# Patient Record
Sex: Female | Born: 1965 | Race: White | Marital: Married | Smoking: Never smoker
Health system: Southern US, Community
[De-identification: ages and names within clinical notes are randomized; demographics above are authoritative.]

## PROBLEM LIST (undated history)

## (undated) DIAGNOSIS — E119 Type 2 diabetes mellitus without complications: Secondary | ICD-10-CM

## (undated) DIAGNOSIS — M1990 Unspecified osteoarthritis, unspecified site: Secondary | ICD-10-CM

## (undated) DIAGNOSIS — I10 Essential (primary) hypertension: Secondary | ICD-10-CM

## (undated) DIAGNOSIS — G4730 Sleep apnea, unspecified: Secondary | ICD-10-CM

## (undated) DIAGNOSIS — K219 Gastro-esophageal reflux disease without esophagitis: Secondary | ICD-10-CM

## (undated) DIAGNOSIS — E785 Hyperlipidemia, unspecified: Secondary | ICD-10-CM

## (undated) DIAGNOSIS — R112 Nausea with vomiting, unspecified: Secondary | ICD-10-CM

## (undated) DIAGNOSIS — E079 Disorder of thyroid, unspecified: Secondary | ICD-10-CM

## (undated) DIAGNOSIS — Z87442 Personal history of urinary calculi: Secondary | ICD-10-CM

## (undated) DIAGNOSIS — Z9889 Other specified postprocedural states: Secondary | ICD-10-CM

## (undated) HISTORY — PX: BACK SURGERY: SHX140

## (undated) HISTORY — DX: Disorder of thyroid, unspecified: E07.9

## (undated) HISTORY — DX: Hyperlipidemia, unspecified: E78.5

## (undated) HISTORY — DX: Essential (primary) hypertension: I10

## (undated) HISTORY — DX: Gastro-esophageal reflux disease without esophagitis: K21.9

## (undated) HISTORY — DX: Type 2 diabetes mellitus without complications: E11.9

## (undated) HISTORY — DX: Unspecified osteoarthritis, unspecified site: M19.90

## (undated) HISTORY — PX: OTHER SURGICAL HISTORY: SHX169

## (undated) HISTORY — PX: TOTAL THYROIDECTOMY: SHX2547

---

## 1998-04-28 ENCOUNTER — Inpatient Hospital Stay (HOSPITAL_COMMUNITY): Admission: AD | Admit: 1998-04-28 | Discharge: 1998-04-28 | Payer: Self-pay | Admitting: Obstetrics & Gynecology

## 1998-04-28 ENCOUNTER — Encounter: Payer: Self-pay | Admitting: Obstetrics & Gynecology

## 1998-05-13 ENCOUNTER — Observation Stay (HOSPITAL_COMMUNITY): Admission: AD | Admit: 1998-05-13 | Discharge: 1998-05-14 | Payer: Self-pay | Admitting: Obstetrics & Gynecology

## 1998-05-17 ENCOUNTER — Encounter: Admission: RE | Admit: 1998-05-17 | Discharge: 1998-05-17 | Payer: Self-pay | Admitting: Sports Medicine

## 1998-05-24 ENCOUNTER — Encounter: Admission: RE | Admit: 1998-05-24 | Discharge: 1998-05-24 | Payer: Self-pay | Admitting: Family Medicine

## 1998-06-07 ENCOUNTER — Encounter: Admission: RE | Admit: 1998-06-07 | Discharge: 1998-06-07 | Payer: Self-pay | Admitting: Family Medicine

## 1998-06-15 ENCOUNTER — Ambulatory Visit (HOSPITAL_COMMUNITY): Admission: RE | Admit: 1998-06-15 | Discharge: 1998-06-15 | Payer: Self-pay | Admitting: Obstetrics

## 1998-06-24 ENCOUNTER — Encounter: Admission: RE | Admit: 1998-06-24 | Discharge: 1998-06-24 | Payer: Self-pay | Admitting: Family Medicine

## 1998-06-27 ENCOUNTER — Encounter: Admission: RE | Admit: 1998-06-27 | Discharge: 1998-06-27 | Payer: Self-pay | Admitting: Family Medicine

## 1998-07-01 ENCOUNTER — Encounter: Admission: RE | Admit: 1998-07-01 | Discharge: 1998-07-01 | Payer: Self-pay | Admitting: Family Medicine

## 1998-07-06 ENCOUNTER — Encounter: Admission: RE | Admit: 1998-07-06 | Discharge: 1998-07-06 | Payer: Self-pay | Admitting: Obstetrics

## 1998-07-06 ENCOUNTER — Inpatient Hospital Stay (HOSPITAL_COMMUNITY): Admission: AD | Admit: 1998-07-06 | Discharge: 1998-07-06 | Payer: Self-pay | Admitting: Obstetrics

## 1998-07-06 ENCOUNTER — Encounter: Payer: Self-pay | Admitting: Obstetrics

## 1998-07-06 ENCOUNTER — Encounter (HOSPITAL_COMMUNITY): Admission: RE | Admit: 1998-07-06 | Discharge: 1998-10-04 | Payer: Self-pay | Admitting: Obstetrics

## 1998-07-12 ENCOUNTER — Encounter: Admission: RE | Admit: 1998-07-12 | Discharge: 1998-07-12 | Payer: Self-pay | Admitting: Family Medicine

## 1998-07-13 ENCOUNTER — Encounter: Admission: RE | Admit: 1998-07-13 | Discharge: 1998-07-13 | Payer: Self-pay | Admitting: Obstetrics & Gynecology

## 1998-07-18 ENCOUNTER — Inpatient Hospital Stay (HOSPITAL_COMMUNITY): Admission: AD | Admit: 1998-07-18 | Discharge: 1998-07-21 | Payer: Self-pay | Admitting: Obstetrics & Gynecology

## 1998-07-25 ENCOUNTER — Inpatient Hospital Stay (HOSPITAL_COMMUNITY): Admission: AD | Admit: 1998-07-25 | Discharge: 1998-07-25 | Payer: Self-pay | Admitting: Obstetrics & Gynecology

## 1998-08-22 ENCOUNTER — Encounter (HOSPITAL_BASED_OUTPATIENT_CLINIC_OR_DEPARTMENT_OTHER): Payer: Self-pay | Admitting: General Surgery

## 1998-08-24 ENCOUNTER — Ambulatory Visit (HOSPITAL_COMMUNITY): Admission: RE | Admit: 1998-08-24 | Discharge: 1998-08-25 | Payer: Self-pay | Admitting: General Surgery

## 1998-08-24 ENCOUNTER — Encounter (HOSPITAL_BASED_OUTPATIENT_CLINIC_OR_DEPARTMENT_OTHER): Payer: Self-pay | Admitting: General Surgery

## 1998-09-06 ENCOUNTER — Encounter: Admission: RE | Admit: 1998-09-06 | Discharge: 1998-09-06 | Payer: Self-pay | Admitting: Obstetrics & Gynecology

## 1998-09-10 DIAGNOSIS — C801 Malignant (primary) neoplasm, unspecified: Secondary | ICD-10-CM

## 1998-09-10 HISTORY — PX: TOTAL THYROIDECTOMY: SHX2547

## 1998-09-10 HISTORY — PX: CHOLECYSTECTOMY: SHX55

## 1998-09-10 HISTORY — DX: Malignant (primary) neoplasm, unspecified: C80.1

## 2000-02-22 ENCOUNTER — Encounter: Payer: Self-pay | Admitting: *Deleted

## 2000-02-22 ENCOUNTER — Ambulatory Visit (HOSPITAL_COMMUNITY): Admission: RE | Admit: 2000-02-22 | Discharge: 2000-02-22 | Payer: Self-pay | Admitting: *Deleted

## 2000-02-27 ENCOUNTER — Encounter: Payer: Self-pay | Admitting: *Deleted

## 2000-02-27 ENCOUNTER — Ambulatory Visit (HOSPITAL_COMMUNITY): Admission: RE | Admit: 2000-02-27 | Discharge: 2000-02-27 | Payer: Self-pay | Admitting: *Deleted

## 2000-04-19 ENCOUNTER — Observation Stay (HOSPITAL_COMMUNITY): Admission: RE | Admit: 2000-04-19 | Discharge: 2000-04-20 | Payer: Self-pay | Admitting: Surgery

## 2000-05-14 ENCOUNTER — Ambulatory Visit (HOSPITAL_COMMUNITY): Admission: RE | Admit: 2000-05-14 | Discharge: 2000-05-14 | Payer: Self-pay | Admitting: *Deleted

## 2000-05-19 ENCOUNTER — Ambulatory Visit (HOSPITAL_COMMUNITY): Admission: RE | Admit: 2000-05-19 | Discharge: 2000-05-19 | Payer: Self-pay | Admitting: *Deleted

## 2001-03-27 ENCOUNTER — Other Ambulatory Visit: Admission: RE | Admit: 2001-03-27 | Discharge: 2001-03-27 | Payer: Self-pay | Admitting: Obstetrics & Gynecology

## 2001-07-21 ENCOUNTER — Encounter: Payer: Self-pay | Admitting: Endocrinology

## 2001-07-21 ENCOUNTER — Ambulatory Visit: Admission: RE | Admit: 2001-07-21 | Discharge: 2001-07-21 | Payer: Self-pay | Admitting: Endocrinology

## 2001-07-25 ENCOUNTER — Ambulatory Visit (HOSPITAL_COMMUNITY): Admission: RE | Admit: 2001-07-25 | Discharge: 2001-07-25 | Payer: Self-pay | Admitting: Endocrinology

## 2002-03-02 ENCOUNTER — Emergency Department (HOSPITAL_COMMUNITY): Admission: EM | Admit: 2002-03-02 | Discharge: 2002-03-02 | Payer: Self-pay | Admitting: Emergency Medicine

## 2002-03-02 ENCOUNTER — Encounter: Payer: Self-pay | Admitting: Emergency Medicine

## 2003-07-19 ENCOUNTER — Encounter (HOSPITAL_COMMUNITY): Admission: RE | Admit: 2003-07-19 | Discharge: 2003-10-17 | Payer: Self-pay | Admitting: Endocrinology

## 2003-07-23 IMAGING — NM NM [ID] THYROID CANCER METS WHOLE BODY
12 series · 12 of 12 positions shown · non-contrast
Comparison: none

CLINICAL DATA: Previous treatment for thyroid cancer [DATE].  
 NUCLEAR MEDICINE WHOLE BODY I 131 SCAN
 The patient was given a 4 millicuries [G7] capsule PO.  The patient was given 0.9 mg Thyrogen-IM x 2, [DATE] and [DATE].  The patient has also undergone previous total thyroidectomy.
 Other than a small amount of stomach and salivary gland activity, there is no evidence for uptake of the [G7].  There is no evidence for recurrent or metastatic disease.
 IMPRESSION
 Negative [G7] scan, status post total thyroidectomy as well as previous [G7] treatment.

[Series 1: is (id) statics · 2.47mm/px · 1 of 1 slices shown (1 of 12)]
[im 1/1]
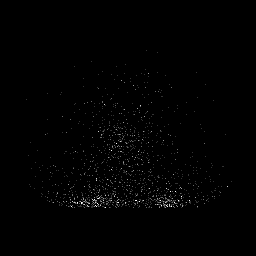

[Series 1: is (id) statics · 2.47mm/px · 1 of 1 slices shown (2 of 12)]
[im 1/1]
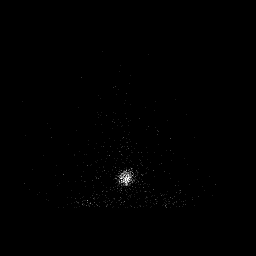

[Series 1: is (id) statics · 2.47mm/px · 1 of 1 slices shown (3 of 12)]
[im 1/1]
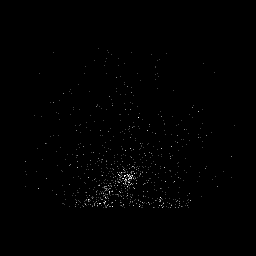

[Series 1: is (id) statics · 2.48mm/px · 1 of 1 slices shown (4 of 12)]
[im 1/1]
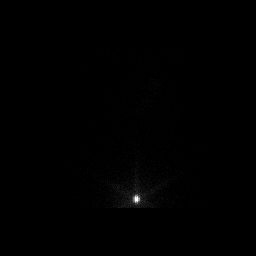

[Series 1: is (id) statics · 2.48mm/px · 1 of 1 slices shown (5 of 12)]
[im 1/1]
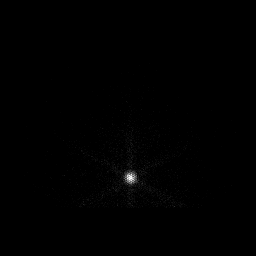

[Series 1: is (id) statics · 2.48mm/px · 1 of 1 slices shown (6 of 12)]
[im 1/1  full-range]
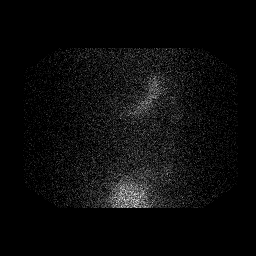

[Series 1: is (id) statics · 2.47mm/px · 1 of 1 slices shown (7 of 12)]
[im 1/1  full-range]
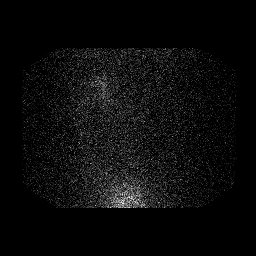

[Series 1: is (id) statics · 2.48mm/px · 1 of 1 slices shown (8 of 12)]
[im 1/1  full-range]
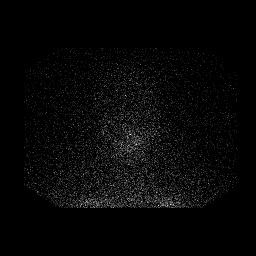

[Series 1: is (id) statics · 2.48mm/px · 1 of 1 slices shown (9 of 12)]
[im 1/1]
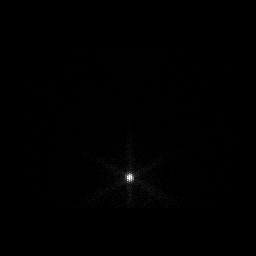

[Series 1: is (id) statics · 2.47mm/px · 1 of 1 slices shown (10 of 12)]
[im 1/1]
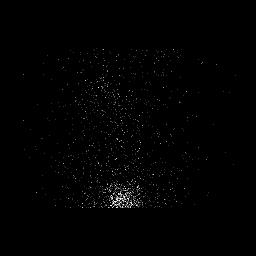

[Series 1: is (id) statics · 2.47mm/px · 1 of 1 slices shown (11 of 12)]
[im 1/1  full-range]
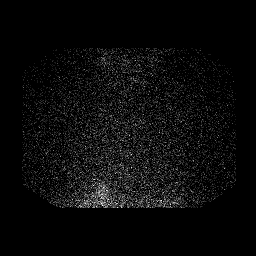

[Series 1: is (id) statics · 2.48mm/px · 1 of 1 slices shown (12 of 12)]
[im 1/1  full-range]
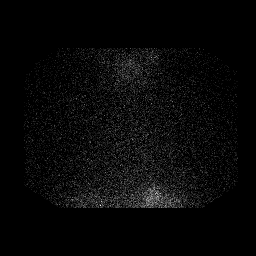

[12 of 12 positions shown; findings below may reference images not displayed]

## 2003-10-22 ENCOUNTER — Encounter: Admission: RE | Admit: 2003-10-22 | Discharge: 2003-10-22 | Payer: Self-pay | Admitting: Endocrinology

## 2003-10-22 IMAGING — CT CT PELVIS W/ CM
1 series · 15 of 32 positions shown, 19 images · IV contrast (omnipaque)
Comparison: none

CLINICAL DATA: Back and pelvic pain, hypertension.  Microhematuria.  Post cholecystectomy, tubal ligation.  Contrast code:  none.
 ABDOMINAL CT PRE AND POST CONTRAST ? [DATE] 
 Following oral contrast and post IV administration of 100 cc Omnipaque 300, multidetector spiral axial images were obtained through the abdomen and comparison made with previous abdominal ultrasound report from [HOSPITAL] of [DATE].  On pre IV contrast enhanced images, 1 mm upper pole right (image 14) with two adjacent 1 mm mid to lower pole right (image 16) and two respective 2 mm calculi are seen at the lower pole right (images 20 and 21).  At the left kidney, 1 mm mid (image 13) and two 2 mm lower pole left renal calculi (image 18).  No associated hydronephrosis is seen.  Since previous ultrasound report, patient is post cholecystectomy with no dilated biliary tract.  Bases of the lungs are clear.  1.1 cm accessory spleen is seen (image 50).  Remaining abdominal organs appear normal with no inflammation, free fluid, nor adenopathy.  Anatomic variant retroaortic left renal vein is noted.  On delayed renal imaging, 6 mm mid to lower pole right renal cortical cystic focus is seen (image 137).  The remaining abdominal organs are otherwise normal.  
 IMPRESSION
 Since previous [HOSPITAL] abdominal ultrasound report of [DATE]:
 [DATE].  Interval tiny bilateral nonobstructing renal calculi.
 2.  Post cholecystectomy.
 3.  Small right renal cortical cysts.
 4.  Otherwise negative.
 PELVIC CT WITH CONTRAST ? [DATE] 
 Routine spiral CT of the pelvis was performed. Omnipaque intravenous contrast and oral contrast were administered.

[Series 2: routine abdomen · axial · 0.70mm/px · z∈[-333,-38]mm · 15 of 134 slices shown, 19 images]
[im 9/134  soft-tissue]
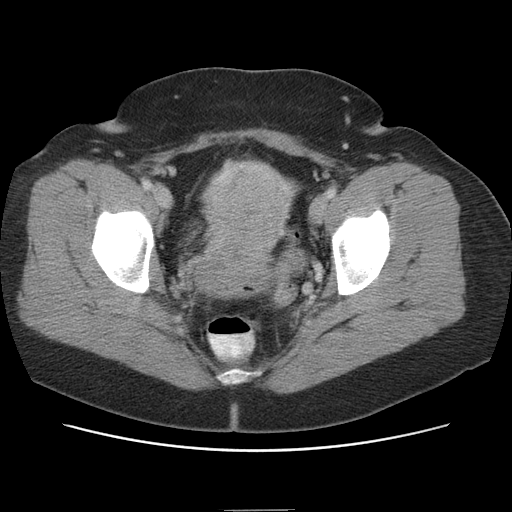
[im 9/134  bone]
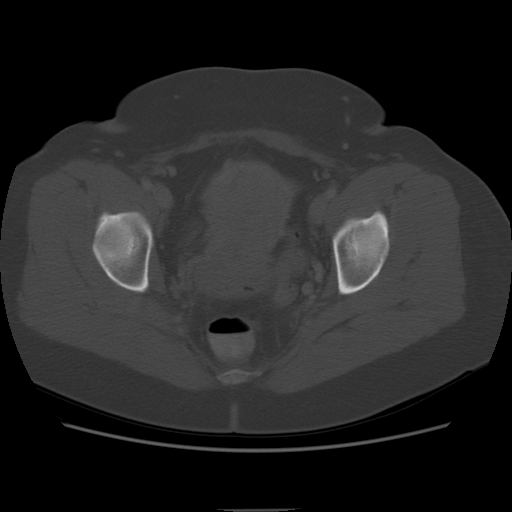
[im 18/134  soft-tissue]
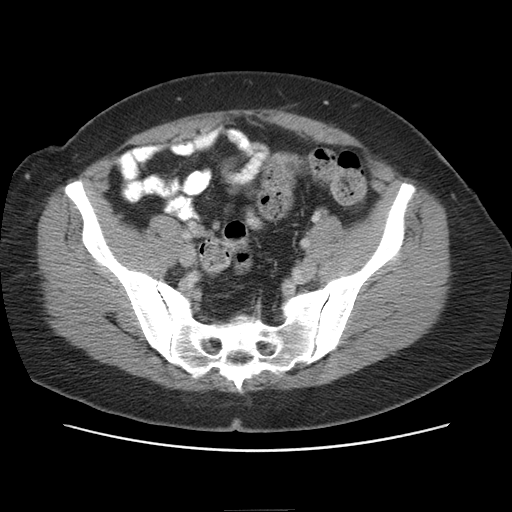
[im 26/134  soft-tissue]
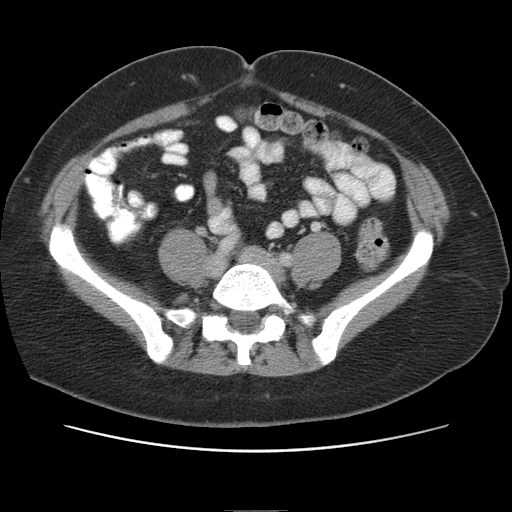
[im 39/134  soft-tissue]
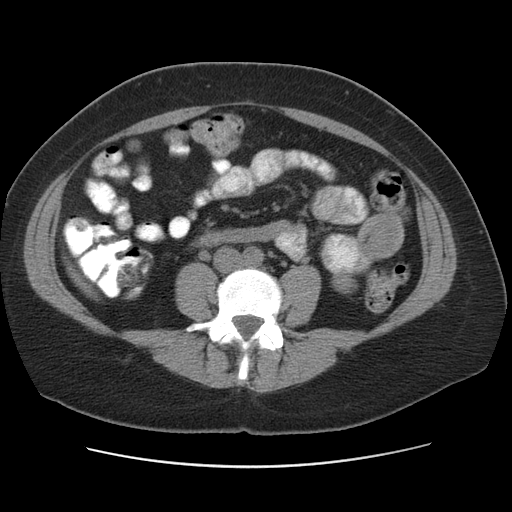
[im 48/134  soft-tissue]
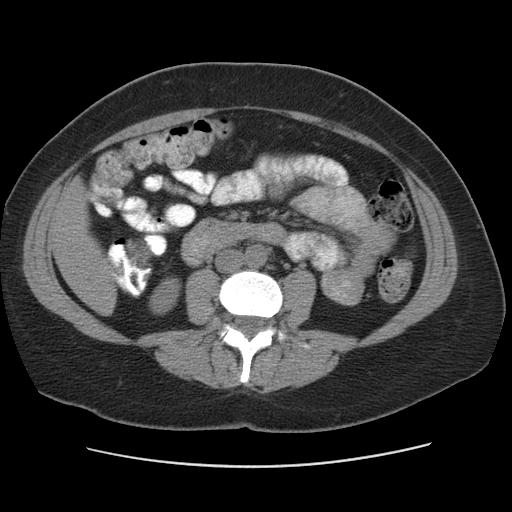
[im 56/134  soft-tissue]
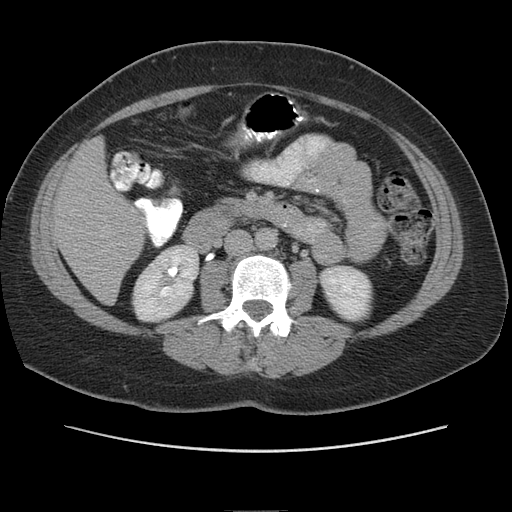
[im 69/134  soft-tissue]
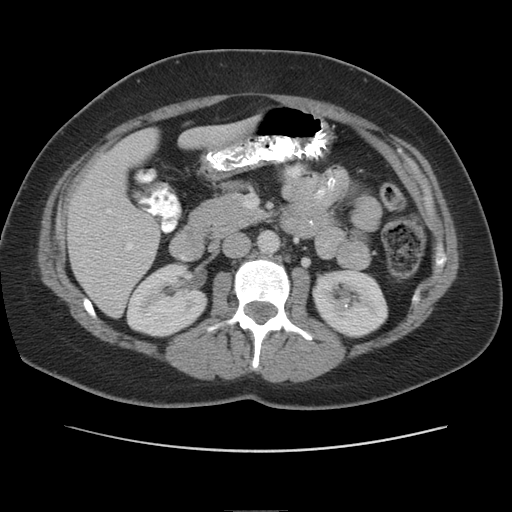
[im 78/134  soft-tissue]
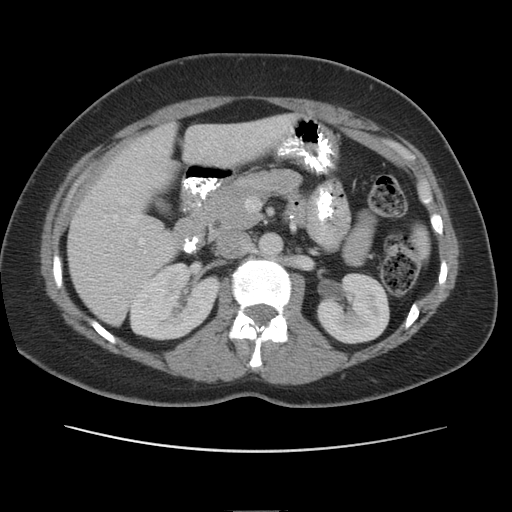
[im 86/134  soft-tissue]
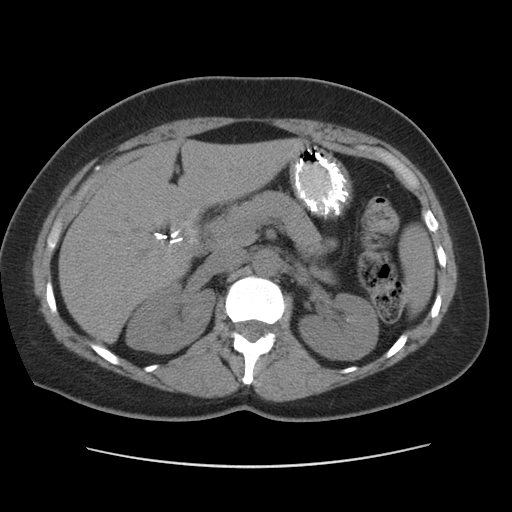
[im 86/134  bone]
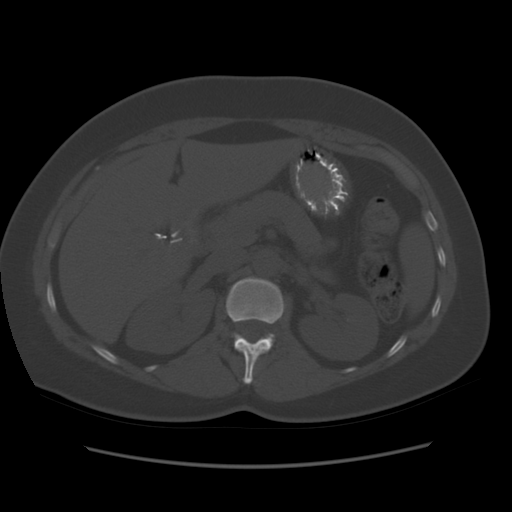
[im 95/134  soft-tissue]
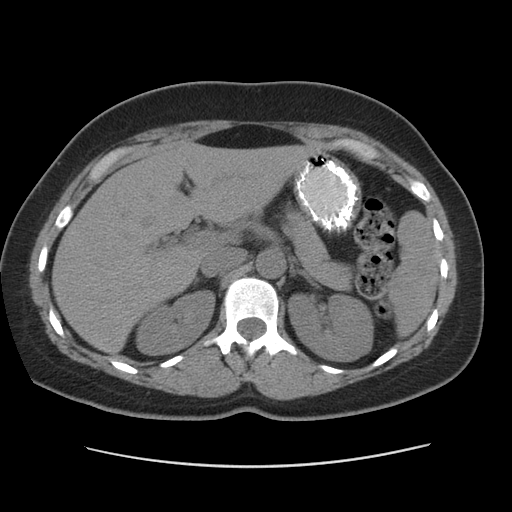
[im 108/134  soft-tissue]
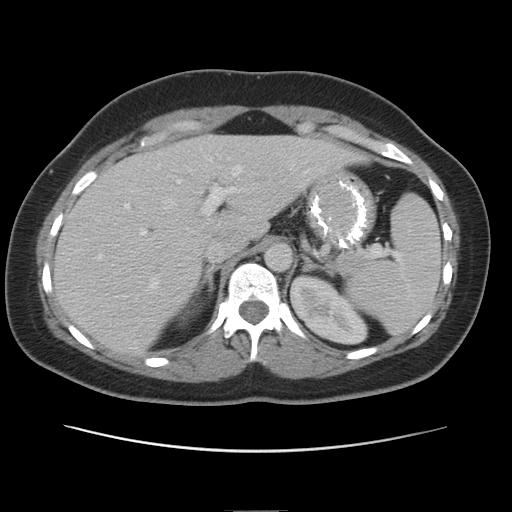
[im 116/134  soft-tissue]
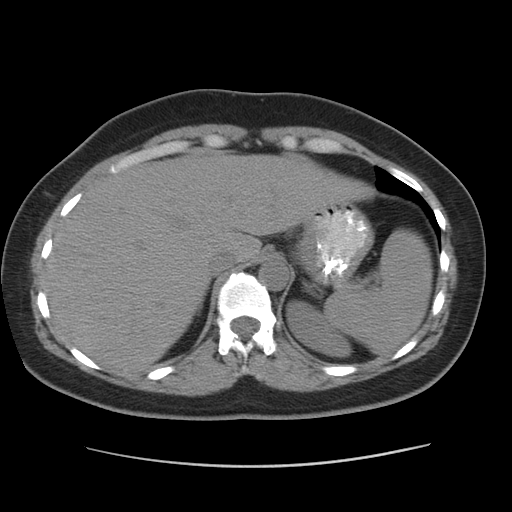
[im 116/134  lung]
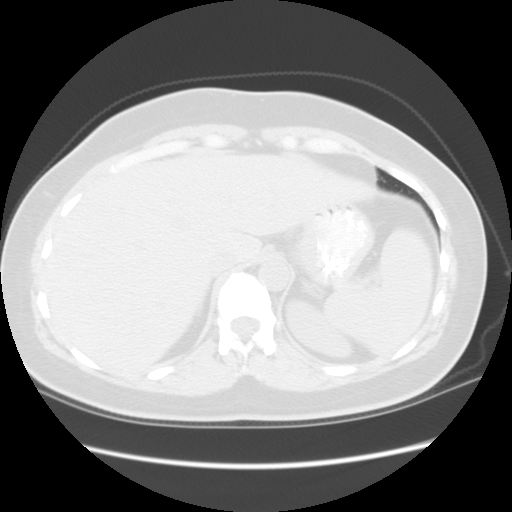
[im 121/134  lung]
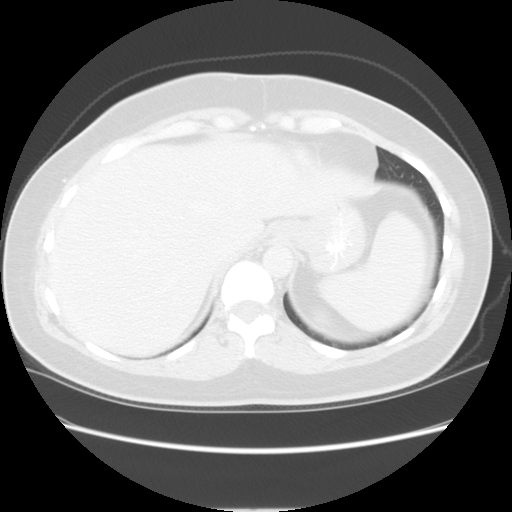
[im 125/134  soft-tissue]
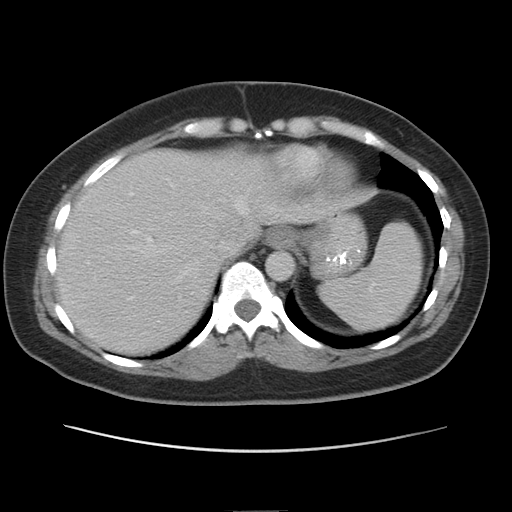
[im 125/134  lung]
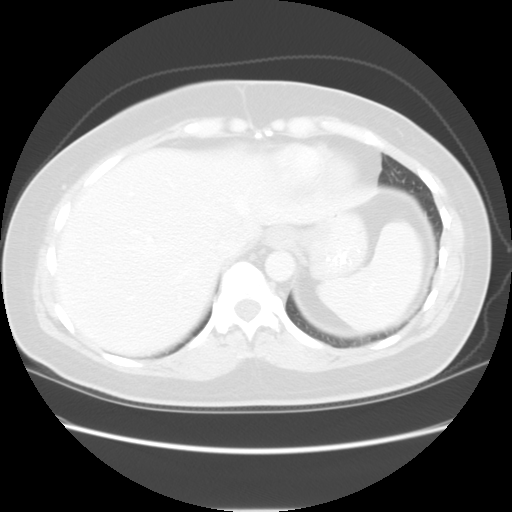
[im 129/134  lung]
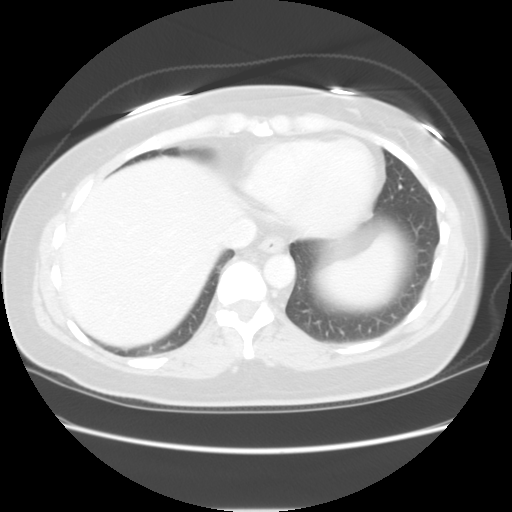

[15 of 32 positions shown; findings below may reference images not displayed]

The pelvic structures are normal in appearance. There is no evidence of masses, adenopathy, inflammatory process, or abnormal fluid collections.   Moderate retained colonic feces may represent constipation without colitis nor gastrointestinal obstruction.

 IMPRESSION
 1.  Possible constipation.
 2.  Otherwise normal pelvis CT.

## 2009-09-10 HISTORY — PX: APPENDECTOMY: SHX54

## 2010-09-30 ENCOUNTER — Encounter (HOSPITAL_BASED_OUTPATIENT_CLINIC_OR_DEPARTMENT_OTHER): Payer: Self-pay | Admitting: Internal Medicine

## 2010-09-30 ENCOUNTER — Encounter: Payer: Self-pay | Admitting: Endocrinology

## 2010-12-14 ENCOUNTER — Encounter: Payer: Self-pay | Admitting: Nurse Practitioner

## 2010-12-14 DIAGNOSIS — E039 Hypothyroidism, unspecified: Secondary | ICD-10-CM

## 2010-12-14 DIAGNOSIS — F988 Other specified behavioral and emotional disorders with onset usually occurring in childhood and adolescence: Secondary | ICD-10-CM

## 2012-12-15 ENCOUNTER — Other Ambulatory Visit: Payer: Self-pay | Admitting: *Deleted

## 2012-12-15 MED ORDER — LISINOPRIL 10 MG PO TABS: 10.0000 mg | ORAL_TABLET | Freq: Every day | ORAL | Status: DC

## 2013-01-12 ENCOUNTER — Other Ambulatory Visit: Payer: Self-pay | Admitting: Nurse Practitioner

## 2013-01-26 ENCOUNTER — Other Ambulatory Visit: Payer: Self-pay

## 2013-01-26 MED ORDER — CLONIDINE HCL 0.1 MG PO TABS: 0.1000 mg | ORAL_TABLET | Freq: Three times a day (TID) | ORAL | Status: DC

## 2013-01-26 NOTE — Telephone Encounter (Signed)
Last seen 11/13   Phone in if approved and have nurse call patient

## 2013-02-18 ENCOUNTER — Other Ambulatory Visit: Payer: Self-pay | Admitting: Nurse Practitioner

## 2013-02-25 ENCOUNTER — Other Ambulatory Visit: Payer: Self-pay | Admitting: Nurse Practitioner

## 2013-03-23 ENCOUNTER — Other Ambulatory Visit: Payer: Self-pay | Admitting: Nurse Practitioner

## 2013-03-23 NOTE — Telephone Encounter (Signed)
This is okay for one-month, but please have patient make an appointment to be seen for future refill and to check lab work.

## 2013-03-23 NOTE — Telephone Encounter (Signed)
LAST OV 11/13. NTBS. 

## 2013-03-30 ENCOUNTER — Other Ambulatory Visit: Payer: Self-pay | Admitting: Nurse Practitioner

## 2013-04-01 ENCOUNTER — Other Ambulatory Visit: Payer: Self-pay | Admitting: Nurse Practitioner

## 2013-04-01 NOTE — Telephone Encounter (Signed)
LAST OV 11/13. NTBS. 

## 2013-04-01 NOTE — Telephone Encounter (Signed)
Last thyroid labs 03/20/12   [facility]

## 2013-04-02 ENCOUNTER — Other Ambulatory Visit: Payer: Self-pay

## 2013-04-02 MED ORDER — LEVOTHYROXINE SODIUM 200 MCG PO TABS: 200.0000 ug | ORAL_TABLET | Freq: Every day | ORAL | Status: DC

## 2013-04-02 NOTE — Telephone Encounter (Signed)
Last thyroid levels 03/20/12   Ann Peterson

## 2013-04-17 ENCOUNTER — Ambulatory Visit (INDEPENDENT_AMBULATORY_CARE_PROVIDER_SITE_OTHER): Payer: BC Managed Care – PPO | Admitting: Family Medicine

## 2013-04-17 ENCOUNTER — Encounter: Payer: Self-pay | Admitting: Family Medicine

## 2013-04-17 VITALS — BP 128/73 | HR 74 | Temp 98.2°F | Ht 63.25 in | Wt 191.0 lb

## 2013-04-17 DIAGNOSIS — E039 Hypothyroidism, unspecified: Secondary | ICD-10-CM

## 2013-04-17 DIAGNOSIS — Z0000 Encounter for general adult medical examination without abnormal findings: Secondary | ICD-10-CM

## 2013-04-17 DIAGNOSIS — I10 Essential (primary) hypertension: Secondary | ICD-10-CM

## 2013-04-17 LAB — POCT CBC
Granulocyte percent: 71.1 %G (ref 37–80)
HCT, POC: 36 % — AB (ref 37.7–47.9)
Hemoglobin: 12.2 g/dL (ref 12.2–16.2)
Lymph, poc: 2.1 (ref 0.6–3.4)
MCH, POC: 29.1 pg (ref 27–31.2)
MCHC: 33.9 g/dL (ref 31.8–35.4)
MCV: 86.1 fL (ref 80–97)
MPV: 7.4 fL (ref 0–99.8)
POC Granulocyte: 6.5 (ref 2–6.9)
POC LYMPH PERCENT: 23.1 %L (ref 10–50)
Platelet Count, POC: 338 10*3/uL (ref 142–424)
RBC: 4.2 M/uL (ref 4.04–5.48)
RDW, POC: 14.3 %
WBC: 9.2 10*3/uL (ref 4.6–10.2)

## 2013-04-17 MED ORDER — CLONIDINE HCL 0.1 MG PO TABS: 0.1000 mg | ORAL_TABLET | Freq: Two times a day (BID) | ORAL | Status: DC

## 2013-04-17 MED ORDER — LEVOTHYROXINE SODIUM 200 MCG PO TABS: 200.0000 ug | ORAL_TABLET | Freq: Every day | ORAL | Status: DC

## 2013-04-17 MED ORDER — LISINOPRIL 10 MG PO TABS: 10.0000 mg | ORAL_TABLET | Freq: Every day | ORAL | Status: DC

## 2013-04-17 NOTE — Progress Notes (Signed)
  Subjective:    Patient ID: Ann Peterson, female    DOB: May 20, 1966, 47 y.o.   MRN: [id number]  HPI This 47 y.o. female presents for evaluation of hypertension, hypothyroidism, And follow up.  She states she gets some attention problems and the clonidine  Helps out.  She has hx of throid cancer in 2000 and has had to get thyroid resection And radiation.  She sees endocrinologist every year.  She is in Nursing school.   Review of Systems No chest pain, SOB, HA, dizziness, vision change, N/V, diarrhea, constipation, dysuria, urinary urgency or frequency, myalgias, arthralgias or rash.     Objective:   Physical Exam  Vital signs noted  Well developed well nourished female.  HEENT - Head atraumatic Normocephalic                Eyes - PERRLA, Conjuctiva - clear Sclera- Clear EOMI                Ears - EAC's Wnl TM's Wnl Gross Hearing WNL                Nose - Nares patent                 Throat - oropharanx wnl Respiratory - Lungs CTA bilateral Cardiac - RRR S1 and S2 without murmur GI - Abdomen soft Nontender and bowel sounds active x 4 Extremities - No edema. Neuro - Grossly intact.      Assessment & Plan:  Unspecified hypothyroidism - Plan: cloNIDine (CATAPRES) 0.1 MG tablet, levothyroxine (SYNTHROID, LEVOTHROID) 200 MCG tablet, lisinopril (PRINIVIL,ZESTRIL) 10 MG tablet, POCT CBC, CMP14+EGFR, Lipid panel, Thyroid Panel With TSH  Essential hypertension, benign - Plan: cloNIDine (CATAPRES) 0.1 MG tablet, levothyroxine (SYNTHROID, LEVOTHROID) 200 MCG tablet, lisinopril (PRINIVIL,ZESTRIL) 10 MG tablet, POCT CBC, CMP14+EGFR, Lipid panel, Thyroid Panel With TSH  Routine general medical examination at a health care facility - Plan: POCT CBC, CMP14+EGFR, Lipid panel, Thyroid Panel With TSH

## 2013-04-17 NOTE — Patient Instructions (Signed)

## 2013-04-19 LAB — CMP14+EGFR
ALT: 15 IU/L (ref 0–32)
AST: 12 IU/L (ref 0–40)
Albumin/Globulin Ratio: 2.1 (ref 1.1–2.5)
Albumin: 4.4 g/dL (ref 3.5–5.5)
Alkaline Phosphatase: 71 IU/L (ref 39–117)
BUN/Creatinine Ratio: 18 (ref 9–23)
BUN: 12 mg/dL (ref 6–24)
CO2: 22 mmol/L (ref 18–29)
Calcium: 9.3 mg/dL (ref 8.7–10.2)
Chloride: 100 mmol/L (ref 97–108)
Creatinine, Ser: 0.67 mg/dL (ref 0.57–1.00)
GFR calc Af Amer: 122 mL/min/{1.73_m2} (ref >59)
GFR calc non Af Amer: 106 mL/min/{1.73_m2} (ref >59)
Globulin, Total: 2.1 g/dL (ref 1.5–4.5)
Glucose: 84 mg/dL (ref 65–99)
Potassium: 4.2 mmol/L (ref 3.5–5.2)
Sodium: 137 mmol/L (ref 134–144)
Total Bilirubin: 0.2 mg/dL (ref 0.0–1.2)
Total Protein: 6.5 g/dL (ref 6.0–8.5)

## 2013-04-19 LAB — THYROID PANEL WITH TSH
Free Thyroxine Index: 3.6 (ref 1.2–4.9)
T3 Uptake Ratio: 31 % (ref 24–39)
T4, Total: 11.5 ug/dL (ref 4.5–12.0)
TSH: 1.14 u[IU]/mL (ref 0.450–4.500)

## 2013-04-19 LAB — LIPID PANEL
Chol/HDL Ratio: 7.4 ratio units — ABNORMAL HIGH (ref 0.0–4.4)
Cholesterol, Total: 228 mg/dL — ABNORMAL HIGH (ref 100–199)
HDL: 31 mg/dL — ABNORMAL LOW (ref >39)
Triglycerides: 759 mg/dL (ref 0–149)

## 2013-04-21 ENCOUNTER — Other Ambulatory Visit: Payer: Self-pay | Admitting: Family Medicine

## 2013-04-21 DIAGNOSIS — E782 Mixed hyperlipidemia: Secondary | ICD-10-CM

## 2013-04-21 MED ORDER — GEMFIBROZIL 600 MG PO TABS: 600.0000 mg | ORAL_TABLET | Freq: Two times a day (BID) | ORAL | Status: DC

## 2013-05-04 ENCOUNTER — Telehealth: Payer: Self-pay | Admitting: Family Medicine

## 2013-05-04 ENCOUNTER — Other Ambulatory Visit: Payer: Self-pay | Admitting: Family Medicine

## 2013-05-04 DIAGNOSIS — E785 Hyperlipidemia, unspecified: Secondary | ICD-10-CM

## 2013-05-04 MED ORDER — FENOFIBRATE 145 MG PO TABS: 145.0000 mg | ORAL_TABLET | Freq: Every day | ORAL | Status: DC

## 2013-05-04 NOTE — Telephone Encounter (Signed)
I sent fenofibrate to pharm and have patient stop lopid

## 2013-05-04 NOTE — Telephone Encounter (Signed)
Patient aware.

## 2013-05-05 ENCOUNTER — Other Ambulatory Visit: Payer: Self-pay | Admitting: Family Medicine

## 2013-05-05 NOTE — Telephone Encounter (Signed)
Tell her to stop lopid and follow up with [facility] pharm D for tx of elevated trigs.

## 2013-05-12 NOTE — Telephone Encounter (Signed)
Pt notifed and appt made with the pharmascist

## 2013-09-09 ENCOUNTER — Encounter: Payer: Self-pay | Admitting: Family Medicine

## 2013-09-09 ENCOUNTER — Telehealth: Payer: Self-pay | Admitting: Nurse Practitioner

## 2013-09-09 ENCOUNTER — Ambulatory Visit (INDEPENDENT_AMBULATORY_CARE_PROVIDER_SITE_OTHER): Payer: BC Managed Care – PPO | Admitting: Family Medicine

## 2013-09-09 VITALS — BP 123/72 | HR 69 | Temp 97.5°F | Ht 63.0 in | Wt 196.2 lb

## 2013-09-09 DIAGNOSIS — J069 Acute upper respiratory infection, unspecified: Secondary | ICD-10-CM

## 2013-09-09 MED ORDER — HYDROCODONE-HOMATROPINE 5-1.5 MG/5ML PO SYRP: 5.0000 mL | ORAL_SOLUTION | Freq: Three times a day (TID) | ORAL | Status: DC | PRN

## 2013-09-09 MED ORDER — AMOXICILLIN 875 MG PO TABS: 875.0000 mg | ORAL_TABLET | Freq: Two times a day (BID) | ORAL | Status: DC

## 2013-09-09 MED ORDER — METHYLPREDNISOLONE ACETATE 80 MG/ML IJ SUSP: 80.0000 mg | Freq: Once | INTRAMUSCULAR | Status: DC

## 2013-09-09 NOTE — Telephone Encounter (Signed)
appt today at 1000 with bill

## 2013-09-11 ENCOUNTER — Telehealth: Payer: Self-pay | Admitting: Family Medicine

## 2013-09-11 NOTE — Progress Notes (Signed)
   Subjective:    Patient ID: Ann Peterson, female    DOB: 1966/02/04, 48 y.o.   MRN: [id number]  HPI  This 48 y.o. female presents for evaluation of URI sx's for 3 days.  Review of Systems    No chest pain, SOB, HA, dizziness, vision change, N/V, diarrhea, constipation, dysuria, urinary urgency or frequency, myalgias, arthralgias or rash.  Objective:   Physical Exam Vital signs noted  Well developed well nourished female.  HEENT - Head atraumatic Normocephalic                Eyes - PERRLA, Conjuctiva - clear Sclera- Clear EOMI                Ears - EAC's Wnl TM's Wnl Gross Hearing WNL                Nose - Nares patent                 Throat - oropharanx wnl Respiratory - Lungs CTA bilateral Cardiac - RRR S1 and S2 without murmur GI - Abdomen soft Nontender and bowel sounds active x 4 Extremities - No edema. Neuro - Grossly intact.       Assessment & Plan:  URI (upper respiratory infection) - Plan: amoxicillin (AMOXIL) 875 MG tablet, methylPREDNISolone acetate (DEPO-MEDROL) injection 80 mg, HYDROcodone-homatropine (HYCODAN) 5-1.5 MG/5ML syrup Push po fluids, rest, tylenol and motrin otc prn as directed for fever, arthralgias, and myalgias.  Follow up prn if sx's continue or persist.  Lysbeth Penner FNP

## 2013-09-15 NOTE — Telephone Encounter (Signed)
Recently took antibiotic and now has symptoms of yeast infection. She is requesting a script for diflucan.

## 2013-09-16 ENCOUNTER — Other Ambulatory Visit: Payer: Self-pay | Admitting: Family Medicine

## 2013-09-16 MED ORDER — FLUCONAZOLE 150 MG PO TABS: 150.0000 mg | ORAL_TABLET | Freq: Once | ORAL | Status: DC

## 2013-09-16 NOTE — Telephone Encounter (Signed)
Diflucan sent to pharm 

## 2013-10-05 ENCOUNTER — Encounter: Payer: Self-pay | Admitting: Family Medicine

## 2013-10-05 ENCOUNTER — Ambulatory Visit (INDEPENDENT_AMBULATORY_CARE_PROVIDER_SITE_OTHER): Payer: BC Managed Care – PPO

## 2013-10-05 ENCOUNTER — Telehealth: Payer: Self-pay | Admitting: Family Medicine

## 2013-10-05 ENCOUNTER — Encounter (INDEPENDENT_AMBULATORY_CARE_PROVIDER_SITE_OTHER): Payer: Self-pay

## 2013-10-05 ENCOUNTER — Ambulatory Visit (INDEPENDENT_AMBULATORY_CARE_PROVIDER_SITE_OTHER): Payer: BC Managed Care – PPO | Admitting: Family Medicine

## 2013-10-05 VITALS — BP 139/81 | HR 86 | Temp 99.9°F | Ht 63.0 in | Wt 197.0 lb

## 2013-10-05 DIAGNOSIS — R350 Frequency of micturition: Secondary | ICD-10-CM

## 2013-10-05 DIAGNOSIS — IMO0001 Reserved for inherently not codable concepts without codable children: Secondary | ICD-10-CM

## 2013-10-05 DIAGNOSIS — R319 Hematuria, unspecified: Secondary | ICD-10-CM

## 2013-10-05 DIAGNOSIS — N23 Unspecified renal colic: Secondary | ICD-10-CM

## 2013-10-05 LAB — POCT URINALYSIS DIPSTICK
Bilirubin, UA: NEGATIVE
Glucose, UA: NEGATIVE
Ketones, UA: NEGATIVE
Nitrite, UA: NEGATIVE
Protein, UA: NEGATIVE
Spec Grav, UA: >=1.03
Urobilinogen, UA: NEGATIVE
pH, UA: 5

## 2013-10-05 LAB — POCT UA - MICROSCOPIC ONLY
Bacteria, U Microscopic: NEGATIVE
Casts, Ur, LPF, POC: NEGATIVE
Crystals, Ur, HPF, POC: NEGATIVE
Mucus, UA: NEGATIVE
Yeast, UA: NEGATIVE

## 2013-10-05 LAB — POCT CBC
Granulocyte percent: 75.1 %G (ref 37–80)
HCT, POC: 38.3 % (ref 37.7–47.9)
Hemoglobin: 12.2 g/dL (ref 12.2–16.2)
Lymph, poc: 1.9 (ref 0.6–3.4)
MCH, POC: 26.6 pg — AB (ref 27–31.2)
MCHC: 31.8 g/dL (ref 31.8–35.4)
MCV: 83.6 fL (ref 80–97)
MPV: 7.7 fL (ref 0–99.8)
POC Granulocyte: 6.9 (ref 2–6.9)
POC LYMPH PERCENT: 20.6 %L (ref 10–50)
Platelet Count, POC: 317 10*3/uL (ref 142–424)
RBC: 4.6 M/uL (ref 4.04–5.48)
RDW, POC: 16.4 %
WBC: 9.2 10*3/uL (ref 4.6–10.2)

## 2013-10-05 IMAGING — CR DG ABDOMEN 1V
1 series · 1 of 1 positions shown · non-contrast
Comparison: None

CLINICAL DATA: Urinary frequency and hematuria, left renal call
ache

EXAM:
ABDOMEN - 1 VIEW

[view not recorded]
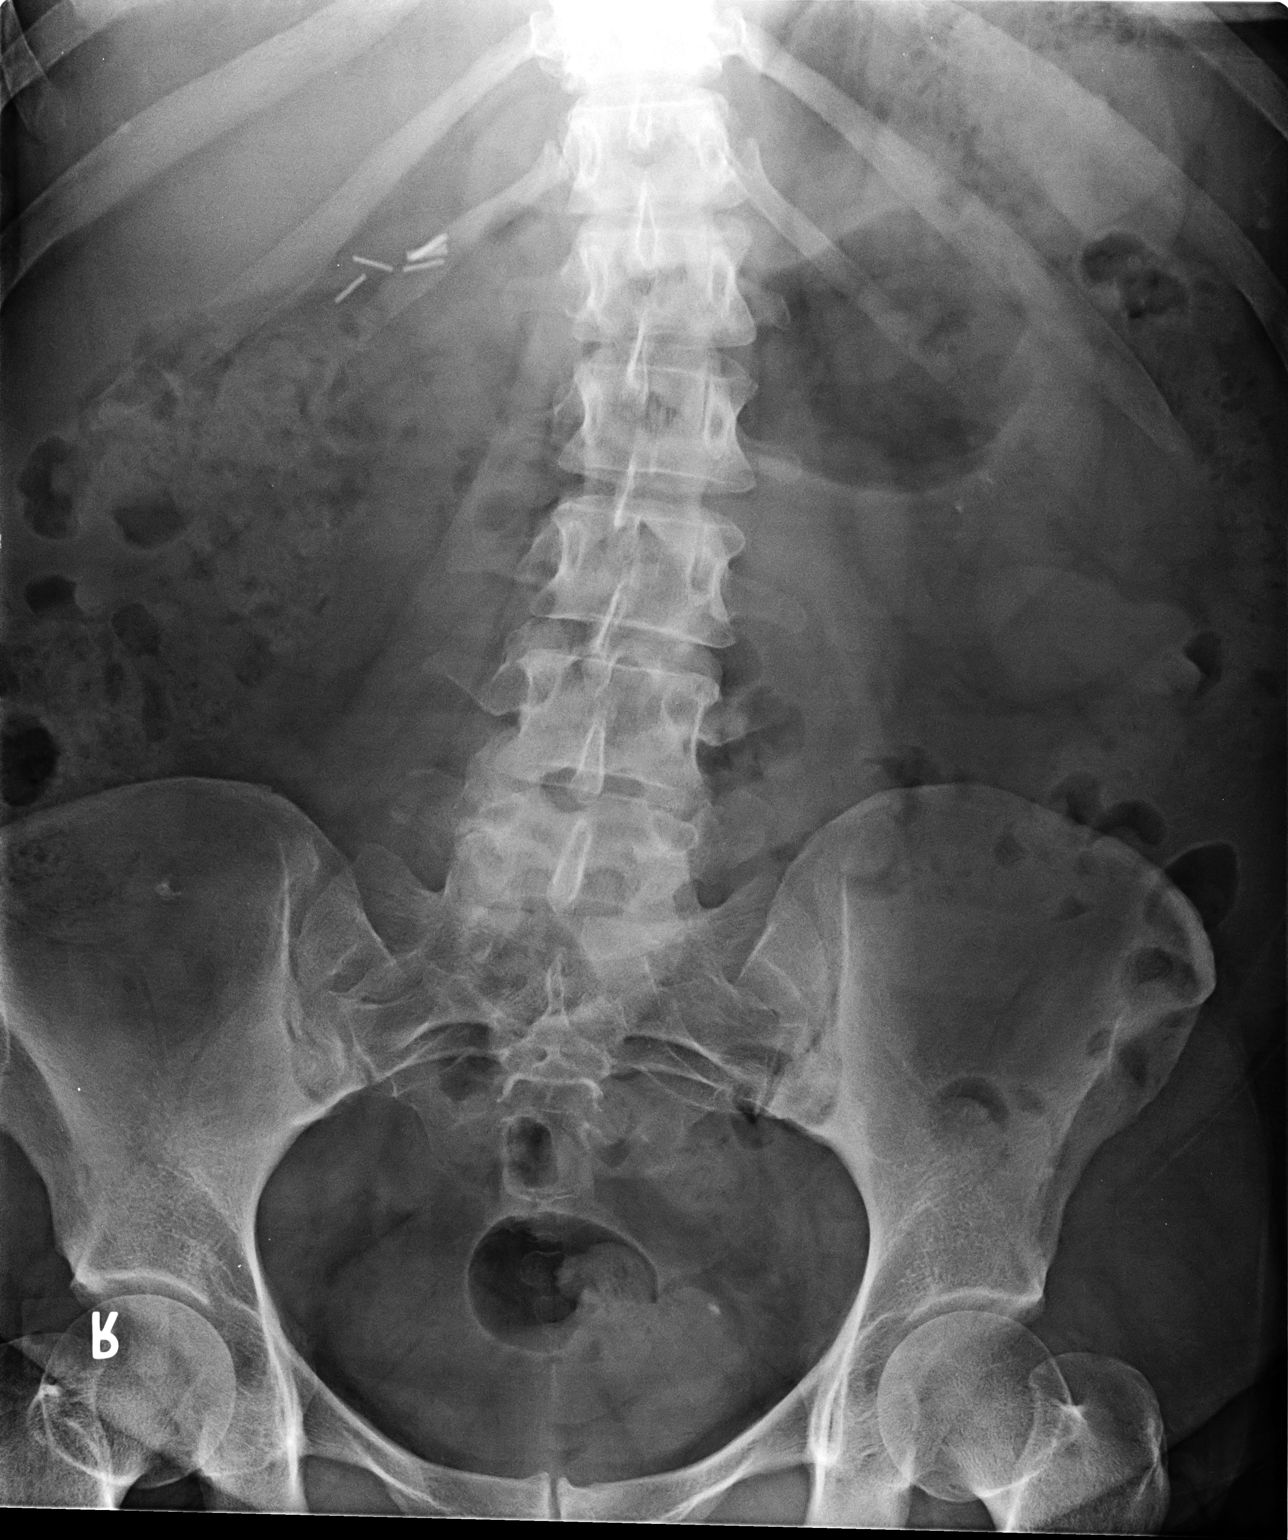

[1 of 1 positions shown; findings below may reference images not displayed]

FINDINGS: Multiple tiny calcifications at inferior pole left kidney.

Additional calcification in left pelvis 4 mm diameter could
represent a distal left ureteral calculus or a vascular
calcification.

No definite right side calculi identified.

Surgical clips right upper quadrant question cholecystectomy.

Broad-based levoconvex thoracolumbar scoliosis.

Bowel gas pattern normal.
IMPRESSION: 4 mm left pelvic calcification question distal left ureteral
calculus versus vascular calcification.

Tiny nonobstructing left renal calculi.

If patient's symptoms persist consider CT imaging to better
evaluate.

## 2013-10-05 MED ORDER — SULFAMETHOXAZOLE-TMP DS 800-160 MG PO TABS: 1.0000 | ORAL_TABLET | Freq: Two times a day (BID) | ORAL | Status: DC

## 2013-10-05 MED ORDER — HYDROCODONE-ACETAMINOPHEN 5-325 MG PO TABS: 1.0000 | ORAL_TABLET | Freq: Four times a day (QID) | ORAL | Status: DC | PRN

## 2013-10-05 NOTE — Patient Instructions (Signed)
Ureteral Colic (Kidney Stones) °Ureteral colic is the result of a condition when kidney stones form inside the kidney. Once kidney stones are formed they may move into the tube that connects the kidney with the bladder (ureter). If this occurs, this condition may cause pain (colic) in the ureter.  °CAUSES  °Pain is caused by stone movement in the ureter and the obstruction caused by the stone. °SYMPTOMS  °The pain comes and goes as the ureter contracts around the stone. The pain is usually intense, sharp, and stabbing in character. The location of the pain may move as the stone moves through the ureter. When the stone is near the kidney the pain is usually located in the back and radiates to the belly (abdomen). When the stone is ready to pass into the bladder the pain is often located in the lower abdomen on the side the stone is located. At this location, the symptoms may mimic those of a urinary tract infection with urinary frequency. Once the stone is located here it often passes into the bladder and the pain disappears completely. °TREATMENT  °· Your caregiver will provide you with medicine for pain relief. °· You may require specialized follow-up X-rays. °· The absence of pain does not always mean that the stone has passed. It may have just stopped moving. If the urine remains completely obstructed, it can cause loss of kidney function or even complete destruction of the involved kidney. It is your responsibility and in your interest that X-rays and follow-ups as suggested by your caregiver are completed. Relief of pain without passage of the stone can be associated with severe damage to the kidney, including loss of kidney function on that side. °· If your stone does not pass on its own, additional measures may be taken by your caregiver to ensure its removal. °HOME CARE INSTRUCTIONS  °· Increase your fluid intake. Water is the preferred fluid since juices containing vitamin C may acidify the urine making it  less likely for certain stones (uric acid stones) to pass. °· Strain all urine. A strainer will be provided. Keep all particulate matter or stones for your caregiver to inspect. °· Take your pain medicine as directed. °· Make a follow-up appointment with your caregiver as directed. °· Remember that the goal is passage of your stone. The absence of pain does not mean the stone is gone. Follow your caregiver's instructions. °· Only take over-the-counter or prescription medicines for pain, discomfort, or fever as directed by your caregiver. °SEEK MEDICAL CARE IF:  °· Pain cannot be controlled with the prescribed medicine. °· You have a fever. °· Pain continues for longer than your caregiver advises it should. °· There is a change in the pain, and you develop chest discomfort or constant abdominal pain. °· You feel faint or pass out. °MAKE SURE YOU:  °· Understand these instructions. °· Will watch your condition. °· Will get help right away if you are not doing well or get worse. °Document Released: 06/06/2005 Document Revised: 12/22/2012 Document Reviewed: 02/21/2011 °[employer]® Patient Information ©2014 [employer]. ° °

## 2013-10-05 NOTE — Telephone Encounter (Signed)
appt made for today 

## 2013-10-05 NOTE — Progress Notes (Signed)
Patient ID: Ann Peterson, female   DOB: [birth year], 48 y.o.   MRN: [id number] SUBJECTIVE: CC: Chief Complaint  Patient presents with  . Urinary Tract Infection    HPI: Left flank pain. Gradual and increasing. No fever. Didn't think she strained her back. Feels a soreness in the left flank, deep inside her side. No rashes no diarrhea no constipation. No nausea no vomiting. No h/o kidney stones. Not on any adkins diet.   Past Medical History  Diagnosis Date  . Hypertension   . Thyroid disease     thyroidectomy due to cancer  . Hyperlipidemia    Past Surgical History  Procedure Laterality Date  . Total thyroidectomy     History   Social History  . Marital Status: Married    Spouse Name: N/A    Number of Children: N/A  . Years of Education: N/A   Occupational History  . Not on file.   Social History Main Topics  . Smoking status: Never Smoker   . Smokeless tobacco: Never Used  . Alcohol Use: No  . Drug Use: No  . Sexual Activity: Not on file   Other Topics Concern  . Not on file   Social History Narrative  . No narrative on file   Family History  Problem Relation Age of Onset  . Hypertension Mother   . Dementia Mother   . Macular degeneration Mother   . Stroke Mother   . COPD Father     lung cancer  . Cancer Brother     colon cancer  . Cancer Brother     brain tumor   Current Outpatient Prescriptions on File Prior to Visit  Medication Sig Dispense Refill  . cloNIDine (CATAPRES) 0.1 MG tablet Take 1 tablet (0.1 mg total) by mouth 2 (two) times daily.  60 tablet  11  . fenofibrate (TRICOR) 145 MG tablet Take 1 tablet (145 mg total) by mouth daily.  30 tablet  11  . fluconazole (DIFLUCAN) 150 MG tablet Take 1 tablet (150 mg total) by mouth once.  1 tablet  1  . levothyroxine (SYNTHROID, LEVOTHROID) 200 MCG tablet Take 1 tablet (200 mcg total) by mouth daily.  30 tablet  11  . lisinopril (PRINIVIL,ZESTRIL) 10 MG tablet Take 1 tablet (10 mg total) by mouth  daily.  30 tablet  11  . amoxicillin (AMOXIL) 875 MG tablet Take 1 tablet (875 mg total) by mouth 2 (two) times daily.  20 tablet  0  . HYDROcodone-homatropine (HYCODAN) 5-1.5 MG/5ML syrup Take 5 mLs by mouth every 8 (eight) hours as needed for cough.  120 mL  0   Current Facility-Administered Medications on File Prior to Visit  Medication Dose Route Frequency Provider Last Rate Last Dose  . methylPREDNISolone acetate (DEPO-MEDROL) injection 80 mg  80 mg Intramuscular Once Lysbeth Penner, FNP       No Known Allergies  There is no immunization history on file for this patient. Prior to Admission medications   Medication Sig Start Date End Date Taking? Authorizing Provider  amoxicillin (AMOXIL) 875 MG tablet Take 1 tablet (875 mg total) by mouth 2 (two) times daily. 09/09/13   Lysbeth Penner, FNP  cloNIDine (CATAPRES) 0.1 MG tablet Take 1 tablet (0.1 mg total) by mouth 2 (two) times daily. 04/17/13   Lysbeth Penner, FNP  fenofibrate (TRICOR) 145 MG tablet Take 1 tablet (145 mg total) by mouth daily. 05/04/13   Lysbeth Penner, FNP  fluconazole (DIFLUCAN) 150  MG tablet Take 1 tablet (150 mg total) by mouth once. 09/16/13   Lysbeth Penner, FNP  HYDROcodone-homatropine [facility]) 5-1.5 MG/5ML syrup Take 5 mLs by mouth every 8 (eight) hours as needed for cough. 09/09/13   Lysbeth Penner, FNP  levothyroxine (SYNTHROID, LEVOTHROID) 200 MCG tablet Take 1 tablet (200 mcg total) by mouth daily. 04/17/13   Lysbeth Penner, FNP  lisinopril (PRINIVIL,ZESTRIL) 10 MG tablet Take 1 tablet (10 mg total) by mouth daily. 04/17/13   Lysbeth Penner, FNP     ROS: As above in the HPI. All other systems are stable or negative.  OBJECTIVE: APPEARANCE:  Patient in no acute distress.The patient appeared well nourished and normally developed. Acyanotic. Waist: VITAL SIGNS:BP 139/81  Pulse 86  Temp(Src) 99.9 F (37.7 C) (Oral)  Ht 5' 3"  (1.6 m)  Wt 197 lb (89.359 kg)  BMI 34.91 kg/m2  LMP 09/18/2013 WF  obese  SKIN: warm and  Dry without overt rashes, tattoos and scars  HEAD and Neck: without JVD, Head and scalp: normal Eyes:No scleral icterus. Fundi normal, eye movements normal. Ears: Auricle normal, canal normal, Tympanic membranes normal, insufflation normal. Nose: normal Throat: normal Neck & thyroid: normal  CHEST & LUNGS: Chest wall: normal Lungs: Clear  CVS: Reveals the PMI to be normally located. Regular rhythm, First and Second Heart sounds are normal,  absence of murmurs, rubs or gallops. Peripheral vasculature: Radial pulses: normal Dorsal pedis pulses: normal Posterior pulses: normal  ABDOMEN:  Appearance: normal obese centrally , no organomegaly, no masses, no Abdominal Aortic enlargement. No Guarding , no rebound. No Bruits. Mild left CVA tenderness. No masses. Bowel sounds: normal  RECTAL: N/A GU: N/A  EXTREMETIES: nonedematous.  MUSCULOSKELETAL:  Spine: normal Joints: intact  NEUROLOGIC: oriented to time,place and person; nonfocal.   ASSESSMENT: Frequency - Plan: POCT UA - Microscopic Only, POCT urinalysis dipstick, DG Abd 1 View, Urine culture, sulfamethoxazole-trimethoprim (BACTRIM DS) 800-160 MG per tablet  Hematuria - Plan: DG Abd 1 View, Urine culture, HYDROcodone-acetaminophen (NORCO/VICODIN) 5-325 MG per tablet, sulfamethoxazole-trimethoprim (BACTRIM DS) 800-160 MG per tablet  Renal colic on left side - Plan: DG Abd 1 View, BMP8+EGFR, Urine culture, POCT CBC, HYDROcodone-acetaminophen (NORCO/VICODIN) 5-325 MG per tablet, sulfamethoxazole-trimethoprim (BACTRIM DS) 800-160 MG per tablet Microscopic hematuria.   Need to r/o or treat as renal colic.  PLAN: Hand out in th eAVS on renal colic.  Orders Placed This Encounter  Procedures  . Urine culture  . DG Abd 1 View    Standing Status: Future     Number of Occurrences: 1     Standing Expiration Date: 12/05/2014    Order Specific Question:  Reason for Exam (SYMPTOM  OR DIAGNOSIS  REQUIRED)    Answer:  renal colic    Order Specific Question:  Is the patient pregnant?    Answer:  No    Order Specific Question:  Preferred imaging location?    Answer:  Internal  . BMP8+EGFR  . POCT UA - Microscopic Only  . POCT urinalysis dipstick  . POCT CBC   Results for orders placed in visit on 10/05/13  POCT UA - MICROSCOPIC ONLY      Result Value Range   WBC, Ur, HPF, POC 10-15     RBC, urine, microscopic 1-5     Bacteria, U Microscopic neg     Mucus, UA neg     Epithelial cells, urine per micros occ     Crystals, Ur, HPF, POC neg  Casts, Ur, LPF, POC neg     Yeast, UA neg    POCT URINALYSIS DIPSTICK      Result Value Range   Color, UA yellow     Clarity, UA clear     Glucose, UA neg     Bilirubin, UA neg     Ketones, UA neg     Spec Grav, UA >=1.030     Blood, UA trace     pH, UA 5.0     Protein, UA neg     Urobilinogen, UA negative     Nitrite, UA neg     Leukocytes, UA moderate (2+)    POCT CBC      Result Value Range   WBC 9.2  4.6 - 10.2 K/uL   Lymph, poc 1.9  0.6 - 3.4   POC LYMPH PERCENT 20.6  10 - 50 %L   POC Granulocyte 6.9  2 - 6.9   Granulocyte percent 75.1  37 - 80 %G   RBC 4.6  4.04 - 5.48 M/uL   Hemoglobin 12.2  12.2 - 16.2 g/dL   HCT, POC 38.3  37.7 - 47.9 %   MCV 83.6  80 - 97 fL   MCH, POC 26.6 (*) 27 - 31.2 pg   MCHC 31.8  31.8 - 35.4 g/dL   RDW, POC 16.4     Platelet Count, POC 317.0  142 - 424 K/uL   MPV 7.7  0 - 99.8 fL   Meds ordered this encounter  Medications  . HYDROcodone-acetaminophen (NORCO/VICODIN) 5-325 MG per tablet    Sig: Take 1 tablet by mouth every 6 (six) hours as needed for moderate pain.    Dispense:  30 tablet    Refill:  0  . sulfamethoxazole-trimethoprim (BACTRIM DS) 800-160 MG per tablet    Sig: Take 1 tablet by mouth 2 (two) times daily.    Dispense:  20 tablet    Refill:  0    [facility] reading (PRIMARY) by  Dr. Jacelyn Grip  : scoliosis, s/p cholecyctectomy, calcification in the pelvis and left upper  quadrant. ? Phlebolith vs stone.                             Gave patient a strainer to capture a stone. Fluids 2L/24hours.   There are no discontinued medications. Return in about 1 week (around 10/12/2013) for Recheck medical problems.  Momo Braun P. Jacelyn Grip, M.D.

## 2013-10-06 LAB — BMP8+EGFR
BUN/Creatinine Ratio: 16 (ref 9–23)
BUN: 14 mg/dL (ref 6–24)
CO2: 23 mmol/L (ref 18–29)
Calcium: 9.6 mg/dL (ref 8.7–10.2)
Chloride: 100 mmol/L (ref 97–108)
Creatinine, Ser: 0.85 mg/dL (ref 0.57–1.00)
GFR calc Af Amer: 95 mL/min/{1.73_m2} (ref >59)
GFR calc non Af Amer: 82 mL/min/{1.73_m2} (ref >59)
Glucose: 95 mg/dL (ref 65–99)
Potassium: 4.4 mmol/L (ref 3.5–5.2)
Sodium: 142 mmol/L (ref 134–144)

## 2013-10-07 LAB — URINE CULTURE

## 2013-10-11 ENCOUNTER — Other Ambulatory Visit: Payer: Self-pay | Admitting: Family Medicine

## 2013-10-11 DIAGNOSIS — N390 Urinary tract infection, site not specified: Secondary | ICD-10-CM

## 2013-10-11 MED ORDER — AMOXICILLIN 500 MG PO CAPS: 500.0000 mg | ORAL_CAPSULE | Freq: Three times a day (TID) | ORAL | Status: DC

## 2013-10-11 NOTE — Progress Notes (Signed)
Quick Note:  Call Patient Labs that are abnormal: Urine culture grew strep. She is on bactrim.  Recommendations: Switch to amoxil 500 mg tid. For 10 days. Ordered in [facility].   ______

## 2013-10-12 ENCOUNTER — Telehealth: Payer: Self-pay

## 2013-10-12 ENCOUNTER — Other Ambulatory Visit: Payer: Self-pay | Admitting: Family Medicine

## 2013-10-12 DIAGNOSIS — N760 Acute vaginitis: Secondary | ICD-10-CM

## 2013-10-12 MED ORDER — FLUCONAZOLE 150 MG PO TABS: 150.0000 mg | ORAL_TABLET | Freq: Once | ORAL | Status: DC

## 2013-10-12 NOTE — Telephone Encounter (Signed)
Call patient : Prescription refilled & sent to pharmacy in [place]. 

## 2013-10-12 NOTE — Telephone Encounter (Signed)
Pt calls requesting "diflucan" 2 doses sent to [place].

## 2013-10-12 NOTE — Telephone Encounter (Signed)
PT NOTIFIED RX SENT TO [facility]

## 2013-10-31 ENCOUNTER — Ambulatory Visit (INDEPENDENT_AMBULATORY_CARE_PROVIDER_SITE_OTHER): Payer: BC Managed Care – PPO | Admitting: General Practice

## 2013-10-31 VITALS — BP 141/79 | HR 77 | Temp 98.4°F

## 2013-10-31 DIAGNOSIS — J0100 Acute maxillary sinusitis, unspecified: Secondary | ICD-10-CM

## 2013-10-31 DIAGNOSIS — N760 Acute vaginitis: Secondary | ICD-10-CM

## 2013-10-31 DIAGNOSIS — J069 Acute upper respiratory infection, unspecified: Secondary | ICD-10-CM

## 2013-10-31 DIAGNOSIS — R059 Cough, unspecified: Secondary | ICD-10-CM

## 2013-10-31 DIAGNOSIS — R05 Cough: Secondary | ICD-10-CM

## 2013-10-31 MED ORDER — BENZONATATE 100 MG PO CAPS: 100.0000 mg | ORAL_CAPSULE | Freq: Three times a day (TID) | ORAL | Status: DC | PRN

## 2013-10-31 MED ORDER — AZITHROMYCIN 250 MG PO TABS: ORAL_TABLET | ORAL | Status: DC

## 2013-10-31 MED ORDER — FLUCONAZOLE 150 MG PO TABS: 150.0000 mg | ORAL_TABLET | Freq: Once | ORAL | Status: DC

## 2013-10-31 NOTE — Patient Instructions (Signed)

## 2013-10-31 NOTE — Progress Notes (Signed)
   Subjective:    Patient ID: Ann Peterson, female    DOB: [birth year], 48 y.o.   MRN: [id number]  Cough This is a new problem. The current episode started in the past 7 days. The problem has been gradually worsening. The cough is productive of sputum. Associated symptoms include nasal congestion and postnasal drip. Pertinent negatives include no chest pain, chills, fever, sore throat or shortness of breath. The symptoms are aggravated by lying down. She has tried OTC cough suppressant for the symptoms. Her past medical history is significant for bronchitis. There is no history of asthma or pneumonia.      Review of Systems  Constitutional: Negative for fever and chills.  HENT: Positive for congestion, postnasal drip and sinus pressure. Negative for sore throat.   Respiratory: Positive for cough. Negative for chest tightness and shortness of breath.   Cardiovascular: Negative for chest pain and palpitations.       Objective:   Physical Exam  Constitutional: She is oriented to person, place, and time. She appears well-developed and well-nourished.  HENT:  Head: Normocephalic.  Right Ear: External ear normal.  Left Ear: External ear normal.  Nose: Right sinus exhibits maxillary sinus tenderness. Left sinus exhibits maxillary sinus tenderness.  Mouth/Throat: Oropharynx is clear and moist.  Cardiovascular: Normal rate, regular rhythm and normal heart sounds.   Pulmonary/Chest: Effort normal and breath sounds normal. No respiratory distress. She exhibits no tenderness.  Neurological: She is alert and oriented to person, place, and time.  Skin: Skin is warm and dry.  Psychiatric: She has a normal mood and affect.          Assessment & Plan:  1. Sinusitis, acute maxillary, 2. Upper respiratory infection  - azithromycin (ZITHROMAX) 250 MG tablet; Take as directed  Dispense: 6 tablet; Refill: 0  3. Cough  - benzonatate (TESSALON) 100 MG capsule; Take 1 capsule (100 mg total) by mouth  3 (three) times daily as needed.  Dispense: 30 capsule; Refill: 0  4. Vaginitis and vulvovaginitis  - fluconazole (DIFLUCAN) 150 MG tablet; Take 1 tablet (150 mg total) by mouth once. May repeat dose in 3 days  Dispense: 2 tablet; Refill: 1 -RTO if symptoms worsen or unresolved Patient verbalized understanding Erby Pian, FNP-C

## 2013-11-01 ENCOUNTER — Encounter: Payer: Self-pay | Admitting: General Practice

## 2014-02-20 ENCOUNTER — Ambulatory Visit (INDEPENDENT_AMBULATORY_CARE_PROVIDER_SITE_OTHER): Payer: BC Managed Care – PPO | Admitting: General Practice

## 2014-02-20 ENCOUNTER — Encounter: Payer: Self-pay | Admitting: General Practice

## 2014-02-20 VITALS — BP 156/97 | HR 76 | Temp 97.1°F | Ht 63.0 in | Wt 195.0 lb

## 2014-02-20 DIAGNOSIS — J310 Chronic rhinitis: Secondary | ICD-10-CM

## 2014-02-20 DIAGNOSIS — J329 Chronic sinusitis, unspecified: Secondary | ICD-10-CM

## 2014-02-20 DIAGNOSIS — J309 Allergic rhinitis, unspecified: Secondary | ICD-10-CM

## 2014-02-20 DIAGNOSIS — N390 Urinary tract infection, site not specified: Secondary | ICD-10-CM

## 2014-02-20 DIAGNOSIS — R300 Dysuria: Secondary | ICD-10-CM

## 2014-02-20 LAB — POCT URINALYSIS DIPSTICK
Bilirubin, UA: NEGATIVE
Glucose: NEGATIVE
Ketones, UA: NEGATIVE
LEUKOCYTES UA: NEGATIVE
NITRITE UA: NEGATIVE
PH UA: 6
PROTEIN UA: 30
Spec Grav, UA: 1.015
UROBILINOGEN UA: 0.2

## 2014-02-20 MED ORDER — FLUTICASONE PROPIONATE 50 MCG/ACT NA SUSP: 2.0000 | Freq: Every day | NASAL | Status: DC

## 2014-02-20 MED ORDER — CIPROFLOXACIN HCL 500 MG PO TABS: 500.0000 mg | ORAL_TABLET | Freq: Two times a day (BID) | ORAL | Status: DC

## 2014-02-20 MED ORDER — METHYLPREDNISOLONE ACETATE 80 MG/ML IJ SUSP
80.0000 mg | Freq: Once | INTRAMUSCULAR | Status: AC
  Administered 2014-02-20: 80 mg via INTRAMUSCULAR

## 2014-02-20 MED ORDER — PHENAZOPYRIDINE HCL 200 MG PO TABS: 200.0000 mg | ORAL_TABLET | Freq: Three times a day (TID) | ORAL | Status: DC | PRN

## 2014-02-20 MED ORDER — METHYLPREDNISOLONE ACETATE 80 MG/ML IJ SUSP: 80.0000 mg | Freq: Once | INTRAMUSCULAR | Status: DC

## 2014-02-20 MED ORDER — FLUCONAZOLE 150 MG PO TABS: 150.0000 mg | ORAL_TABLET | Freq: Once | ORAL | Status: DC

## 2014-02-20 NOTE — Patient Instructions (Addendum)
Urinary Tract Infection Urinary tract infections (UTIs) can develop anywhere along your urinary tract. Your urinary tract is your body's drainage system for removing wastes and extra water. Your urinary tract includes two kidneys, two ureters, a bladder, and a urethra. Your kidneys are a pair of bean-shaped organs. Each kidney is about the size of your fist. They are located below your ribs, one on each side of your spine. CAUSES Infections are caused by microbes, which are microscopic organisms, including fungi, viruses, and bacteria. These organisms are so small that they can only be seen through a microscope. Bacteria are the microbes that most commonly cause UTIs. SYMPTOMS  Symptoms of UTIs may vary by age and gender of the patient and by the location of the infection. Symptoms in young women typically include a frequent and intense urge to urinate and a painful, burning feeling in the bladder or urethra during urination. Older women and men are more likely to be tired, shaky, and weak and have muscle aches and abdominal pain. A fever may mean the infection is in your kidneys. Other symptoms of a kidney infection include pain in your back or sides below the ribs, nausea, and vomiting. DIAGNOSIS To diagnose a UTI, your caregiver will ask you about your symptoms. Your caregiver also will ask to provide a urine sample. The urine sample will be tested for bacteria and white blood cells. White blood cells are made by your body to help fight infection. TREATMENT  Typically, UTIs can be treated with medication. Because most UTIs are caused by a bacterial infection, they usually can be treated with the use of antibiotics. The choice of antibiotic and length of treatment depend on your symptoms and the type of bacteria causing your infection. HOME CARE INSTRUCTIONS  If you were prescribed antibiotics, take them exactly as your caregiver instructs you. Finish the medication even if you feel better after you  have only taken some of the medication.  Drink enough water and fluids to keep your urine clear or pale yellow.  Avoid caffeine, tea, and carbonated beverages. They tend to irritate your bladder.  Empty your bladder often. Avoid holding urine for long periods of time.  Empty your bladder before and after sexual intercourse.  After a bowel movement, women should cleanse from front to back. Use each tissue only once. SEEK MEDICAL CARE IF:   You have back pain.  You develop a fever.  Your symptoms do not begin to resolve within 3 days. SEEK IMMEDIATE MEDICAL CARE IF:   You have severe back pain or lower abdominal pain.  You develop chills.  You have nausea or vomiting.  You have continued burning or discomfort with urination. MAKE SURE YOU:   Understand these instructions.  Will watch your condition.  Will get help right away if you are not doing well or get worse. Document Released: 06/06/2005 Document Revised: 02/26/2012 Document Reviewed: 10/05/2011 [facility] Patient Information 2014 [place].  Allergic Rhinitis Allergic rhinitis is when the mucous membranes in the nose respond to allergens. Allergens are particles in the air that cause your body to have an allergic reaction. This causes you to release allergic antibodies. Through a chain of events, these eventually cause you to release histamine into the blood stream. Although meant to protect the body, it is this release of histamine that causes your discomfort, such as frequent sneezing, congestion, and an itchy, runny nose.  CAUSES  Seasonal allergic rhinitis (hay fever) is caused by pollen allergens that may come  from grasses, trees, and weeds. Year-round allergic rhinitis (perennial allergic rhinitis) is caused by allergens such as house dust mites, pet dander, and mold spores.  SYMPTOMS   Nasal stuffiness (congestion).  Itchy, runny nose with sneezing and tearing of the eyes. DIAGNOSIS  Your health care  provider can help you determine the allergen or allergens that trigger your symptoms. If you and your health care provider are unable to determine the allergen, skin or blood testing may be used. TREATMENT  Allergic Rhinitis does not have a cure, but it can be controlled by:  Medicines and allergy shots (immunotherapy).  Avoiding the allergen. Hay fever may often be treated with antihistamines in pill or nasal spray forms. Antihistamines block the effects of histamine. There are over-the-counter medicines that may help with nasal congestion and swelling around the eyes. Check with your health care provider before taking or giving this medicine.  If avoiding the allergen or the medicine prescribed do not work, there are many new medicines your health care provider can prescribe. Stronger medicine may be used if initial measures are ineffective. Desensitizing injections can be used if medicine and avoidance does not work. Desensitization is when a patient is given ongoing shots until the body becomes less sensitive to the allergen. Make sure you follow up with your health care provider if problems continue. HOME CARE INSTRUCTIONS It is not possible to completely avoid allergens, but you can reduce your symptoms by taking steps to limit your exposure to them. It helps to know exactly what you are allergic to so that you can avoid your specific triggers. SEEK MEDICAL CARE IF:   You have a fever.  You develop a cough that does not stop easily (persistent).  You have shortness of breath.  You start wheezing.  Symptoms interfere with normal daily activities. Document Released: 05/22/2001 Document Revised: 06/17/2013 Document Reviewed: 05/04/2013 [facility] Patient Information 2014 [place].

## 2014-02-20 NOTE — Addendum Note (Signed)
Addended byAleen Sells E on: 02/20/2014 11:58 AM   Modules accepted: Orders

## 2014-02-20 NOTE — Progress Notes (Signed)
Subjective:    Patient ID: Ann Peterson, female    DOB: April 24, 1966, 48 y.o.   MRN: [id number]  Abdominal Pain This is a new problem. The current episode started yesterday. The onset quality is sudden. The problem occurs constantly. The pain is located in the suprapubic region. The pain is at a severity of 4/10. The pain is mild. The quality of the pain is aching. The abdominal pain does not radiate. Associated symptoms include dysuria and frequency. Pertinent negatives include no constipation, diarrhea, fever, flatus, headaches, nausea or vomiting. Nothing aggravates the pain. The pain is relieved by nothing. She has tried nothing for the symptoms.  Sinus Problem This is a new problem. The current episode started 1 to 4 weeks ago. The problem is unchanged. There has been no fever. Associated symptoms include coughing, sinus pressure and sneezing. Pertinent negatives include no chills, ear pain, headaches, shortness of breath or sore throat. Past treatments include oral decongestants and lying down. The treatment provided no relief.      Review of Systems  Constitutional: Negative for fever and chills.  HENT: Positive for sinus pressure and sneezing. Negative for ear pain, postnasal drip, rhinorrhea and sore throat.   Respiratory: Positive for cough. Negative for shortness of breath.   Cardiovascular: Negative for chest pain and palpitations.  Gastrointestinal: Positive for abdominal pain. Negative for nausea, vomiting, diarrhea, constipation and flatus.  Genitourinary: Positive for dysuria, urgency and frequency. Negative for difficulty urinating.  Neurological: Negative for dizziness, weakness and headaches.       Objective:   Physical Exam  Constitutional: She is oriented to person, place, and time. She appears well-developed and well-nourished.  HENT:  Head: Normocephalic and atraumatic.  Right Ear: External ear normal.  Left Ear: External ear normal.  Nose: Mucosal edema present.  Right sinus exhibits maxillary sinus tenderness. Left sinus exhibits maxillary sinus tenderness.  Mouth/Throat: Posterior oropharyngeal erythema present.  Cardiovascular: Normal rate, regular rhythm and normal heart sounds.   Pulmonary/Chest: Effort normal and breath sounds normal. No respiratory distress. She exhibits no tenderness.  Abdominal: Soft. Bowel sounds are normal. She exhibits no distension. There is tenderness in the epigastric area. There is no CVA tenderness.  Neurological: She is alert and oriented to person, place, and time.  Skin: Skin is warm and dry. No rash noted.  Psychiatric: She has a normal mood and affect.      Results for orders placed in visit on 02/20/14  POCT URINALYSIS DIPSTICK      Result Value Ref Range   Color, UA yellow     Clarity, UA clear     Glucose negative     Bilirubin, UA negative     Ketones, UA negative     Spec Grav, UA 1.015     Blood, UA large     pH, UA 6.0     Protein, UA 30     Urobilinogen, UA 0.2     Nitrite, UA negative     Leukocytes, UA Negative         Assessment & Plan:  1. UTI (urinary tract infection) - ciprofloxacin (CIPRO) 500 MG tablet; Take 1 tablet (500 mg total) by mouth 2 (two) times daily.  Dispense: 20 tablet; Refill: 0 2. Dysuria  - POCT urinalysis dipstick 3. Rhinosinusitis - methylPREDNISolone acetate (DEPO-MEDROL) injection 80 mg; Inject 1 mL (80 mg total) into the muscle once.  4. Allergic rhinitis - fluticasone (FLONASE) 50 MCG/ACT nasal spray; Place 2 sprays into both nostrils  daily.  Dispense: 16 g; Refill: 6 -provided and discussed patient educational material -RTO if symptoms worsen or unresolved -may seek emergency medical treatment Patient verbalized understanding Erby Pian, FNP-C

## 2014-03-29 ENCOUNTER — Telehealth: Payer: Self-pay | Admitting: General Practice

## 2014-04-01 NOTE — Telephone Encounter (Signed)
Detailed message left that if still needs to be seen to please call back

## 2014-04-15 ENCOUNTER — Other Ambulatory Visit: Payer: Self-pay | Admitting: Family Medicine

## 2014-04-16 NOTE — Telephone Encounter (Signed)
Last seen 02/20/14  Ann Peterson  Last thyroid 04/17/13

## 2014-04-17 NOTE — Telephone Encounter (Signed)
Patient NTBS for follow up and lab work  

## 2014-05-02 ENCOUNTER — Other Ambulatory Visit: Payer: Self-pay | Admitting: Family Medicine

## 2014-05-03 ENCOUNTER — Other Ambulatory Visit: Payer: Self-pay | Admitting: Nurse Practitioner

## 2014-05-17 ENCOUNTER — Other Ambulatory Visit: Payer: Self-pay | Admitting: Nurse Practitioner

## 2014-06-18 ENCOUNTER — Other Ambulatory Visit: Payer: Self-pay | Admitting: Family Medicine

## 2014-06-21 NOTE — Telephone Encounter (Signed)
Last seen 02/20/14 Ann Peterson  Last thyroids 04/17/13

## 2014-06-21 NOTE — Telephone Encounter (Signed)
This is okay to refill x1. Patient should come by and get a thyroid profile for [place] to be able to continue to refill this medication in the future

## 2014-06-29 ENCOUNTER — Telehealth: Payer: Self-pay | Admitting: Family Medicine

## 2014-06-29 NOTE — Telephone Encounter (Signed)
Pt does have refills at [facility]. I spoke with pharmacist. Pt aware.

## 2014-07-21 ENCOUNTER — Telehealth: Payer: Self-pay | Admitting: Family Medicine

## 2014-07-21 MED ORDER — LEVOTHYROXINE SODIUM 200 MCG PO TABS: ORAL_TABLET | ORAL | Status: DC

## 2014-07-21 NOTE — Telephone Encounter (Signed)
done

## 2014-07-28 ENCOUNTER — Encounter: Payer: Self-pay | Admitting: Family Medicine

## 2014-07-28 ENCOUNTER — Ambulatory Visit (INDEPENDENT_AMBULATORY_CARE_PROVIDER_SITE_OTHER): Payer: BC Managed Care – PPO | Admitting: Family Medicine

## 2014-07-28 VITALS — BP 168/85 | HR 75 | Temp 98.6°F | Ht 63.0 in | Wt 192.0 lb

## 2014-07-28 DIAGNOSIS — I10 Essential (primary) hypertension: Secondary | ICD-10-CM

## 2014-07-28 DIAGNOSIS — R5383 Other fatigue: Secondary | ICD-10-CM

## 2014-07-28 DIAGNOSIS — E038 Other specified hypothyroidism: Secondary | ICD-10-CM

## 2014-07-28 NOTE — Progress Notes (Signed)
   Subjective:    Patient ID: Ann Peterson, female    DOB: 09-24-1965, 48 y.o.   MRN: [id number]  HPI Patient c/o fatigue. She has been having skin changes.  She has fatigue.  Review of Systems  Constitutional: Negative for fever.  HENT: Negative for ear pain.   Eyes: Negative for discharge.  Respiratory: Negative for cough.   Cardiovascular: Negative for chest pain.  Gastrointestinal: Negative for abdominal distention.  Endocrine: Negative for polyuria.  Genitourinary: Negative for difficulty urinating.  Musculoskeletal: Negative for gait problem and neck pain.  Skin: Negative for color change and rash.  Neurological: Negative for speech difficulty and headaches.  Psychiatric/Behavioral: Negative for agitation.       Objective:    BP 168/85 mmHg  Pulse 75  Temp(Src) 98.6 F (37 C) (Oral)  Ht $R'5\' 3"'Fv$  (1.6 m)  Wt 192 lb (87.091 kg)  BMI 34.02 kg/m2  LMP 07/25/2014 Physical Exam  Constitutional: She is oriented to person, place, and time. She appears well-developed and well-nourished.  HENT:  Head: Normocephalic and atraumatic.  Mouth/Throat: Oropharynx is clear and moist.  Eyes: Pupils are equal, round, and reactive to light.  Neck: Normal range of motion. Neck supple.  Cardiovascular: Normal rate and regular rhythm.   No murmur heard. Pulmonary/Chest: Effort normal and breath sounds normal.  Abdominal: Soft. Bowel sounds are normal. There is no tenderness.  Neurological: She is alert and oriented to person, place, and time.  Skin: Skin is warm and dry.  Psychiatric: She has a normal mood and affect.          Assessment & Plan:     ICD-9-CM ICD-10-CM   1. Essential hypertension, benign 401.1 I10 CMP14+EGFR     Lipid panel  2. Other specified hypothyroidism 244.8 E03.8 CMP14+EGFR     Thyroid Panel With TSH  3. Other fatigue 780.79 R53.83 POCT CBC     Thyroid Panel With TSH     Vit D  25 hydroxy (rtn osteoporosis monitoring)     Vitamin B12     Return if  symptoms worsen or fail to improve.  Lysbeth Penner FNP

## 2014-07-28 NOTE — Addendum Note (Signed)
Addended by: Pollyann Kennedy F on: 07/28/2014 04:32 PM   Modules accepted: Orders

## 2014-07-28 NOTE — Addendum Note (Signed)
Addended by: Lysbeth Penner on: 07/28/2014 05:44 PM   Modules accepted: Miquel Dunn

## 2014-07-29 LAB — CMP14+EGFR
ALT: 18 IU/L (ref 0–32)
AST: 18 IU/L (ref 0–40)
Albumin/Globulin Ratio: 2 (ref 1.1–2.5)
Albumin: 4.4 g/dL (ref 3.5–5.5)
Alkaline Phosphatase: 68 IU/L (ref 39–117)
BUN/Creatinine Ratio: 19 (ref 9–23)
BUN: 15 mg/dL (ref 6–24)
CO2: 25 mmol/L (ref 18–29)
Calcium: 8.9 mg/dL (ref 8.7–10.2)
Chloride: 100 mmol/L (ref 97–108)
Creatinine, Ser: 0.8 mg/dL (ref 0.57–1.00)
GFR calc Af Amer: 102 mL/min/{1.73_m2} (ref >59)
GFR calc non Af Amer: 88 mL/min/{1.73_m2} (ref >59)
Globulin, Total: 2.2 g/dL (ref 1.5–4.5)
Glucose: 87 mg/dL (ref 65–99)
Potassium: 4.3 mmol/L (ref 3.5–5.2)
Sodium: 139 mmol/L (ref 134–144)
Total Bilirubin: 0.2 mg/dL (ref 0.0–1.2)
Total Protein: 6.6 g/dL (ref 6.0–8.5)

## 2014-07-29 LAB — CBC WITH DIFFERENTIAL
Basophils Absolute: 0.1 10*3/uL (ref 0.0–0.2)
Basos: 1 %
Eos: 2 %
Eosinophils Absolute: 0.2 10*3/uL (ref 0.0–0.4)
HCT: 38.3 % (ref 34.0–46.6)
Hemoglobin: 12.4 g/dL (ref 11.1–15.9)
Immature Grans (Abs): 0 10*3/uL (ref 0.0–0.1)
Immature Granulocytes: 0 %
Lymphocytes Absolute: 2.1 10*3/uL (ref 0.7–3.1)
Lymphs: 26 %
MCH: 28.4 pg (ref 26.6–33.0)
MCHC: 32.4 g/dL (ref 31.5–35.7)
MCV: 88 fL (ref 79–97)
Monocytes Absolute: 0.6 10*3/uL (ref 0.1–0.9)
Monocytes: 7 %
Neutrophils Absolute: 5.3 10*3/uL (ref 1.4–7.0)
Neutrophils Relative %: 64 %
Platelets: 312 10*3/uL (ref 150–379)
RBC: 4.37 x10E6/uL (ref 3.77–5.28)
RDW: 13.9 % (ref 12.3–15.4)
WBC: 8.3 10*3/uL (ref 3.4–10.8)

## 2014-07-29 LAB — LIPID PANEL
Chol/HDL Ratio: 5.3 ratio units — ABNORMAL HIGH (ref 0.0–4.4)
Cholesterol, Total: 208 mg/dL — ABNORMAL HIGH (ref 100–199)
HDL: 39 mg/dL — ABNORMAL LOW (ref >39)
LDL Calculated: 124 mg/dL — ABNORMAL HIGH (ref 0–99)
Triglycerides: 223 mg/dL — ABNORMAL HIGH (ref 0–149)
VLDL Cholesterol Cal: 45 mg/dL — ABNORMAL HIGH (ref 5–40)

## 2014-07-29 LAB — VITAMIN B12: Vitamin B-12: 644 pg/mL (ref 211–946)

## 2014-07-29 LAB — VITAMIN D 25 HYDROXY (VIT D DEFICIENCY, FRACTURES): Vit D, 25-Hydroxy: 17.2 ng/mL — ABNORMAL LOW (ref 30.0–100.0)

## 2014-07-29 LAB — THYROID PANEL WITH TSH
Free Thyroxine Index: 3.4 (ref 1.2–4.9)
T3 Uptake Ratio: 29 % (ref 24–39)
T4, Total: 11.6 ug/dL (ref 4.5–12.0)
TSH: 0.602 u[IU]/mL (ref 0.450–4.500)

## 2014-07-30 ENCOUNTER — Other Ambulatory Visit: Payer: Self-pay | Admitting: Family Medicine

## 2014-07-30 ENCOUNTER — Telehealth: Payer: Self-pay

## 2014-07-30 MED ORDER — VITAMIN D (ERGOCALCIFEROL) 1.25 MG (50000 UNIT) PO CAPS: 50000.0000 [IU] | ORAL_CAPSULE | ORAL | Status: DC

## 2014-07-30 NOTE — Telephone Encounter (Signed)
-----   Message from Lysbeth Penner, FNP sent at 07/30/2014  9:20 AM EST ----- Ann Peterson are doing a lot better.  Vitamin D is low and will send rx in for vit D and take 2000 iu po qd otc

## 2014-07-30 NOTE — Telephone Encounter (Signed)
Lm with lab results and need for rx p/u on am as per DPR of 04/2013

## 2014-08-17 ENCOUNTER — Other Ambulatory Visit: Payer: Self-pay | Admitting: Family Medicine

## 2014-08-30 ENCOUNTER — Other Ambulatory Visit: Payer: Self-pay | Admitting: Family Medicine

## 2014-12-18 ENCOUNTER — Other Ambulatory Visit: Payer: Self-pay | Admitting: Family Medicine

## 2015-01-25 ENCOUNTER — Other Ambulatory Visit: Payer: Self-pay | Admitting: *Deleted

## 2015-01-25 MED ORDER — LISINOPRIL 10 MG PO TABS: ORAL_TABLET | ORAL | Status: DC

## 2015-01-25 MED ORDER — CLONIDINE HCL 0.1 MG PO TABS: ORAL_TABLET | ORAL | Status: DC

## 2015-02-15 ENCOUNTER — Other Ambulatory Visit: Payer: Self-pay

## 2015-02-15 MED ORDER — LEVOTHYROXINE SODIUM 200 MCG PO TABS: ORAL_TABLET | ORAL | Status: DC

## 2015-05-01 ENCOUNTER — Other Ambulatory Visit: Payer: Self-pay | Admitting: Family Medicine

## 2015-05-02 NOTE — Telephone Encounter (Signed)
Last seen 07/25/14 B [place]

## 2015-05-03 NOTE — Telephone Encounter (Signed)
Only 30 day supply sent in- Patient NTBS for follow up and lab work

## 2015-05-03 NOTE — Telephone Encounter (Signed)
Last seen 07/28/14 B [place]

## 2015-05-03 NOTE — Telephone Encounter (Signed)
Detailed message left for patient.

## 2015-05-06 ENCOUNTER — Other Ambulatory Visit: Payer: Self-pay | Admitting: Family Medicine

## 2015-05-09 NOTE — Telephone Encounter (Signed)
no more refills without being seen  

## 2015-05-09 NOTE — Telephone Encounter (Signed)
Last seen 07/28/14  B [place]

## 2015-07-12 ENCOUNTER — Encounter: Payer: Self-pay | Admitting: Family Medicine

## 2015-07-12 ENCOUNTER — Ambulatory Visit (INDEPENDENT_AMBULATORY_CARE_PROVIDER_SITE_OTHER): Payer: BLUE CROSS/BLUE SHIELD | Admitting: Family Medicine

## 2015-07-12 VITALS — BP 148/80 | HR 84 | Temp 100.1°F | Ht 63.0 in | Wt 167.4 lb

## 2015-07-12 DIAGNOSIS — J329 Chronic sinusitis, unspecified: Secondary | ICD-10-CM

## 2015-07-12 DIAGNOSIS — J302 Other seasonal allergic rhinitis: Secondary | ICD-10-CM | POA: Diagnosis not present

## 2015-07-12 DIAGNOSIS — J40 Bronchitis, not specified as acute or chronic: Secondary | ICD-10-CM | POA: Diagnosis not present

## 2015-07-12 MED ORDER — BETAMETHASONE SOD PHOS & ACET 6 (3-3) MG/ML IJ SUSP
6.0000 mg | Freq: Once | INTRAMUSCULAR | Status: AC
  Administered 2015-07-12: 6 mg via INTRAMUSCULAR

## 2015-07-12 MED ORDER — HYDROCODONE-HOMATROPINE 5-1.5 MG/5ML PO SYRP: 5.0000 mL | ORAL_SOLUTION | Freq: Four times a day (QID) | ORAL | Status: DC | PRN

## 2015-07-12 MED ORDER — FLUTICASONE PROPIONATE 50 MCG/ACT NA SUSP: 2.0000 | Freq: Every day | NASAL | Status: DC

## 2015-07-12 MED ORDER — LEVOFLOXACIN 500 MG PO TABS: 500.0000 mg | ORAL_TABLET | Freq: Every day | ORAL | Status: DC

## 2015-07-12 NOTE — Progress Notes (Signed)
Subjective:  Patient ID: Ann Peterson, female    DOB: 01/12/1966  Age: 49 y.o. MRN: [id number]  CC: URI   HPI Tinsley Everman presents for Cough for 5 days gradually increasing. Now having intractable paroxysms but nonproductive.. DOE WITH PUSHING GURNEY AT WORK. She had a pneumonia earlier this year that was rather severe it scared her and caused her to miss a lot of work due to severity of illness. She wants to be absolutely sure that does not happen again.  History Lilliann has a past medical history of Hypertension; Thyroid disease; and Hyperlipidemia.   She has past surgical history that includes Total thyroidectomy.   Her family history includes COPD in her father; Cancer in her brother and brother; Dementia in her mother; Hypertension in her mother; Macular degeneration in her mother; Stroke in her mother.She reports that she has never smoked. She has never used smokeless tobacco. She reports that she does not drink alcohol or use illicit drugs.  Outpatient Prescriptions Prior to Visit  Medication Sig Dispense Refill  . cloNIDine (CATAPRES) 0.1 MG tablet TAKE 1 TABLET (0.1 MG TOTAL) BY MOUTH 2 (TWO) TIMES DAILY. MUST BE SEEN 180 tablet 0  . levothyroxine (SYNTHROID, LEVOTHROID) 200 MCG tablet TAKE 1 TABLET (200 MCG TOTAL) BY MOUTH DAILY. 90 tablet 1  . lisinopril (PRINIVIL,ZESTRIL) 10 MG tablet TAKE 1 TABLET (10 MG TOTAL) BY MOUTH DAILY. 30 tablet 0  . fluticasone (FLONASE) 50 MCG/ACT nasal spray Place 2 sprays into both nostrils daily. 16 g 6  . phenazopyridine (PYRIDIUM) 200 MG tablet Take 1 tablet (200 mg total) by mouth 3 (three) times daily as needed for pain. (Patient not taking: Reported on 07/12/2015) 6 tablet 0  . Vitamin D, Ergocalciferol, (DRISDOL) 50000 UNITS CAPS capsule Take 1 capsule (50,000 Units total) by mouth every 7 (seven) days. (Patient not taking: Reported on 07/12/2015) 18 capsule 0   No facility-administered medications prior to visit.    ROS Review of Systems    Constitutional: Negative for fever, chills, activity change and appetite change.  HENT: Positive for congestion and postnasal drip. Negative for ear discharge, ear pain, hearing loss, nosebleeds, rhinorrhea, sinus pressure, sneezing and trouble swallowing.   Respiratory: Positive for cough. Negative for chest tightness and shortness of breath.   Cardiovascular: Negative for chest pain and palpitations.  Skin: Negative for rash.    Objective:  BP 148/80 mmHg  Pulse 84  Temp(Src) 100.1 F (37.8 C) (Oral)  Ht 5\' 3"  (1.6 m)  Wt 167 lb 6.4 oz (75.932 kg)  BMI 29.66 kg/m2  LMP 06/28/2015  BP Readings from Last 3 Encounters:  07/12/15 148/80  07/28/14 168/85  02/20/14 156/97    Wt Readings from Last 3 Encounters:  07/12/15 167 lb 6.4 oz (75.932 kg)  07/28/14 192 lb (87.091 kg)  02/20/14 195 lb (88.451 kg)     Physical Exam  Constitutional: She is oriented to person, place, and time. She appears well-developed and well-nourished. No distress.  HENT:  Head: Normocephalic and atraumatic.  Right Ear: External ear normal.  Left Ear: External ear normal.  Nose: Nose normal.  Mouth/Throat: Oropharynx is clear and moist.  Eyes: Conjunctivae and EOM are normal. Pupils are equal, round, and reactive to light.  Neck: Normal range of motion. Neck supple. No thyromegaly present.  Cardiovascular: Normal rate, regular rhythm and normal heart sounds.   No murmur heard. Pulmonary/Chest: Effort normal. No respiratory distress. She has wheezes (bronchoalveolar changes noted). She has no rales.  Abdominal:  Soft. Bowel sounds are normal. She exhibits no distension. There is no tenderness.  Lymphadenopathy:    She has no cervical adenopathy.  Neurological: She is alert and oriented to person, place, and time. She has normal reflexes.  Skin: Skin is warm and dry.  Psychiatric: She has a normal mood and affect. Her behavior is normal. Judgment and thought content normal.    No results found  for: HGBA1C  Lab Results  Component Value Date   WBC 8.3 07/28/2014   HGB 12.4 07/28/2014   HCT 38.3 07/28/2014   PLT 312 07/28/2014   GLUCOSE 87 07/28/2014   CHOL 208* 07/28/2014   TRIG 223* 07/28/2014   HDL 39* 07/28/2014   LDLCALC 124* 07/28/2014   ALT 18 07/28/2014   AST 18 07/28/2014   NA 139 07/28/2014   K 4.3 07/28/2014   CL 100 07/28/2014   CREATININE 0.80 07/28/2014   BUN 15 07/28/2014   CO2 25 07/28/2014   TSH 0.602 07/28/2014    Patient was never admitted.  Assessment & Plan:   Klarisa was seen today for uri.  Diagnoses and all orders for this visit:  Sinobronchitis -     betamethasone acetate-betamethasone sodium phosphate (CELESTONE) injection 6 mg; Inject 1 mL (6 mg total) into the muscle once.  Other seasonal allergic rhinitis -     betamethasone acetate-betamethasone sodium phosphate (CELESTONE) injection 6 mg; Inject 1 mL (6 mg total) into the muscle once. -     fluticasone (FLONASE) 50 MCG/ACT nasal spray; Place 2 sprays into both nostrils daily.  Other orders -     levofloxacin (LEVAQUIN) 500 MG tablet; Take 1 tablet (500 mg total) by mouth daily. -     HYDROcodone-homatropine (HYCODAN) 5-1.5 MG/5ML syrup; Take 5 mLs by mouth every 6 (six) hours as needed for cough.   I have discontinued Ms. Norberto's phenazopyridine and Vitamin D (Ergocalciferol). I am also having her start on levofloxacin and HYDROcodone-homatropine. Additionally, I am having her maintain her levothyroxine, lisinopril, cloNIDine, and fluticasone. We administered betamethasone acetate-betamethasone sodium phosphate.  Meds ordered this encounter  Medications  . levofloxacin (LEVAQUIN) 500 MG tablet    Sig: Take 1 tablet (500 mg total) by mouth daily.    Dispense:  10 tablet    Refill:  0  . HYDROcodone-homatropine (HYCODAN) 5-1.5 MG/5ML syrup    Sig: Take 5 mLs by mouth every 6 (six) hours as needed for cough.    Dispense:  120 mL    Refill:  0  . betamethasone  acetate-betamethasone sodium phosphate (CELESTONE) injection 6 mg    Sig:   . fluticasone (FLONASE) 50 MCG/ACT nasal spray    Sig: Place 2 sprays into both nostrils daily.    Dispense:  16 g    Refill:  11     Follow-up: No Follow-up on file.  Claretta Fraise, M.D.

## 2015-07-13 ENCOUNTER — Ambulatory Visit (INDEPENDENT_AMBULATORY_CARE_PROVIDER_SITE_OTHER): Payer: BLUE CROSS/BLUE SHIELD

## 2015-07-13 ENCOUNTER — Other Ambulatory Visit: Payer: Self-pay | Admitting: Family Medicine

## 2015-07-13 DIAGNOSIS — R059 Cough, unspecified: Secondary | ICD-10-CM

## 2015-07-13 DIAGNOSIS — R05 Cough: Secondary | ICD-10-CM

## 2015-07-13 IMAGING — CR DG CHEST 2V
2 series · 2 of 2 positions shown · non-contrast
Comparison: No prior.

CLINICAL DATA: Cough and congestion.

EXAM:
CHEST  2 VIEW

[view not recorded (1 of 2)]
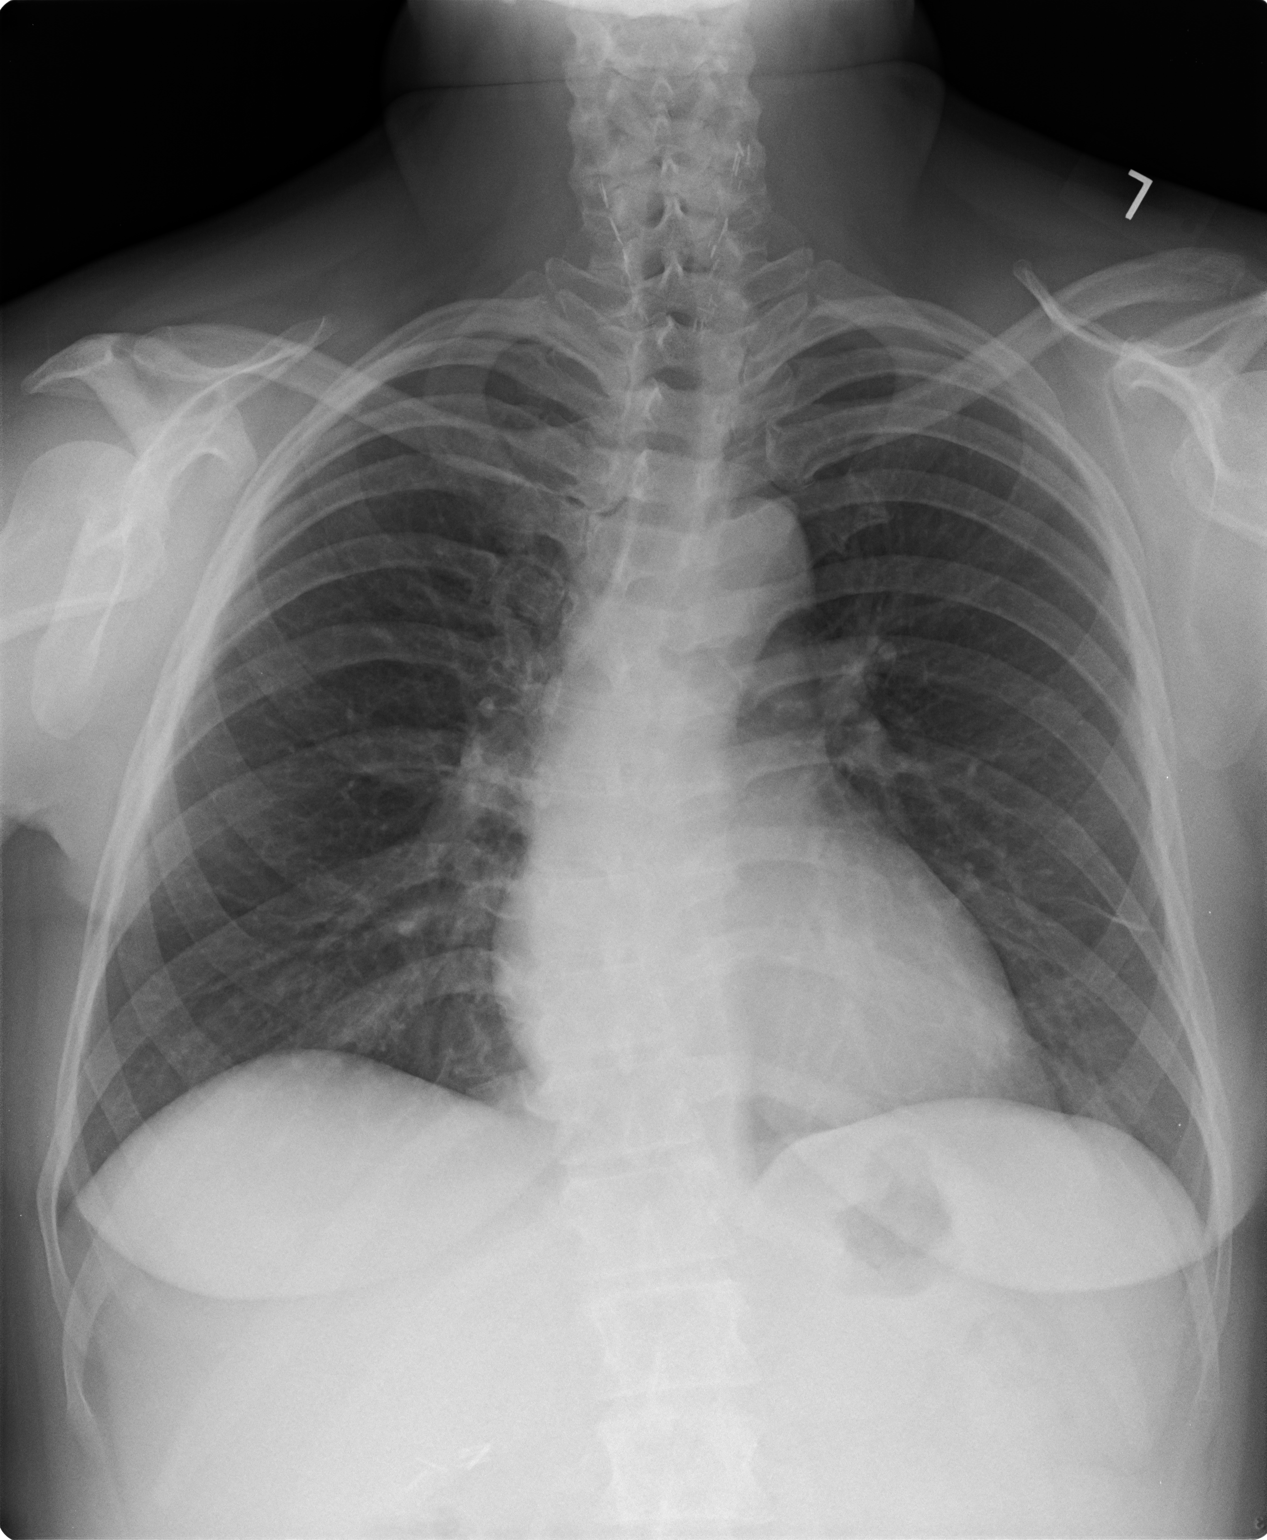

[view not recorded (2 of 2)]
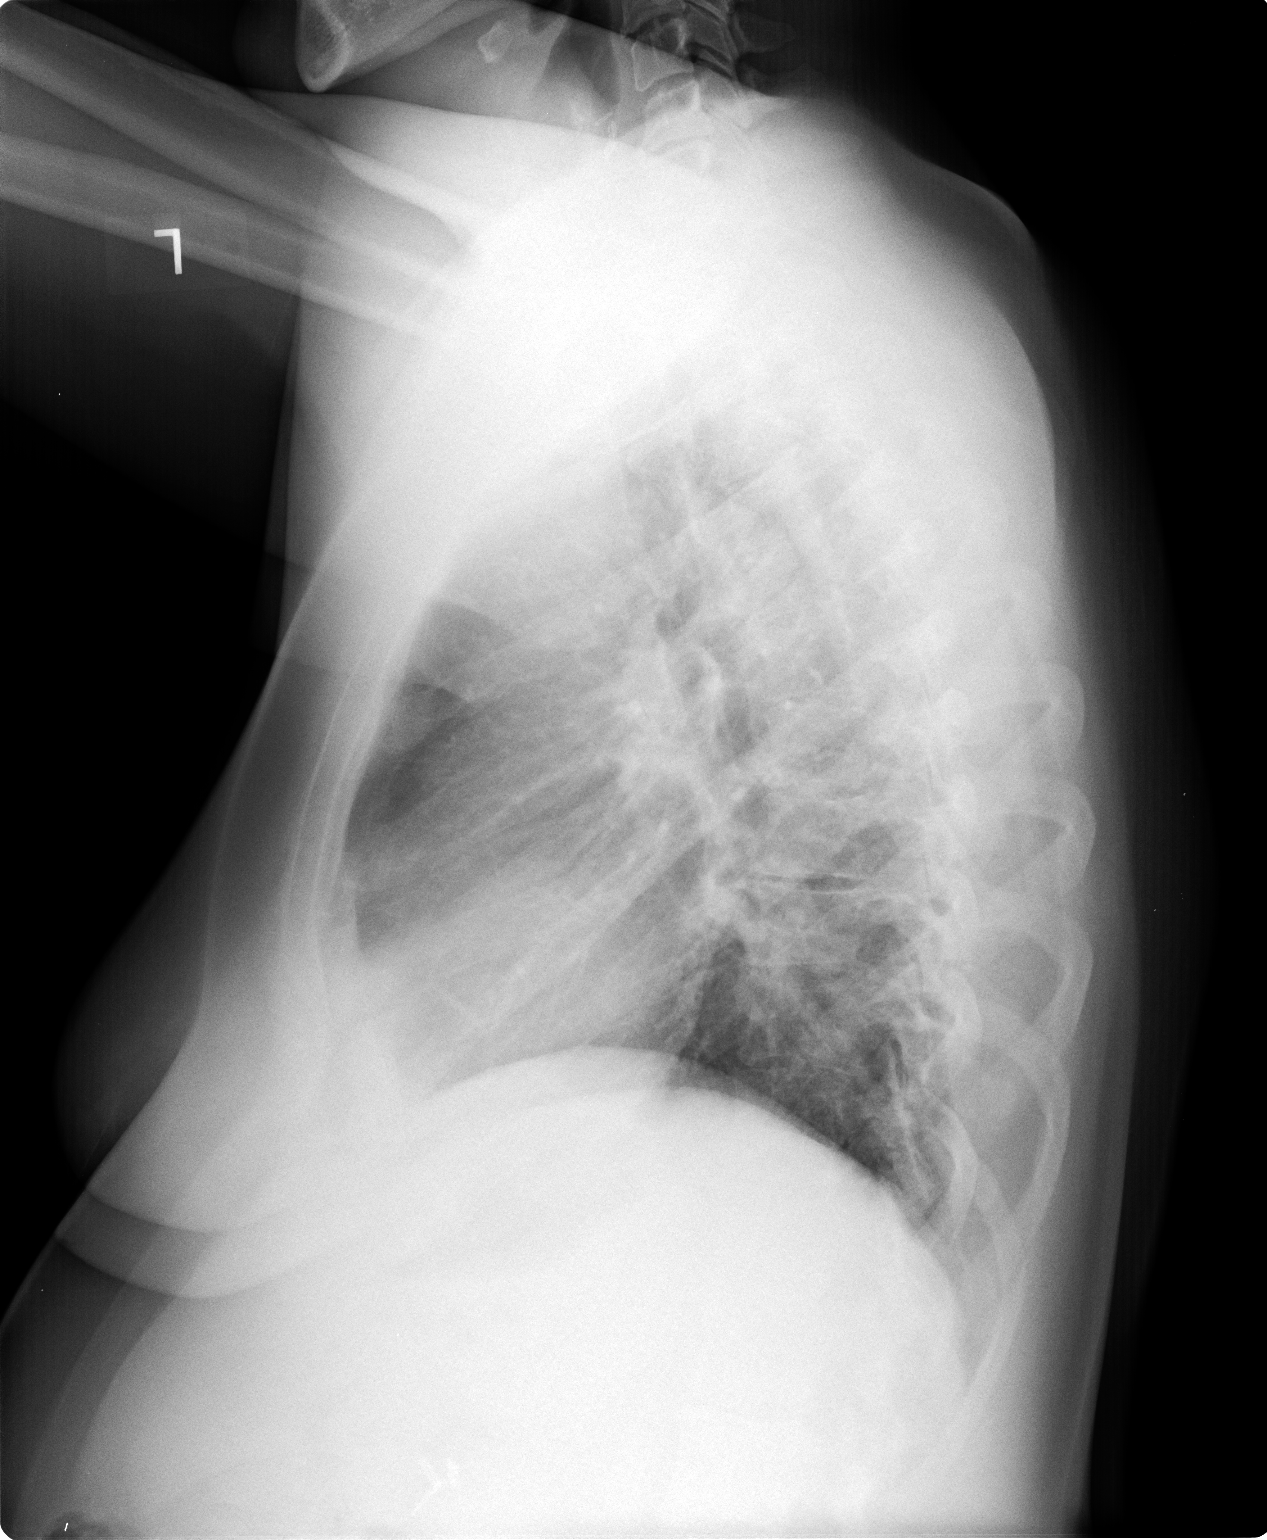

[2 of 2 positions shown; findings below may reference images not displayed]

FINDINGS: Mediastinum hilar structures normal. Cardiomegaly with normal
pulmonary vascularity. Left mid lung field subsegmental atelectasis
versus scarring. No focal infiltrate. No pleural effusion or
pneumothorax. Degenerative changes thoracic spine with prominent
scoliosis. Surgical clips right upper quadrant.
IMPRESSION: 1.  Left mid lung field subsegmental atelectasis and/or scarring.

2.  Cardiomegaly.  No pulmonary venous congestion.

## 2015-08-01 ENCOUNTER — Other Ambulatory Visit: Payer: Self-pay | Admitting: Family Medicine

## 2015-08-01 ENCOUNTER — Other Ambulatory Visit: Payer: Self-pay | Admitting: Nurse Practitioner

## 2015-10-24 ENCOUNTER — Encounter: Payer: Self-pay | Admitting: Family Medicine

## 2015-10-24 ENCOUNTER — Ambulatory Visit (INDEPENDENT_AMBULATORY_CARE_PROVIDER_SITE_OTHER): Payer: BLUE CROSS/BLUE SHIELD | Admitting: Family Medicine

## 2015-10-24 VITALS — BP 140/69 | HR 74 | Temp 98.3°F | Ht 63.0 in | Wt 164.2 lb

## 2015-10-24 DIAGNOSIS — E039 Hypothyroidism, unspecified: Secondary | ICD-10-CM | POA: Diagnosis not present

## 2015-10-24 DIAGNOSIS — I10 Essential (primary) hypertension: Secondary | ICD-10-CM

## 2015-10-24 NOTE — Addendum Note (Signed)
Addended by: Earlene Plater on: 10/24/2015 02:34 PM   Modules accepted: Miquel Dunn

## 2015-10-24 NOTE — Progress Notes (Signed)
Subjective:  Patient ID: Ann Peterson, female    DOB: [birth year]  Age: 50 y.o. MRN: [id number]  CC: Hypothyroidism and Hypertension   HPI Ann Peterson presents for  follow-up of hypertension. Patient has no history of headache chest pain or shortness of breath or recent cough. Patient also denies symptoms of TIA such as numbness weakness lateralizing. Patient checks  blood pressure at home and has not had any elevated readings recently. Patient denies side effects from medication. States taking it regularly.  Patient presents for follow-up on  thyroid. She has a history of hypothyroidism for many years. It has been stable recently. Pt. denies any change in  voice, loss of hair, heat or cold intolerance. Energy level has been adequate to good. She denies constipation and diarrhea. No myxedema. Medication is as noted below. Verified that pt is taking it daily on an empty stomach. Well tolerated.   History Ann Peterson has a past medical history of Hypertension; Thyroid disease; and Hyperlipidemia.   She has past surgical history that includes Total thyroidectomy.   Ann Peterson family history includes COPD in Ann Peterson father; Cancer in Ann Peterson brother and brother; Dementia in Ann Peterson mother; Hypertension in Ann Peterson mother; Macular degeneration in Ann Peterson mother; Stroke in Ann Peterson mother.She reports that she has never smoked. She has never used smokeless tobacco. She reports that she does not drink alcohol or use illicit drugs.  Current Outpatient Prescriptions on File Prior to Visit  Medication Sig Dispense Refill  . cloNIDine (CATAPRES) 0.1 MG tablet Take 1 tablet (0.1 mg total) by mouth 2 (two) times daily. 180 tablet 5  . fluticasone (FLONASE) 50 MCG/ACT nasal spray Place 2 sprays into both nostrils daily. 16 g 11  . levothyroxine (SYNTHROID, LEVOTHROID) 200 MCG tablet TAKE 1 TABLET (200 MCG TOTAL) BY MOUTH DAILY. 90 tablet 3  . lisinopril (PRINIVIL,ZESTRIL) 10 MG tablet TAKE 1 TABLET (10 MG TOTAL) BY MOUTH DAILY. 90 tablet 1    No current facility-administered medications on file prior to visit.    ROS Review of Systems  Constitutional: Negative for fever, activity change and appetite change.  HENT: Negative for congestion, rhinorrhea and sore throat.   Eyes: Negative for visual disturbance.  Respiratory: Negative for cough and shortness of breath.   Cardiovascular: Negative for chest pain and palpitations.  Gastrointestinal: Negative for nausea, abdominal pain and diarrhea.  Genitourinary: Negative for dysuria.  Musculoskeletal: Negative for myalgias and arthralgias.    Objective:  BP 140/69 mmHg  Pulse 74  Temp(Src) 98.3 F (36.8 C) (Oral)  Ht 5' 3" (1.6 m)  Wt 164 lb 3.2 oz (74.481 kg)  BMI 29.09 kg/m2  SpO2 98%  LMP 10/11/2015  BP Readings from Last 3 Encounters:  10/24/15 140/69  07/12/15 148/80  07/28/14 168/85    Wt Readings from Last 3 Encounters:  10/24/15 164 lb 3.2 oz (74.481 kg)  07/12/15 167 lb 6.4 oz (75.932 kg)  07/28/14 192 lb (87.091 kg)     Physical Exam  Constitutional: She is oriented to person, place, and time. She appears well-developed and well-nourished. No distress.  HENT:  Head: Normocephalic and atraumatic.  Right Ear: External ear normal.  Left Ear: External ear normal.  Nose: Nose normal.  Mouth/Throat: Oropharynx is clear and moist.  Eyes: Conjunctivae and EOM are normal. Pupils are equal, round, and reactive to light.  Neck: Normal range of motion. Neck supple. No thyromegaly present.  Cardiovascular: Normal rate, regular rhythm and normal heart sounds.   No murmur heard. Pulmonary/Chest: Effort normal  and breath sounds normal. No respiratory distress. She has no wheezes. She has no rales.  Abdominal: Soft. Bowel sounds are normal. She exhibits no distension. There is no tenderness.  Lymphadenopathy:    She has no cervical adenopathy.  Neurological: She is alert and oriented to person, place, and time. She has normal reflexes.  Skin: Skin is warm and  dry.  Psychiatric: She has a normal mood and affect. Ann Peterson behavior is normal. Judgment and thought content normal.     Lab Results  Component Value Date   WBC 8.3 07/28/2014   HGB 12.4 07/28/2014   HCT 38.3 07/28/2014   PLT 312 07/28/2014   GLUCOSE 87 07/28/2014   CHOL 208* 07/28/2014   TRIG 223* 07/28/2014   HDL 39* 07/28/2014   LDLCALC 124* 07/28/2014   ALT 18 07/28/2014   AST 18 07/28/2014   NA 139 07/28/2014   K 4.3 07/28/2014   CL 100 07/28/2014   CREATININE 0.80 07/28/2014   BUN 15 07/28/2014   CO2 25 07/28/2014   TSH 0.602 07/28/2014    Patient was never admitted.  Assessment & Plan:   Ann Peterson was seen today for hypothyroidism and hypertension.  Diagnoses and all orders for this visit:  Hypothyroidism, unspecified hypothyroidism type -     TSH + free T4; Standing -     CMP14+EGFR  Essential hypertension, benign -     CMP14+EGFR  I have discontinued Ann Peterson's levofloxacin and HYDROcodone-homatropine. I am also having Ann Peterson maintain Ann Peterson fluticasone, lisinopril, cloNIDine, and levothyroxine.  No orders of the defined types were placed in this encounter.      Follow-up: Return in about 6 months (around 04/22/2016).  Claretta Fraise, M.D.

## 2015-10-25 LAB — CMP14+EGFR
ALBUMIN: 4.3 g/dL (ref 3.5–5.5)
ALT: 17 IU/L (ref 0–32)
AST: 13 IU/L (ref 0–40)
Albumin/Globulin Ratio: 2.2 (ref 1.1–2.5)
Alkaline Phosphatase: 52 IU/L (ref 39–117)
BUN/Creatinine Ratio: 22 (ref 9–23)
BUN: 15 mg/dL (ref 6–24)
Bilirubin Total: 0.4 mg/dL (ref 0.0–1.2)
CALCIUM: 8.7 mg/dL (ref 8.7–10.2)
CO2: 25 mmol/L (ref 18–29)
CREATININE: 0.68 mg/dL (ref 0.57–1.00)
Chloride: 100 mmol/L (ref 96–106)
GFR calc Af Amer: 120 mL/min/{1.73_m2} (ref >59)
GFR, EST NON AFRICAN AMERICAN: 104 mL/min/{1.73_m2} (ref >59)
GLOBULIN, TOTAL: 2 g/dL (ref 1.5–4.5)
GLUCOSE: 83 mg/dL (ref 65–99)
Potassium: 4.7 mmol/L (ref 3.5–5.2)
SODIUM: 137 mmol/L (ref 134–144)
Total Protein: 6.3 g/dL (ref 6.0–8.5)

## 2015-10-31 ENCOUNTER — Telehealth: Payer: Self-pay | Admitting: Family Medicine

## 2015-10-31 LAB — SPECIMEN STATUS REPORT

## 2016-02-08 ENCOUNTER — Other Ambulatory Visit: Payer: Self-pay | Admitting: Family Medicine

## 2016-03-28 DIAGNOSIS — L570 Actinic keratosis: Secondary | ICD-10-CM | POA: Diagnosis not present

## 2016-03-28 DIAGNOSIS — L738 Other specified follicular disorders: Secondary | ICD-10-CM | POA: Diagnosis not present

## 2016-05-06 ENCOUNTER — Other Ambulatory Visit: Payer: Self-pay | Admitting: Family Medicine

## 2016-05-08 NOTE — Telephone Encounter (Signed)
Authorize 30 days only. Then contact the patient letting them know that they will need an appointment before any further prescriptions can be sent in. 

## 2016-07-03 DIAGNOSIS — N924 Excessive bleeding in the premenopausal period: Secondary | ICD-10-CM | POA: Diagnosis not present

## 2016-07-03 DIAGNOSIS — D509 Iron deficiency anemia, unspecified: Secondary | ICD-10-CM | POA: Diagnosis not present

## 2016-07-03 DIAGNOSIS — N926 Irregular menstruation, unspecified: Secondary | ICD-10-CM | POA: Diagnosis not present

## 2016-07-31 ENCOUNTER — Other Ambulatory Visit: Payer: Self-pay | Admitting: Family Medicine

## 2016-08-03 ENCOUNTER — Other Ambulatory Visit: Payer: Self-pay | Admitting: Family Medicine

## 2016-08-07 ENCOUNTER — Other Ambulatory Visit: Payer: Self-pay | Admitting: Family Medicine

## 2016-08-07 DIAGNOSIS — J302 Other seasonal allergic rhinitis: Secondary | ICD-10-CM

## 2016-09-10 ENCOUNTER — Other Ambulatory Visit: Payer: Self-pay | Admitting: Family Medicine

## 2016-09-10 DIAGNOSIS — J302 Other seasonal allergic rhinitis: Secondary | ICD-10-CM

## 2016-09-12 DIAGNOSIS — N925 Other specified irregular menstruation: Secondary | ICD-10-CM | POA: Diagnosis not present

## 2016-09-12 DIAGNOSIS — N921 Excessive and frequent menstruation with irregular cycle: Secondary | ICD-10-CM | POA: Diagnosis not present

## 2016-09-12 DIAGNOSIS — N888 Other specified noninflammatory disorders of cervix uteri: Secondary | ICD-10-CM | POA: Diagnosis not present

## 2016-09-12 DIAGNOSIS — N920 Excessive and frequent menstruation with regular cycle: Secondary | ICD-10-CM | POA: Diagnosis not present

## 2016-09-12 DIAGNOSIS — N736 Female pelvic peritoneal adhesions (postinfective): Secondary | ICD-10-CM | POA: Diagnosis not present

## 2016-09-12 DIAGNOSIS — D259 Leiomyoma of uterus, unspecified: Secondary | ICD-10-CM | POA: Diagnosis not present

## 2016-09-12 HISTORY — PX: ABDOMINAL HYSTERECTOMY: SHX81

## 2016-10-23 ENCOUNTER — Encounter: Payer: Self-pay | Admitting: Family Medicine

## 2016-10-23 ENCOUNTER — Ambulatory Visit (INDEPENDENT_AMBULATORY_CARE_PROVIDER_SITE_OTHER): Payer: BLUE CROSS/BLUE SHIELD | Admitting: Family Medicine

## 2016-10-23 VITALS — BP 115/68 | HR 67 | Temp 99.0°F | Ht 63.0 in | Wt 180.0 lb

## 2016-10-23 DIAGNOSIS — E890 Postprocedural hypothyroidism: Secondary | ICD-10-CM | POA: Diagnosis not present

## 2016-10-23 DIAGNOSIS — I10 Essential (primary) hypertension: Secondary | ICD-10-CM | POA: Diagnosis not present

## 2016-10-23 DIAGNOSIS — J302 Other seasonal allergic rhinitis: Secondary | ICD-10-CM

## 2016-10-23 DIAGNOSIS — Z0000 Encounter for general adult medical examination without abnormal findings: Secondary | ICD-10-CM | POA: Diagnosis not present

## 2016-10-23 DIAGNOSIS — Z9009 Acquired absence of other part of head and neck: Secondary | ICD-10-CM

## 2016-10-23 LAB — URINALYSIS
Bilirubin, UA: NEGATIVE
Glucose, UA: NEGATIVE
Ketones, UA: NEGATIVE
NITRITE UA: NEGATIVE
PH UA: 5.5 (ref 5.0–7.5)
PROTEIN UA: NEGATIVE
Specific Gravity, UA: 1.02 (ref 1.005–1.030)
Urobilinogen, Ur: 0.2 mg/dL (ref 0.2–1.0)

## 2016-10-24 LAB — THYROID PANEL WITH TSH
Free Thyroxine Index: 3.8 (ref 1.2–4.9)
T3 Uptake Ratio: 31 % (ref 24–39)
T4 TOTAL: 12.4 ug/dL — AB (ref 4.5–12.0)
TSH: 0.12 u[IU]/mL — AB (ref 0.450–4.500)

## 2016-10-24 LAB — CBC WITH DIFFERENTIAL/PLATELET
BASOS: 0 %
Basophils Absolute: 0 10*3/uL (ref 0.0–0.2)
EOS (ABSOLUTE): 0.1 10*3/uL (ref 0.0–0.4)
EOS: 2 %
HEMOGLOBIN: 12.4 g/dL (ref 11.1–15.9)
Hematocrit: 38.2 % (ref 34.0–46.6)
IMMATURE GRANS (ABS): 0 10*3/uL (ref 0.0–0.1)
IMMATURE GRANULOCYTES: 0 %
LYMPHS: 21 %
Lymphocytes Absolute: 1.2 10*3/uL (ref 0.7–3.1)
MCH: 27.6 pg (ref 26.6–33.0)
MCHC: 32.5 g/dL (ref 31.5–35.7)
MCV: 85 fL (ref 79–97)
MONOCYTES: 8 %
Monocytes Absolute: 0.5 10*3/uL (ref 0.1–0.9)
NEUTROS ABS: 4.2 10*3/uL (ref 1.4–7.0)
NEUTROS PCT: 69 %
PLATELETS: 295 10*3/uL (ref 150–379)
RBC: 4.5 x10E6/uL (ref 3.77–5.28)
RDW: 14.5 % (ref 12.3–15.4)
WBC: 6 10*3/uL (ref 3.4–10.8)

## 2016-10-24 LAB — CMP14+EGFR
ALBUMIN: 4.3 g/dL (ref 3.5–5.5)
ALT: 13 IU/L (ref 0–32)
AST: 10 IU/L (ref 0–40)
Albumin/Globulin Ratio: 2 (ref 1.2–2.2)
Alkaline Phosphatase: 72 IU/L (ref 39–117)
BILIRUBIN TOTAL: 0.2 mg/dL (ref 0.0–1.2)
BUN / CREAT RATIO: 21 (ref 9–23)
BUN: 14 mg/dL (ref 6–24)
CO2: 22 mmol/L (ref 18–29)
CREATININE: 0.66 mg/dL (ref 0.57–1.00)
Calcium: 9 mg/dL (ref 8.7–10.2)
Chloride: 101 mmol/L (ref 96–106)
GFR calc non Af Amer: 104 mL/min/{1.73_m2} (ref >59)
GFR, EST AFRICAN AMERICAN: 120 mL/min/{1.73_m2} (ref >59)
GLOBULIN, TOTAL: 2.1 g/dL (ref 1.5–4.5)
GLUCOSE: 110 mg/dL — AB (ref 65–99)
Potassium: 4.3 mmol/L (ref 3.5–5.2)
SODIUM: 139 mmol/L (ref 134–144)
TOTAL PROTEIN: 6.4 g/dL (ref 6.0–8.5)

## 2016-10-24 LAB — LIPID PANEL
Chol/HDL Ratio: 6.2 ratio units — ABNORMAL HIGH (ref 0.0–4.4)
Cholesterol, Total: 224 mg/dL — ABNORMAL HIGH (ref 100–199)
HDL: 36 mg/dL — AB (ref >39)
LDL CALC: 148 mg/dL — AB (ref 0–99)
Triglycerides: 201 mg/dL — ABNORMAL HIGH (ref 0–149)
VLDL CHOLESTEROL CAL: 40 mg/dL (ref 5–40)

## 2016-10-24 NOTE — Progress Notes (Signed)
Please contact the patient regarding: Her thyroid functions are actually elevated. We had talked about them being potentially low. That is not the case. Since her symptoms are more reflective of low as just keep an eye on it for several weeks. She should drop by in about 6-8 weeks for a repeat. If they are still high a slight cut back in medicine may be appropriate. Oddly enough sometimes an overactive thyroid can cause symptoms more associated with low thyroid due to interaction with the adrenal gland. It's too soon to assume something like that is going on though. If it continues cutting back may actually relieve her symptoms. We'll find out in a few weeks.  Please put in a future free T4 and TSH for 6-8 weeks from now.

## 2016-10-25 MED ORDER — CLONIDINE HCL 0.1 MG PO TABS: 0.1000 mg | ORAL_TABLET | Freq: Two times a day (BID) | ORAL | 5 refills | Status: DC

## 2016-10-25 MED ORDER — LEVOTHYROXINE SODIUM 200 MCG PO TABS: ORAL_TABLET | ORAL | 0 refills | Status: DC

## 2016-10-25 MED ORDER — LISINOPRIL 10 MG PO TABS: ORAL_TABLET | ORAL | 0 refills | Status: DC

## 2016-10-25 MED ORDER — FLUTICASONE PROPIONATE 50 MCG/ACT NA SUSP: 2.0000 | Freq: Every day | NASAL | 0 refills | Status: DC

## 2016-10-29 NOTE — Progress Notes (Signed)
Subjective:  Patient ID: Ann Peterson, female    DOB: 12-Nov-1965  Age: 51 y.o. MRN: [id number]  CC: Annual Exam (pt here today for CPE, recently had a hysterectomy, she needs refills on her meds and bloodwork.)   HPI Ann Peterson presents for CPE. No pap needed.  follow-up of hypertension. Patient has no history of headache chest pain or shortness of breath or recent cough. Patient also denies symptoms of TIA such as numbness weakness lateralizing. Patient checks  blood pressure at home and has not had any elevated readings recently. Patient denies side effects from his medication. States taking it regularly. Patient presents for follow-up on  thyroid. The patient has a history of hypothyroidism for many years. It has been stable recently. Pt. denies any change in  voice, loss of hair, . Energy level has been somewhat diminished Patient denies constipation and diarrhea. Some cold intolerance seems worse than usual. No myxedema. Medication is as noted below. Verified that pt is taking it daily on an empty stomach. Well tolerated.     History Ann Peterson has a past medical history of Hyperlipidemia; Hypertension; and Thyroid disease.   She has a past surgical history that includes Total thyroidectomy.   Her family history includes COPD in her father; Cancer in her brother and brother; Dementia in her mother; Hypertension in her mother; Macular degeneration in her mother; Stroke in her mother.She reports that she has never smoked. She has never used smokeless tobacco. She reports that she does not drink alcohol or use drugs.    ROS Review of Systems  Constitutional: Negative for appetite change, chills, diaphoresis, fatigue, fever and unexpected weight change.  HENT: Negative for congestion, ear pain, hearing loss, postnasal drip, rhinorrhea, sneezing, sore throat and trouble swallowing.   Eyes: Negative for pain.  Respiratory: Negative for cough, chest tightness and shortness of breath.     Cardiovascular: Negative for chest pain and palpitations.  Gastrointestinal: Negative for abdominal pain, constipation, diarrhea, nausea and vomiting.  Endocrine: Negative for cold intolerance, heat intolerance, polydipsia, polyphagia and polyuria.  Genitourinary: Negative for dysuria, frequency and menstrual problem.  Musculoskeletal: Negative for arthralgias and joint swelling.  Skin: Negative for rash.  Allergic/Immunologic: Negative for environmental allergies.  Neurological: Negative for dizziness, weakness, numbness and headaches.  Psychiatric/Behavioral: Negative for agitation and dysphoric mood.    Objective:  BP 115/68   Pulse 67   Temp 99 F (37.2 C) (Oral)   Ht 5' 3"  (1.6 m)   Wt 180 lb (81.6 kg)   LMP 09/07/2016 (Exact Date)   BMI 31.89 kg/m   BP Readings from Last 3 Encounters:  10/23/16 115/68  10/24/15 140/69  07/12/15 (!) 148/80    Wt Readings from Last 3 Encounters:  10/23/16 180 lb (81.6 kg)  10/24/15 164 lb 3.2 oz (74.5 kg)  07/12/15 167 lb 6.4 oz (75.9 kg)     Physical Exam  Constitutional: She is oriented to person, place, and time. She appears well-developed and well-nourished. No distress.  HENT:  Head: Normocephalic and atraumatic.  Right Ear: External ear normal.  Left Ear: External ear normal.  Nose: Nose normal.  Mouth/Throat: Oropharynx is clear and moist.  Eyes: Conjunctivae and EOM are normal. Pupils are equal, round, and reactive to light.  Neck: Normal range of motion. Neck supple. No thyromegaly present.  Cardiovascular: Normal rate, regular rhythm and normal heart sounds.   No murmur heard. Pulmonary/Chest: Effort normal and breath sounds normal. No respiratory distress. She has no wheezes. She  has no rales. Right breast exhibits no inverted nipple, no mass and no tenderness. Left breast exhibits no inverted nipple, no mass and no tenderness. Breasts are symmetrical.  Abdominal: Soft. Normal appearance and bowel sounds are normal.  She exhibits no distension, no abdominal bruit and no mass. There is no splenomegaly or hepatomegaly. There is no tenderness. There is no tenderness at McBurney's point and negative Murphy's sign.  Genitourinary: Rectum normal. Rectal exam shows no external hemorrhoid, no internal hemorrhoid, no mass and no tenderness.  Musculoskeletal: Normal range of motion. She exhibits no edema or tenderness.  Lymphadenopathy:    She has no cervical adenopathy.  Neurological: She is alert and oriented to person, place, and time. She has normal reflexes.  Skin: Skin is warm and dry. No rash noted.  Psychiatric: She has a normal mood and affect. Her behavior is normal. Judgment and thought content normal.    Patient was never admitted.  Assessment & Plan:   Ann Peterson was seen today for annual exam.  Diagnoses and all orders for this visit:  Well adult exam  Essential hypertension, benign -     CBC with Differential/Platelet -     Lipid panel -     CMP14+EGFR  H/O thyroidectomy -     Thyroid Panel With TSH -     Urinalysis  Other seasonal allergic rhinitis -     fluticasone (FLONASE) 50 MCG/ACT nasal spray; Place 2 sprays into both nostrils daily.  Other orders -     cloNIDine (CATAPRES) 0.1 MG tablet; Take 1 tablet (0.1 mg total) by mouth 2 (two) times daily. -     levothyroxine (SYNTHROID, LEVOTHROID) 200 MCG tablet; TAKE 1 TABLET (200 MCG TOTAL) BY MOUTH DAILY. -     lisinopril (PRINIVIL,ZESTRIL) 10 MG tablet; TAKE 1 TABLET (10 MG TOTAL) BY MOUTH DAILY.    I am having Ann Peterson maintain her fluticasone, cloNIDine, levothyroxine, and lisinopril.  Allergies as of 10/23/2016   No Known Allergies     Medication List       Accurate as of 10/23/16 11:59 PM. Always use your most recent med list.          cloNIDine 0.1 MG tablet Commonly known as:  CATAPRES Take 1 tablet (0.1 mg total) by mouth 2 (two) times daily.   fluticasone 50 MCG/ACT nasal spray Commonly known as:   FLONASE Place 2 sprays into both nostrils daily.   levothyroxine 200 MCG tablet Commonly known as:  SYNTHROID, LEVOTHROID TAKE 1 TABLET (200 MCG TOTAL) BY MOUTH DAILY.   lisinopril 10 MG tablet Commonly known as:  PRINIVIL,ZESTRIL TAKE 1 TABLET (10 MG TOTAL) BY MOUTH DAILY.        Follow-up: Return in about 6 months (around 04/22/2017).  Claretta Fraise, M.D.

## 2016-11-01 ENCOUNTER — Other Ambulatory Visit: Payer: Self-pay | Admitting: Family Medicine

## 2017-01-11 ENCOUNTER — Other Ambulatory Visit: Payer: Self-pay | Admitting: Family Medicine

## 2017-01-11 DIAGNOSIS — J302 Other seasonal allergic rhinitis: Secondary | ICD-10-CM

## 2017-01-19 ENCOUNTER — Encounter: Payer: Self-pay | Admitting: Nurse Practitioner

## 2017-01-19 ENCOUNTER — Ambulatory Visit (INDEPENDENT_AMBULATORY_CARE_PROVIDER_SITE_OTHER): Payer: BLUE CROSS/BLUE SHIELD | Admitting: Nurse Practitioner

## 2017-01-19 VITALS — BP 140/82 | HR 71 | Temp 99.4°F | Resp 22 | Ht 63.0 in | Wt 185.6 lb

## 2017-01-19 DIAGNOSIS — R300 Dysuria: Secondary | ICD-10-CM | POA: Insufficient documentation

## 2017-01-19 DIAGNOSIS — J40 Bronchitis, not specified as acute or chronic: Secondary | ICD-10-CM

## 2017-01-19 DIAGNOSIS — N852 Hypertrophy of uterus: Secondary | ICD-10-CM | POA: Insufficient documentation

## 2017-01-19 DIAGNOSIS — J0110 Acute frontal sinusitis, unspecified: Secondary | ICD-10-CM | POA: Diagnosis not present

## 2017-01-19 DIAGNOSIS — N920 Excessive and frequent menstruation with regular cycle: Secondary | ICD-10-CM | POA: Insufficient documentation

## 2017-01-19 MED ORDER — AZITHROMYCIN 250 MG PO TABS: ORAL_TABLET | ORAL | 0 refills | Status: DC

## 2017-01-19 MED ORDER — HYDROCODONE-HOMATROPINE 5-1.5 MG/5ML PO SYRP: 5.0000 mL | ORAL_SOLUTION | Freq: Four times a day (QID) | ORAL | 0 refills | Status: DC | PRN

## 2017-01-19 NOTE — Patient Instructions (Signed)

## 2017-01-19 NOTE — Progress Notes (Signed)
Subjective:    Patient ID: Ann Peterson, female    DOB: 08-Jan-1966, 51 y.o.   MRN: [id number]  HPI Patient comes in today c/o cough, congestion and headache. She has been on claritin and flonase for ver a  Month and this just started 4 days ago.    Review of Systems  Constitutional: Positive for fever. Negative for chills.  HENT: Positive for congestion, rhinorrhea, sinus pain and sinus pressure. Negative for ear pain, sore throat and trouble swallowing.   Respiratory: Positive for cough (productive).   Cardiovascular: Negative.   Gastrointestinal: Negative.   Genitourinary: Negative.   Neurological: Negative.   Psychiatric/Behavioral: Negative.   All other systems reviewed and are negative.      Objective:   Physical Exam  Constitutional: She is oriented to person, place, and time. She appears well-developed and well-nourished.  HENT:  Right Ear: Hearing, tympanic membrane, external ear and ear canal normal.  Left Ear: Hearing, tympanic membrane, external ear and ear canal normal.  Nose: Mucosal edema and rhinorrhea present. Right sinus exhibits maxillary sinus tenderness. Left sinus exhibits maxillary sinus tenderness.  Mouth/Throat: Uvula is midline, oropharynx is clear and moist and mucous membranes are normal.  Neck: Normal range of motion. Neck supple.  Cardiovascular: Normal rate and regular rhythm.   Pulmonary/Chest: Effort normal.  Deep wet cough  Abdominal: Soft. Bowel sounds are normal.  Lymphadenopathy:    She has no cervical adenopathy.  Neurological: She is alert and oriented to person, place, and time.  Skin: Skin is warm.  Psychiatric: She has a normal mood and affect. Her behavior is normal. Judgment and thought content normal.   BP 140/82   Pulse 71   Temp 99.4 F (37.4 C) (Oral)   Resp (!) 22   Ht 5\' 3"  (1.6 m)   Wt 185 lb 9.6 oz (84.2 kg)   LMP 09/07/2016 (Exact Date)   SpO2 97%   BMI 32.88 kg/m         Assessment & Plan:   1. Acute  frontal sinusitis, recurrence not specified   2. Bronchitis    Meds ordered this encounter  Medications  . azithromycin (ZITHROMAX Z-PAK) 250 MG tablet    Sig: As directed    Dispense:  6 tablet    Refill:  0    Order Specific Question:   Supervising Provider    Answer:   Ricard Dillon [[id number]]  . HYDROcodone-homatropine (HYCODAN) 5-1.5 MG/5ML syrup    Sig: Take 5 mLs by mouth every 6 (six) hours as needed for cough.    Dispense:  120 mL    Refill:  0    Order Specific Question:   Supervising Provider    Answer:   Benay Pillow E [[id number]]   1. Take meds as prescribed 2. Use a cool mist humidifier especially during the winter months and when heat has been humid. 3. Use saline nose sprays frequently 4. Saline irrigations of the nose can be very helpful if done frequently.  * 4X daily for 1 week*  * Use of a nettie pot can be helpful with this. Follow directions with this* 5. Drink plenty of fluids 6. Keep thermostat turn down low 7.For any cough or congestion  Use plain Mucinex- regular strength or max strength is fine   * Children- consult with Pharmacist for dosing 8. For fever or aces or pains- take tylenol or ibuprofen appropriate for age and weight.  * for fevers greater than 101 orally you may  alternate ibuprofen and tylenol every  3 hours.   ' [place], [place]

## 2017-01-27 ENCOUNTER — Other Ambulatory Visit: Payer: Self-pay | Admitting: Family Medicine

## 2017-01-28 ENCOUNTER — Telehealth: Payer: Self-pay | Admitting: Family Medicine

## 2017-02-01 ENCOUNTER — Telehealth: Payer: Self-pay | Admitting: Family Medicine

## 2017-02-01 NOTE — Telephone Encounter (Signed)
Called pt, done on 01/29/17

## 2017-02-01 NOTE — Telephone Encounter (Signed)
What is the name of the medication? Synthroid, lisinopril  Have you contacted your pharmacy to request a refill? yes  Which pharmacy would you like this sent to? [facility]   Patient notified that their request is being sent to the clinical staff for review and that they should receive a call once it is complete. If they do not receive a call within 24 hours they can check with their pharmacy or our office.

## 2017-03-15 ENCOUNTER — Other Ambulatory Visit: Payer: Self-pay | Admitting: Family Medicine

## 2017-03-15 DIAGNOSIS — J302 Other seasonal allergic rhinitis: Secondary | ICD-10-CM

## 2017-03-20 ENCOUNTER — Encounter: Payer: Self-pay | Admitting: Family Medicine

## 2017-05-29 ENCOUNTER — Telehealth: Payer: Self-pay | Admitting: Family Medicine

## 2017-05-29 NOTE — Telephone Encounter (Signed)
Left message pt due for mammo if she would call back to schedule or let us know if she gets them done somewhere else or if she declines

## 2017-07-16 DIAGNOSIS — N760 Acute vaginitis: Secondary | ICD-10-CM | POA: Diagnosis not present

## 2017-07-18 ENCOUNTER — Ambulatory Visit: Payer: BLUE CROSS/BLUE SHIELD | Admitting: Family

## 2017-07-18 ENCOUNTER — Telehealth: Payer: Self-pay | Admitting: Family Medicine

## 2017-07-18 ENCOUNTER — Encounter: Payer: Self-pay | Admitting: Family

## 2017-07-18 VITALS — BP 159/86 | HR 72 | Temp 99.3°F | Ht 63.0 in | Wt 199.2 lb

## 2017-07-18 DIAGNOSIS — J029 Acute pharyngitis, unspecified: Secondary | ICD-10-CM

## 2017-07-18 DIAGNOSIS — J020 Streptococcal pharyngitis: Secondary | ICD-10-CM | POA: Diagnosis not present

## 2017-07-18 LAB — RAPID STREP SCREEN (MED CTR MEBANE ONLY): STREP GP A AG, IA W/REFLEX: POSITIVE — AB

## 2017-07-18 MED ORDER — AMOXICILLIN 875 MG PO TABS: 875.0000 mg | ORAL_TABLET | Freq: Two times a day (BID) | ORAL | 0 refills | Status: DC

## 2017-07-18 MED ORDER — FLUCONAZOLE 150 MG PO TABS: 150.0000 mg | ORAL_TABLET | ORAL | 0 refills | Status: DC | PRN

## 2017-07-18 NOTE — Patient Instructions (Signed)
Strep Throat Strep throat is a bacterial infection of the throat. Your health care provider may call the infection tonsillitis or pharyngitis, depending on whether there is swelling in the tonsils or at the back of the throat. Strep throat is most common during the cold months of the year in children who are 5-50 years of age, but it can happen during any season in people of any age. This infection is spread from person to person (contagious) through coughing, sneezing, or close contact. What are the causes? Strep throat is caused by the bacteria called Streptococcus pyogenes. What increases the risk? This condition is more likely to develop in:  People who spend time in crowded places where the infection can spread easily.  People who have close contact with someone who has strep throat.  What are the signs or symptoms? Symptoms of this condition include:  Fever or chills.  Redness, swelling, or pain in the tonsils or throat.  Pain or difficulty when swallowing.  White or yellow spots on the tonsils or throat.  Swollen, tender glands in the neck or under the jaw.  Red rash all over the body (rare).  How is this diagnosed? This condition is diagnosed by performing a rapid strep test or by taking a swab of your throat (throat culture test). Results from a rapid strep test are usually ready in a few minutes, but throat culture test results are available after one or two days. How is this treated? This condition is treated with antibiotic medicine. Follow these instructions at home: Medicines  Take over-the-counter and prescription medicines only as told by your health care provider.  Take your antibiotic as told by your health care provider. Do not stop taking the antibiotic even if you start to feel better.  Have family members who also have a sore throat or fever tested for strep throat. They may need antibiotics if they have the strep infection. Eating and drinking  Do not  share food, drinking cups, or personal items that could cause the infection to spread to other people.  If swallowing is difficult, try eating soft foods until your sore throat feels better.  Drink enough fluid to keep your urine clear or pale yellow. General instructions  Gargle with a salt-water mixture 3-4 times per day or as needed. To make a salt-water mixture, completely dissolve -1 tsp of salt in 1 cup of warm water.  Make sure that all household members wash their hands well.  Get plenty of rest.  Stay home from school or work until you have been taking antibiotics for 24 hours.  Keep all follow-up visits as told by your health care provider. This is important. Contact a health care provider if:  The glands in your neck continue to get bigger.  You develop a rash, cough, or earache.  You cough up a thick liquid that is green, yellow-brown, or bloody.  You have pain or discomfort that does not get better with medicine.  Your problems seem to be getting worse rather than better.  You have a fever. Get help right away if:  You have new symptoms, such as vomiting, severe headache, stiff or painful neck, chest pain, or shortness of breath.  You have severe throat pain, drooling, or changes in your voice.  You have swelling of the neck, or the skin on the neck becomes red and tender.  You have signs of dehydration, such as fatigue, dry mouth, and decreased urination.  You become increasingly sleepy, or   you cannot wake up completely.  Your joints become red or painful. This information is not intended to replace advice given to you by your health care provider. Make sure you discuss any questions you have with your health care provider. Document Released: 08/24/2000 Document Revised: 04/25/2016 Document Reviewed: 12/20/2014 Elsevier Interactive Patient Education  2017 [employer]  

## 2017-07-18 NOTE — Progress Notes (Signed)
   Subjective:    Patient ID: Ann Peterson, female    DOB: November 11, 1965, 51 y.o.   MRN: [id number]  Sore Throat   This is a new problem. The current episode started in the past 7 days. The problem has been gradually worsening. Maximum temperature: 99.3. The pain is at a severity of 7/10. The pain is moderate. Associated symptoms include coughing, ear pain (right), headaches, a hoarse voice, a plugged ear sensation, swollen glands and trouble swallowing. Pertinent negatives include no congestion or ear discharge. She has tried acetaminophen and NSAIDs for the symptoms. The treatment provided mild relief.      Review of Systems  HENT: Positive for ear pain (right), hoarse voice and trouble swallowing. Negative for congestion and ear discharge.   Respiratory: Positive for cough.   Neurological: Positive for headaches.  All other systems reviewed and are negative.      Objective:   Physical Exam  Constitutional: She is oriented to person, place, and time. She appears well-developed and well-nourished. No distress.  HENT:  Head: Normocephalic and atraumatic.  Right Ear: External ear normal. Tympanic membrane is bulging.  Left Ear: External ear normal. Tympanic membrane is bulging.  Nose: Mucosal edema and rhinorrhea present.  Mouth/Throat: Posterior oropharyngeal erythema present.  Eyes: Pupils are equal, round, and reactive to light.  Neck: Normal range of motion. Neck supple. No thyromegaly present.  Cardiovascular: Normal rate, regular rhythm, normal heart sounds and intact distal pulses.  No murmur heard. Pulmonary/Chest: Effort normal and breath sounds normal. No respiratory distress. She has no wheezes.  Abdominal: Soft. Bowel sounds are normal. She exhibits no distension. There is no tenderness.  Musculoskeletal: Normal range of motion. She exhibits no edema or tenderness.  Lymphadenopathy:    She has cervical adenopathy.  Neurological: She is alert and oriented to person, place,  and time. She has normal reflexes. No cranial nerve deficit.  Skin: Skin is warm and dry.  Psychiatric: She has a normal mood and affect. Her behavior is normal. Judgment and thought content normal.  Vitals reviewed.    BP (!) 159/86   Pulse 72   Temp 99.3 F (37.4 C) (Oral)   Ht 5\' 3"  (1.6 m)   Wt 199 lb 3.2 oz (90.4 kg)   LMP 09/07/2016 (Exact Date)   BMI 35.29 kg/m      Assessment & Plan:  1. Sore throat - Rapid strep screen (not at [facility])  2. Strep throat - Take meds as prescribed - Use a cool mist humidifier  -Use saline nose sprays frequently -Force fluids -For any cough or congestion -For fever or aces or pains- take tylenol or ibuprofen appropriate for age and weight. -Throat lozenges if help -New toothbrush in 3 days - amoxicillin (AMOXIL) 875 MG tablet; Take 1 tablet (875 mg total) 2 (two) times daily by mouth.  Dispense: 20 tablet; Refill: 0   Evelina Dun, FNP

## 2017-07-18 NOTE — Telephone Encounter (Signed)
Pt aware rx sent into pharmacy. 

## 2017-07-18 NOTE — Telephone Encounter (Signed)
Diflucan Prescription sent to pharmacy   

## 2017-07-22 ENCOUNTER — Telehealth: Payer: Self-pay | Admitting: Family Medicine

## 2017-07-22 NOTE — Telephone Encounter (Signed)
Send in a 6-day Sterapred [place] for her

## 2017-07-23 MED ORDER — PREDNISONE 10 MG (21) PO TBPK: ORAL_TABLET | ORAL | 0 refills | Status: DC

## 2017-07-24 ENCOUNTER — Other Ambulatory Visit: Payer: Self-pay | Admitting: Family Medicine

## 2017-08-13 ENCOUNTER — Other Ambulatory Visit: Payer: Self-pay | Admitting: *Deleted

## 2017-08-13 DIAGNOSIS — J302 Other seasonal allergic rhinitis: Secondary | ICD-10-CM

## 2017-08-13 MED ORDER — FLUTICASONE PROPIONATE 50 MCG/ACT NA SUSP: NASAL | 0 refills | Status: DC

## 2017-09-13 ENCOUNTER — Ambulatory Visit: Payer: BLUE CROSS/BLUE SHIELD | Admitting: Family Medicine

## 2017-09-13 VITALS — BP 199/102 | HR 78 | Temp 100.0°F | Ht 63.0 in | Wt 205.0 lb

## 2017-09-13 DIAGNOSIS — R030 Elevated blood-pressure reading, without diagnosis of hypertension: Secondary | ICD-10-CM

## 2017-09-13 DIAGNOSIS — J40 Bronchitis, not specified as acute or chronic: Secondary | ICD-10-CM

## 2017-09-13 MED ORDER — AZITHROMYCIN 250 MG PO TABS: ORAL_TABLET | ORAL | 0 refills | Status: DC

## 2017-09-13 MED ORDER — GUAIFENESIN-CODEINE 100-10 MG/5ML PO SOLN: 5.0000 mL | Freq: Four times a day (QID) | ORAL | 0 refills | Status: DC | PRN

## 2017-09-13 NOTE — Progress Notes (Signed)
Subjective: CC: cough PCP: Claretta Fraise, MD [id number] [place] is a 52 y.o. female presenting to clinic today for:  1. Cough Patient reports productive cough, associated shortness of breath and chest congestion that started Monday.  She notes fever to 102 F Monday through Wednesday.  Denies hemoptysis, sinus pressure, headache, dizziness, rash, nausea, vomiting, diarrhea, chills, myalgia, sick contacts, recent travel.  Patient has used NyQuil and Alka-Seltzer plus with little relief of symptoms.  No history of COPD or asthma.  No tobacco use/ exposure.  ROS: Per HPI  Allergies  Allergen Reactions  . Pollen Extract    Past Medical History:  Diagnosis Date  . Hyperlipidemia   . Hypertension   . Thyroid disease    thyroidectomy due to cancer    Current Outpatient Medications:  .  cloNIDine (CATAPRES) 0.1 MG tablet, Take 1 tablet (0.1 mg total) by mouth 2 (two) times daily., Disp: 180 tablet, Rfl: 5 .  fluticasone (FLONASE) 50 MCG/ACT nasal spray, SPRAY 2 SPRAYS INTO EACH NOSTRIL EVERY DAY, Disp: 48 g, Rfl: 0 .  levothyroxine (SYNTHROID, LEVOTHROID) 200 MCG tablet, TAKE 1 TABLET (200 MCG TOTAL) BY MOUTH DAILY., Disp: 90 tablet, Rfl: 2 .  lisinopril (PRINIVIL,ZESTRIL) 10 MG tablet, TAKE 1 TABLET (10 MG TOTAL) BY MOUTH DAILY., Disp: 90 tablet, Rfl: 0 Social History   Socioeconomic History  . Marital status: Married    Spouse name: Not on file  . Number of children: Not on file  . Years of education: Not on file  . Highest education level: Not on file  Social Needs  . Financial resource strain: Not on file  . Food insecurity - worry: Not on file  . Food insecurity - inability: Not on file  . Transportation needs - medical: Not on file  . Transportation needs - non-medical: Not on file  Occupational History  . Not on file  Tobacco Use  . Smoking status: Never Smoker  . Smokeless tobacco: Never Used  Substance and Sexual Activity  . Alcohol use: No  . Drug use: No  .  Sexual activity: Not on file  Other Topics Concern  . Not on file  Social History Narrative  . Not on file   Family History  Problem Relation Age of Onset  . Hypertension Mother   . Dementia Mother   . Macular degeneration Mother   . Stroke Mother   . COPD Father        lung cancer  . Cancer Brother        colon cancer  . Cancer Brother        brain tumor    Objective: Office vital signs reviewed. BP (!) 198/91   Pulse 78   Temp 100 F (37.8 C)   Ht 5\' 3"  (1.6 m)   Wt 205 lb (93 kg)   LMP 09/07/2016 (Exact Date)   SpO2 99%   BMI 36.31 kg/m   Physical Examination:  General: Awake, alert, obese, No acute distress HEENT: Normal    Neck: No masses palpated. No lymphadenopathy    Ears: Tympanic membranes intact, normal light reflex, no erythema, no bulging    Eyes: PERRLA, extraocular membranes intact, sclera white, no ocular discharge    Nose: nasal turbinates moist, clear nasal discharge    Throat: moist mucus membranes, moderate oropharyngeal erythema, no tonsillar exudate.  Airway is patent Cardio: regular rate and rhythm, S1S2 heard, no murmurs appreciated Pulm: clear to auscultation bilaterally, no wheezes, rhonchi or rales; normal work of  breathing on room air  Assessment/ Plan: 52 y.o. female   1. Bronchitis Given constellation of symptoms and persistent fever, will treat with oral antibiotics.  Oral decongestant with cough suppressant also prescribed.  Caution sedation as this does contain codeine.  Home care instructions were reviewed with the patient and a handout was provided.  I did recommend that she avoid over-the-counter cough and cold remedies that are not intended for persons with high blood pressure, as her blood pressure is significantly elevated today.  She voiced good understanding.  She will follow-up if needed.  2. Elevated blood pressure reading She is asymptomatic.  She is compliant with her medications.  Her blood pressure may be elevated  secondary to OTC cough and cold medication use versus persistent coughing in room.  Return precautions were reviewed with the patient.  She voiced good understanding.  She will follow-up in 2 weeks for repeat blood pressure measurements.   Meds ordered this encounter  Medications  . guaiFENesin-codeine 100-10 MG/5ML syrup    Sig: Take 5 mLs by mouth every 6 (six) hours as needed for cough.    Dispense:  120 mL    Refill:  0  . azithromycin (ZITHROMAX Z-PAK) 250 MG tablet    Sig: As directed    Dispense:  6 tablet    Refill:  0   The Narcotic Database has been reviewed.  There were no red flags.      Janora Norlander, DO [place] [phone]

## 2017-09-13 NOTE — Patient Instructions (Addendum)
I value your feedback and appreciate you entrusting [place] with your care.  If you get a survey, I would appreciate your taking the time to let us know what your experience was like.  Please follow-up in about 2 weeks to ensure that your blood pressure has returned to normal.  You have been prescribed an antibiotic to take for the next 5 days.  You have also been prescribed a cough suppressant containing codeine.  Please do not operate any heavy machinery while on this medication as it can cause sedation and impaired your ability to drive.  I recommend that you only use cold medications that are safe in high blood pressure like Coricidin (generic is fine).  Other cold medications can increase your blood pressure.    - Get plenty of rest and drink plenty of fluids. - Try to breathe moist air. Use a cold mist humidifier. - Consume warm fluids (soup or tea) to provide relief for a stuffy nose and to loosen phlegm. - For nasal stuffiness, try saline nasal spray or a Neti Pot.  Afrin nasal spray can also be used but this product should not be used longer than 3 days or it will cause rebound nasal stuffiness (worsening nasal congestion). - For sore throat pain relief: suck on throat lozenges, hard candy or popsicles; gargle with warm salt water (1/4 tsp. salt per 8 oz. of water); and eat soft, bland foods. - Eat a well-balanced diet. If you cannot, ensure you are getting enough nutrients by taking a daily multivitamin. - Avoid dairy products, as they can thicken phlegm. - Avoid alcohol, as it impairs your body's immune system.  CONTACT YOUR DOCTOR IF YOU EXPERIENCE ANY OF THE FOLLOWING: - High fever - Ear pain - Sinus-type headache - Unusually severe cold symptoms - Cough that gets worse while other cold symptoms improve - Flare up of any chronic lung problem, such as asthma - Your symptoms persist longer than 2 weeks

## 2017-10-21 ENCOUNTER — Other Ambulatory Visit: Payer: Self-pay | Admitting: *Deleted

## 2017-10-21 MED ORDER — LEVOTHYROXINE SODIUM 200 MCG PO TABS: ORAL_TABLET | ORAL | 0 refills | Status: DC

## 2017-10-21 MED ORDER — LISINOPRIL 10 MG PO TABS: ORAL_TABLET | ORAL | 0 refills | Status: DC

## 2017-10-21 NOTE — Addendum Note (Signed)
Addended by: Antonietta Barcelona D on: 10/21/2017 11:55 AM   Modules accepted: Orders

## 2017-10-22 ENCOUNTER — Other Ambulatory Visit: Payer: Self-pay | Admitting: Family Medicine

## 2017-10-22 NOTE — Telephone Encounter (Signed)
Authorize 30 days only. Then contact the patient letting them know that they will need an appointment before any further prescriptions can be sent in. 

## 2017-10-22 NOTE — Telephone Encounter (Signed)
Last thyroid 10/23/16

## 2017-11-07 ENCOUNTER — Other Ambulatory Visit: Payer: Self-pay | Admitting: *Deleted

## 2017-11-07 DIAGNOSIS — J302 Other seasonal allergic rhinitis: Secondary | ICD-10-CM

## 2017-11-07 MED ORDER — FLUTICASONE PROPIONATE 50 MCG/ACT NA SUSP: NASAL | 0 refills | Status: DC

## 2017-11-22 ENCOUNTER — Other Ambulatory Visit: Payer: Self-pay | Admitting: *Deleted

## 2017-11-22 MED ORDER — LISINOPRIL 10 MG PO TABS: ORAL_TABLET | ORAL | 4 refills | Status: DC

## 2017-11-27 ENCOUNTER — Other Ambulatory Visit: Payer: Self-pay | Admitting: Family Medicine

## 2017-11-27 MED ORDER — LISINOPRIL 10 MG PO TABS: ORAL_TABLET | ORAL | 0 refills | Status: DC

## 2017-11-27 NOTE — Telephone Encounter (Signed)
Refill sent to pharmacy for 30 days.  Patient has an appt on 12/09/17 aware to keep appt.

## 2017-12-09 ENCOUNTER — Ambulatory Visit: Payer: BLUE CROSS/BLUE SHIELD | Admitting: Family Medicine

## 2017-12-09 ENCOUNTER — Encounter: Payer: Self-pay | Admitting: Family Medicine

## 2017-12-09 VITALS — BP 154/84 | HR 74 | Temp 97.5°F | Ht 63.0 in | Wt 201.2 lb

## 2017-12-09 DIAGNOSIS — E039 Hypothyroidism, unspecified: Secondary | ICD-10-CM

## 2017-12-09 MED ORDER — LISINOPRIL 20 MG PO TABS: 20.0000 mg | ORAL_TABLET | Freq: Every day | ORAL | 1 refills | Status: DC

## 2017-12-09 NOTE — Progress Notes (Signed)
Subjective:  Patient ID: Ann Peterson, female    DOB: [birth year]  Age: 52 y.o. MRN: [id number]  CC: Hypothyroidism   HPI Ann Peterson presents for recheck of her thyroid.  She notes that she is having a fine tremor in her hands.  This has not happened since she was first diagnosed with her thyroid condition.  It seems to be getting worse over the last couple of months.  Additionally she is having hair coming out in clumps when she brushes it daily.  Her eyebrows are thinning also.  She is noted that she is constipated and she is having to take MiraLAX on a regular basis.  She went through hysterectomy last year.  It was a partial, she still has her ovaries.  Since that time she has been gaining quite a bit of weight.  She lost 40 pounds on weight watchers prior to that time now she has gained it back and she is over 200 pounds and says she has never weighed that much in her life.  She stays exhausted all the time.  She has to come home after work and take a nap in the evening.  Her skin is very dry and she is having to use cortisone cream on it.  She is tried various lotions without any success.  Patient also in for blood pressure follow-up. She continues meds clonidine and lisinopril.  Not checking at home blood pressure is rather high.  She has been taking clonidine for quite some time going back to long before the fatigue started.  Depression screen [facility] 2/9 07/18/2017 01/19/2017 10/23/2016  Decreased Interest 0 0 0  Down, Depressed, Hopeless 0 0 0  PHQ - 2 Score 0 0 0    History Ann Peterson has a past medical history of Hyperlipidemia, Hypertension, and Thyroid disease.   She has a past surgical history that includes Total thyroidectomy and Abdominal hysterectomy (09/12/2016).   Her family history includes COPD in her father; Cancer in her brother and brother; Dementia in her mother; Hypertension in her mother; Macular degeneration in her mother; Stroke in her mother.She reports that she has never  smoked. She has never used smokeless tobacco. She reports that she does not drink alcohol or use drugs.    ROS Review of Systems  Constitutional: Positive for activity change, fatigue and unexpected weight change.  HENT: Negative.  Negative for congestion.   Eyes: Negative.  Negative for visual disturbance.  Respiratory: Negative.  Negative for shortness of breath.   Cardiovascular: Negative for chest pain and palpitations.  Gastrointestinal: Positive for constipation. Negative for abdominal pain, diarrhea, nausea and vomiting.  Genitourinary: Negative for difficulty urinating.  Musculoskeletal: Negative for arthralgias and myalgias.  Skin:       Patient notes that the skin is very dry and itchy.  See HPI.  Neurological: Negative for headaches.  Psychiatric/Behavioral: Negative for dysphoric mood and sleep disturbance. The patient is not nervous/anxious.     Objective:  BP (!) 154/84   Pulse 74   Temp (!) 97.5 F (36.4 C) (Oral)   Ht 5' 3"  (1.6 m)   Wt 201 lb 4 oz (91.3 kg)   LMP 09/07/2016 (Exact Date)   BMI 35.65 kg/m   BP Readings from Last 3 Encounters:  12/09/17 (!) 154/84  09/13/17 (!) 199/102  07/18/17 (!) 159/86    Wt Readings from Last 3 Encounters:  12/09/17 201 lb 4 oz (91.3 kg)  09/13/17 205 lb (93 kg)  07/18/17 199 lb 3.2  oz (90.4 kg)     Physical Exam  Constitutional: She is oriented to person, place, and time. She appears well-developed and well-nourished. No distress.  HENT:  Head: Normocephalic and atraumatic.  Right Ear: External ear normal.  Left Ear: External ear normal.  Neck: Normal range of motion. Neck supple.  Cardiovascular: Normal rate, regular rhythm and normal heart sounds.  No murmur heard. Pulmonary/Chest: Effort normal and breath sounds normal. No respiratory distress. She has no wheezes. She has no rales.  Abdominal: Soft. There is no tenderness.  Musculoskeletal: Normal range of motion. She exhibits no edema or tenderness.    Neurological: She is alert and oriented to person, place, and time. She exhibits normal muscle tone. Coordination normal.  Skin: Skin is warm and dry.  Psychiatric: She has a normal mood and affect. Her behavior is normal.      Assessment & Plan:   Ann Peterson was seen today for hypothyroidism.  Diagnoses and all orders for this visit:  Hypothyroidism, unspecified type -     CBC with Differential/Platelet -     CMP14+EGFR -     TSH -     T4, Free  Other orders -     lisinopril (PRINIVIL,ZESTRIL) 20 MG tablet; Take 1 tablet (20 mg total) by mouth daily.       I have discontinued Orlando Penner Neyhart's guaiFENesin-codeine, azithromycin, and lisinopril. I am also having her start on lisinopril. Additionally, I am having her maintain her cloNIDine, levothyroxine, and fluticasone.  Allergies as of 12/09/2017      Reactions   Pollen Extract       Medication List        Accurate as of 12/09/17  7:19 PM. Always use your most recent med list.          cloNIDine 0.1 MG tablet Commonly known as:  CATAPRES Take 1 tablet (0.1 mg total) by mouth 2 (two) times daily.   fluticasone 50 MCG/ACT nasal spray Commonly known as:  FLONASE SPRAY 2 SPRAYS INTO EACH NOSTRIL EVERY DAY   levothyroxine 200 MCG tablet Commonly known as:  SYNTHROID, LEVOTHROID TAKE 1 TABLET (200 MCG TOTAL) BY MOUTH DAILY.   lisinopril 20 MG tablet Commonly known as:  PRINIVIL,ZESTRIL Take 1 tablet (20 mg total) by mouth daily.        Follow-up: No follow-ups on file.  Claretta Fraise, M.D.

## 2017-12-10 LAB — CMP14+EGFR
ALT: 22 IU/L (ref 0–32)
AST: 19 IU/L (ref 0–40)
Albumin/Globulin Ratio: 1.9 (ref 1.2–2.2)
Albumin: 4.2 g/dL (ref 3.5–5.5)
Alkaline Phosphatase: 77 IU/L (ref 39–117)
BUN/Creatinine Ratio: 16 (ref 9–23)
BUN: 12 mg/dL (ref 6–24)
Bilirubin Total: 0.2 mg/dL (ref 0.0–1.2)
CALCIUM: 9.1 mg/dL (ref 8.7–10.2)
CO2: 22 mmol/L (ref 20–29)
CREATININE: 0.75 mg/dL (ref 0.57–1.00)
Chloride: 100 mmol/L (ref 96–106)
GFR calc Af Amer: 107 mL/min/{1.73_m2} (ref >59)
GFR, EST NON AFRICAN AMERICAN: 93 mL/min/{1.73_m2} (ref >59)
GLOBULIN, TOTAL: 2.2 g/dL (ref 1.5–4.5)
GLUCOSE: 98 mg/dL (ref 65–99)
Potassium: 4.6 mmol/L (ref 3.5–5.2)
SODIUM: 140 mmol/L (ref 134–144)
Total Protein: 6.4 g/dL (ref 6.0–8.5)

## 2017-12-10 LAB — CBC WITH DIFFERENTIAL/PLATELET
Basophils Absolute: 0 10*3/uL (ref 0.0–0.2)
Basos: 0 %
EOS (ABSOLUTE): 0.1 10*3/uL (ref 0.0–0.4)
EOS: 2 %
HEMATOCRIT: 40.1 % (ref 34.0–46.6)
Hemoglobin: 13.6 g/dL (ref 11.1–15.9)
IMMATURE GRANULOCYTES: 0 %
Immature Grans (Abs): 0 10*3/uL (ref 0.0–0.1)
Lymphocytes Absolute: 2 10*3/uL (ref 0.7–3.1)
Lymphs: 26 %
MCH: 29.8 pg (ref 26.6–33.0)
MCHC: 33.9 g/dL (ref 31.5–35.7)
MCV: 88 fL (ref 79–97)
MONOCYTES: 7 %
MONOS ABS: 0.5 10*3/uL (ref 0.1–0.9)
NEUTROS PCT: 65 %
Neutrophils Absolute: 4.9 10*3/uL (ref 1.4–7.0)
Platelets: 271 10*3/uL (ref 150–379)
RBC: 4.57 x10E6/uL (ref 3.77–5.28)
RDW: 13.4 % (ref 12.3–15.4)
WBC: 7.6 10*3/uL (ref 3.4–10.8)

## 2017-12-10 LAB — TSH: TSH: 0.191 u[IU]/mL — ABNORMAL LOW (ref 0.450–4.500)

## 2017-12-10 LAB — T4, FREE: FREE T4: 1.72 ng/dL (ref 0.82–1.77)

## 2017-12-16 ENCOUNTER — Telehealth: Payer: Self-pay | Admitting: Family Medicine

## 2017-12-16 MED ORDER — LEVOTHYROXINE SODIUM 200 MCG PO TABS: ORAL_TABLET | ORAL | 2 refills | Status: DC

## 2017-12-16 MED ORDER — CLONIDINE HCL 0.1 MG PO TABS: 0.1000 mg | ORAL_TABLET | Freq: Two times a day (BID) | ORAL | 5 refills | Status: DC

## 2017-12-16 NOTE — Telephone Encounter (Signed)
Aware of results and copy mailed to patient

## 2018-01-20 ENCOUNTER — Other Ambulatory Visit: Payer: Self-pay | Admitting: Family Medicine

## 2018-02-11 ENCOUNTER — Ambulatory Visit: Payer: BLUE CROSS/BLUE SHIELD | Admitting: Family Medicine

## 2018-02-11 ENCOUNTER — Encounter: Payer: Self-pay | Admitting: Family Medicine

## 2018-02-11 VITALS — BP 169/80 | HR 70 | Temp 98.4°F | Ht 63.0 in | Wt 200.2 lb

## 2018-02-11 DIAGNOSIS — M542 Cervicalgia: Secondary | ICD-10-CM

## 2018-02-11 DIAGNOSIS — E039 Hypothyroidism, unspecified: Secondary | ICD-10-CM | POA: Diagnosis not present

## 2018-02-11 MED ORDER — CYCLOBENZAPRINE HCL 10 MG PO TABS: 10.0000 mg | ORAL_TABLET | Freq: Three times a day (TID) | ORAL | 0 refills | Status: DC | PRN

## 2018-02-11 MED ORDER — METOPROLOL SUCCINATE ER 100 MG PO TB24: 100.0000 mg | ORAL_TABLET | Freq: Every day | ORAL | 3 refills | Status: DC

## 2018-02-11 MED ORDER — PREDNISONE 10 MG PO TABS: ORAL_TABLET | ORAL | 0 refills | Status: DC

## 2018-02-11 NOTE — Patient Instructions (Addendum)
DASH Eating Plan DASH stands for "Dietary Approaches to Stop Hypertension." The DASH eating plan is a healthy eating plan that has been shown to reduce high blood pressure (hypertension). It may also reduce your risk for type 2 diabetes, heart disease, and stroke. The DASH eating plan may also help with weight loss. What are tips for following this plan? General guidelines  Avoid eating more than 2,300 mg (milligrams) of salt (sodium) a day. If you have hypertension, you may need to reduce your sodium intake to 1,500 mg a day.  Limit alcohol intake to no more than 1 drink a day for nonpregnant women and 2 drinks a day for men. One drink equals 12 oz of beer, 5 oz of wine, or 1 oz of hard liquor.  Work with your health care provider to maintain a healthy body weight or to lose weight. Ask what an ideal weight is for you.  Get at least 30 minutes of exercise that causes your heart to beat faster (aerobic exercise) most days of the week. Activities may include walking, swimming, or biking.  Work with your health care provider or diet and nutrition specialist (dietitian) to adjust your eating plan to your individual calorie needs. Reading food labels  Check food labels for the amount of sodium per serving. Choose foods with less than 5 percent of the Daily Value of sodium. Generally, foods with less than 300 mg of sodium per serving fit into this eating plan.  To find whole grains, look for the word "whole" as the first word in the ingredient list. Shopping  Buy products labeled as "low-sodium" or "no salt added."  Buy fresh foods. Avoid canned foods and premade or frozen meals. Cooking  Avoid adding salt when cooking. Use salt-free seasonings or herbs instead of table salt or sea salt. Check with your health care provider or pharmacist before using salt substitutes.  Do not fry foods. Cook foods using healthy methods such as baking, boiling, grilling, and broiling instead.  Cook with  heart-healthy oils, such as olive, canola, soybean, or sunflower oil. Meal planning   Eat a balanced diet that includes: ? 5 or more servings of fruits and vegetables each day. At each meal, try to fill half of your plate with fruits and vegetables. ? Up to 6-8 servings of whole grains each day. ? Less than 6 oz of lean meat, poultry, or fish each day. A 3-oz serving of meat is about the same size as a deck of cards. One egg equals 1 oz. ? 2 servings of low-fat dairy each day. ? A serving of nuts, seeds, or beans 5 times each week. ? Heart-healthy fats. Healthy fats called Omega-3 fatty acids are found in foods such as flaxseeds and coldwater fish, like sardines, salmon, and mackerel.  Limit how much you eat of the following: ? Canned or prepackaged foods. ? Food that is high in trans fat, such as fried foods. ? Food that is high in saturated fat, such as fatty meat. ? Sweets, desserts, sugary drinks, and other foods with added sugar. ? Full-fat dairy products.  Do not salt foods before eating.  Try to eat at least 2 vegetarian meals each week.  Eat more home-cooked food and less restaurant, buffet, and fast food.  When eating at a restaurant, ask that your food be prepared with less salt or no salt, if possible. What foods are recommended? The items listed may not be a complete list. Talk with your dietitian about what   dietary choices are best for you. Grains Whole-grain or whole-wheat bread. Whole-grain or whole-wheat pasta. Brown rice. Oatmeal. Quinoa. Bulgur. Whole-grain and low-sodium cereals. Pita bread. Low-fat, low-sodium crackers. Whole-wheat flour tortillas. Vegetables Fresh or frozen vegetables (raw, steamed, roasted, or grilled). Low-sodium or reduced-sodium tomato and vegetable juice. Low-sodium or reduced-sodium tomato sauce and tomato paste. Low-sodium or reduced-sodium canned vegetables. Fruits All fresh, dried, or frozen fruit. Canned fruit in natural juice (without  added sugar). Meat and other protein foods Skinless chicken or turkey. Ground chicken or turkey. Pork with fat trimmed off. Fish and seafood. Egg whites. Dried beans, peas, or lentils. Unsalted nuts, nut butters, and seeds. Unsalted canned beans. Lean cuts of beef with fat trimmed off. Low-sodium, lean deli meat. Dairy Low-fat (1%) or fat-free (skim) milk. Fat-free, low-fat, or reduced-fat cheeses. Nonfat, low-sodium ricotta or cottage cheese. Low-fat or nonfat yogurt. Low-fat, low-sodium cheese. Fats and oils Soft margarine without trans fats. Vegetable oil. Low-fat, reduced-fat, or light mayonnaise and salad dressings (reduced-sodium). Canola, safflower, olive, soybean, and sunflower oils. Avocado. Seasoning and other foods Herbs. Spices. Seasoning mixes without salt. Unsalted popcorn and pretzels. Fat-free sweets. What foods are not recommended? The items listed may not be a complete list. Talk with your dietitian about what dietary choices are best for you. Grains Baked goods made with fat, such as croissants, muffins, or some breads. Dry pasta or rice meal packs. Vegetables Creamed or fried vegetables. Vegetables in a cheese sauce. Regular canned vegetables (not low-sodium or reduced-sodium). Regular canned tomato sauce and paste (not low-sodium or reduced-sodium). Regular tomato and vegetable juice (not low-sodium or reduced-sodium). Pickles. Olives. Fruits Canned fruit in a light or heavy syrup. Fried fruit. Fruit in cream or butter sauce. Meat and other protein foods Fatty cuts of meat. Ribs. Fried meat. Bacon. Sausage. Bologna and other processed lunch meats. Salami. Fatback. Hotdogs. Bratwurst. Salted nuts and seeds. Canned beans with added salt. Canned or smoked fish. Whole eggs or egg yolks. Chicken or turkey with skin. Dairy Whole or 2% milk, cream, and half-and-half. Whole or full-fat cream cheese. Whole-fat or sweetened yogurt. Full-fat cheese. Nondairy creamers. Whipped toppings.  Processed cheese and cheese spreads. Fats and oils Butter. Stick margarine. Lard. Shortening. Ghee. Bacon fat. Tropical oils, such as coconut, palm kernel, or palm oil. Seasoning and other foods Salted popcorn and pretzels. Onion salt, garlic salt, seasoned salt, table salt, and sea salt. Worcestershire sauce. Tartar sauce. Barbecue sauce. Teriyaki sauce. Soy sauce, including reduced-sodium. Steak sauce. Canned and packaged gravies. Fish sauce. Oyster sauce. Cocktail sauce. Horseradish that you find on the shelf. Ketchup. Mustard. Meat flavorings and tenderizers. Bouillon cubes. Hot sauce and Tabasco sauce. Premade or packaged marinades. Premade or packaged taco seasonings. Relishes. Regular salad dressings. Where to find more information:  National Heart, Lung, and Blood Institute: www.nhlbi.nih.gov  American Heart Association: www.heart.org Summary  The DASH eating plan is a healthy eating plan that has been shown to reduce high blood pressure (hypertension). It may also reduce your risk for type 2 diabetes, heart disease, and stroke.  With the DASH eating plan, you should limit salt (sodium) intake to 2,300 mg a day. If you have hypertension, you may need to reduce your sodium intake to 1,500 mg a day.  When on the DASH eating plan, aim to eat more fresh fruits and vegetables, whole grains, lean proteins, low-fat dairy, and heart-healthy fats.  Work with your health care provider or diet and nutrition specialist (dietitian) to adjust your eating plan to your individual   calorie needs. This information is not intended to replace advice given to you by your health care provider. Make sure you discuss any questions you have with your health care provider. Document Released: 12/ 02/2011 Document Revised: 08/20/2016 Document Reviewed: 08/20/2016 Elsevier Interactive Patient Education  2018 [employer].      Take Clonidine 1 daily for 1 week then discontinue.  Take Metoprolol 1/2 daily  for 1 week then 1 daily.

## 2018-02-12 LAB — CMP14+EGFR
ALBUMIN: 4.3 g/dL (ref 3.5–5.5)
ALT: 27 IU/L (ref 0–32)
AST: 20 IU/L (ref 0–40)
Albumin/Globulin Ratio: 1.7 (ref 1.2–2.2)
Alkaline Phosphatase: 73 IU/L (ref 39–117)
BUN / CREAT RATIO: 17 (ref 9–23)
BUN: 14 mg/dL (ref 6–24)
Bilirubin Total: 0.3 mg/dL (ref 0.0–1.2)
CHLORIDE: 102 mmol/L (ref 96–106)
CO2: 23 mmol/L (ref 20–29)
CREATININE: 0.82 mg/dL (ref 0.57–1.00)
Calcium: 9.2 mg/dL (ref 8.7–10.2)
GFR calc non Af Amer: 83 mL/min/{1.73_m2} (ref >59)
GFR, EST AFRICAN AMERICAN: 96 mL/min/{1.73_m2} (ref >59)
GLOBULIN, TOTAL: 2.5 g/dL (ref 1.5–4.5)
GLUCOSE: 92 mg/dL (ref 65–99)
Potassium: 4 mmol/L (ref 3.5–5.2)
SODIUM: 140 mmol/L (ref 134–144)
TOTAL PROTEIN: 6.8 g/dL (ref 6.0–8.5)

## 2018-02-12 LAB — TSH: TSH: 0.353 u[IU]/mL — ABNORMAL LOW (ref 0.450–4.500)

## 2018-02-17 ENCOUNTER — Encounter: Payer: Self-pay | Admitting: Family Medicine

## 2018-02-17 NOTE — Progress Notes (Signed)
Subjective:  Patient ID: Ann Peterson, female    DOB: 10/24/1965  Age: 52 y.o. MRN: [id number]  CC: Medical Management of Chronic Issues (pt here today for f/u on her thyroid and also c/o neck pain)   HPI Kjirsten Bloodgood presents for patient presents for follow-up on  thyroid. The patient has a history of hypothyroidism for many years. It has been stable recently. Pt. denies any change in  voice, loss of hair, heat or cold intolerance. Energy level has been adequate to good. Patient denies constipation and diarrhea. No myxedema. Medication is as noted below. Verified that pt is taking it daily on an empty stomach. Well tolerated.  Moderate posterior neck pain for the last several days.  It is  Increasing.  Patient presents for follow-up on  thyroid. The patient has a history of hypothyroidism for many years. It has been stable recently. Pt. denies any change in  voice, loss of hair, heat or cold intolerance. Energy level has been adequate to good. Patient denies constipation and diarrhea. No myxedema. Medication is as noted below. Verified that pt is taking it daily on an empty stomach. Well tolerated.    Depression screen [facility] 2/9 02/11/2018 07/18/2017 01/19/2017  Decreased Interest 0 0 0  Down, Depressed, Hopeless 0 0 0  PHQ - 2 Score 0 0 0    History Keajah has a past medical history of Hyperlipidemia, Hypertension, and Thyroid disease.   She has a past surgical history that includes Total thyroidectomy and Abdominal hysterectomy (09/12/2016).   Her family history includes COPD in her father; Cancer in her brother and brother; Dementia in her mother; Hypertension in her mother; Macular degeneration in her mother; Stroke in her mother.She reports that she has never smoked. She has never used smokeless tobacco. She reports that she does not drink alcohol or use drugs.    ROS Review of Systems  Constitutional: Positive for fatigue (Brought on by the use of clonidine patient would like to  discontinue.).  HENT: Negative for congestion.   Eyes: Negative for visual disturbance.  Respiratory: Negative for shortness of breath.   Cardiovascular: Negative for chest pain.  Gastrointestinal: Negative for abdominal pain, constipation, diarrhea, nausea and vomiting.  Genitourinary: Negative for difficulty urinating.  Musculoskeletal: Positive for myalgias and neck pain. Negative for arthralgias.  Neurological: Negative for headaches.  Psychiatric/Behavioral: Negative for sleep disturbance.    Objective:  BP (!) 169/80   Pulse 70   Temp 98.4 F (36.9 C) (Oral)   Ht 5' 3"  (1.6 m)   Wt 200 lb 4 oz (90.8 kg)   LMP 09/07/2016 (Exact Date)   BMI 35.47 kg/m   BP Readings from Last 3 Encounters:  02/11/18 (!) 169/80  12/09/17 (!) 154/84  09/13/17 (!) 199/102    Wt Readings from Last 3 Encounters:  02/11/18 200 lb 4 oz (90.8 kg)  12/09/17 201 lb 4 oz (91.3 kg)  09/13/17 205 lb (93 kg)     Physical Exam  Constitutional: She is oriented to person, place, and time. She appears well-developed and well-nourished. No distress.  HENT:  Head: Normocephalic and atraumatic.  Eyes: Pupils are equal, round, and reactive to light. Conjunctivae are normal.  Neck: Normal range of motion. Neck supple. No thyromegaly present.  Cardiovascular: Normal rate, regular rhythm and normal heart sounds.  No murmur heard. Pulmonary/Chest: Effort normal and breath sounds normal. No respiratory distress. She has no wheezes. She has no rales.  Abdominal: Soft. Bowel sounds are normal. She exhibits  no distension. There is no tenderness.  Musculoskeletal: Normal range of motion. She exhibits tenderness (posterior neck).  Lymphadenopathy:    She has no cervical adenopathy.  Neurological: She is alert and oriented to person, place, and time.  Skin: Skin is warm and dry.  Psychiatric: She has a normal mood and affect. Her behavior is normal. Judgment and thought content normal.      Assessment &  Plan:   Silvanna was seen today for medical management of chronic issues.  Diagnoses and all orders for this visit:  Hypothyroidism, unspecified type -     CMP14+EGFR -     TSH  Cervicalgia  Other orders -     predniSONE (DELTASONE) 10 MG tablet; Take 5 PO x 2 days, then 4 PO x 2 days, then 3 PO x 2days, then 2 PO x 2 days, then 1 PO x 2 days. -     metoprolol succinate (TOPROL-XL) 100 MG 24 hr tablet; Take 1 tablet (100 mg total) by mouth daily. Take with or immediately following a meal. -     cyclobenzaprine (FLEXERIL) 10 MG tablet; Take 1 tablet (10 mg total) by mouth 3 (three) times daily as needed for muscle spasms.       I am having Ann Peterson start on predniSONE, metoprolol succinate, and cyclobenzaprine. I am also having her maintain her fluticasone, lisinopril, cloNIDine, and levothyroxine.  Allergies as of 02/11/2018      Reactions   Pollen Extract       Medication List        Accurate as of 02/11/18 11:59 PM. Always use your most recent med list.          cloNIDine 0.1 MG tablet Commonly known as:  CATAPRES Take 1 tablet (0.1 mg total) by mouth 2 (two) times daily.   cyclobenzaprine 10 MG tablet Commonly known as:  FLEXERIL Take 1 tablet (10 mg total) by mouth 3 (three) times daily as needed for muscle spasms.   fluticasone 50 MCG/ACT nasal spray Commonly known as:  FLONASE SPRAY 2 SPRAYS INTO EACH NOSTRIL EVERY DAY   levothyroxine 200 MCG tablet Commonly known as:  SYNTHROID, LEVOTHROID TAKE 1 TABLET (200 MCG TOTAL) BY MOUTH DAILY.   lisinopril 20 MG tablet Commonly known as:  PRINIVIL,ZESTRIL Take 1 tablet (20 mg total) by mouth daily.   metoprolol succinate 100 MG 24 hr tablet Commonly known as:  TOPROL-XL Take 1 tablet (100 mg total) by mouth daily. Take with or immediately following a meal.   predniSONE 10 MG tablet Commonly known as:  DELTASONE Take 5 PO x 2 days, then 4 PO x 2 days, then 3 PO x 2days, then 2 PO x 2 days, then 1 PO x 2  days.        Follow-up: No follow-ups on file.  Claretta Fraise, M.D.

## 2018-02-19 LAB — T4, FREE: Free T4: 1.55 ng/dL (ref 0.82–1.77)

## 2018-02-19 LAB — SPECIMEN STATUS REPORT

## 2018-02-19 LAB — T3, FREE: T3, Free: 2.9 pg/mL (ref 2.0–4.4)

## 2018-02-26 DIAGNOSIS — M4802 Spinal stenosis, cervical region: Secondary | ICD-10-CM | POA: Diagnosis not present

## 2018-02-26 DIAGNOSIS — M50221 Other cervical disc displacement at C4-C5 level: Secondary | ICD-10-CM | POA: Diagnosis not present

## 2018-02-26 DIAGNOSIS — M542 Cervicalgia: Secondary | ICD-10-CM | POA: Diagnosis not present

## 2018-03-06 DIAGNOSIS — M5412 Radiculopathy, cervical region: Secondary | ICD-10-CM | POA: Diagnosis not present

## 2018-03-10 DIAGNOSIS — M542 Cervicalgia: Secondary | ICD-10-CM | POA: Diagnosis not present

## 2018-03-10 DIAGNOSIS — R293 Abnormal posture: Secondary | ICD-10-CM | POA: Diagnosis not present

## 2018-03-10 DIAGNOSIS — M546 Pain in thoracic spine: Secondary | ICD-10-CM | POA: Diagnosis not present

## 2018-03-10 DIAGNOSIS — R51 Headache: Secondary | ICD-10-CM | POA: Diagnosis not present

## 2018-03-14 DIAGNOSIS — M546 Pain in thoracic spine: Secondary | ICD-10-CM | POA: Diagnosis not present

## 2018-03-14 DIAGNOSIS — M542 Cervicalgia: Secondary | ICD-10-CM | POA: Diagnosis not present

## 2018-03-14 DIAGNOSIS — R51 Headache: Secondary | ICD-10-CM | POA: Diagnosis not present

## 2018-03-14 DIAGNOSIS — R293 Abnormal posture: Secondary | ICD-10-CM | POA: Diagnosis not present

## 2018-03-20 DIAGNOSIS — M5412 Radiculopathy, cervical region: Secondary | ICD-10-CM | POA: Diagnosis not present

## 2018-03-21 ENCOUNTER — Other Ambulatory Visit: Payer: Self-pay | Admitting: Neurosurgery

## 2018-03-21 DIAGNOSIS — M5412 Radiculopathy, cervical region: Secondary | ICD-10-CM

## 2018-03-25 ENCOUNTER — Ambulatory Visit: Payer: BLUE CROSS/BLUE SHIELD | Admitting: Family Medicine

## 2018-03-29 ENCOUNTER — Other Ambulatory Visit: Payer: Self-pay | Admitting: Family Medicine

## 2018-03-29 DIAGNOSIS — J302 Other seasonal allergic rhinitis: Secondary | ICD-10-CM

## 2018-04-01 ENCOUNTER — Ambulatory Visit
Admission: RE | Admit: 2018-04-01 | Discharge: 2018-04-01 | Disposition: A | Discharge status: Home / Self Care | Payer: BLUE CROSS/BLUE SHIELD | Source: Ambulatory Visit | Attending: Neurosurgery | Admitting: Neurosurgery

## 2018-04-01 DIAGNOSIS — M5412 Radiculopathy, cervical region: Secondary | ICD-10-CM

## 2018-04-01 IMAGING — XA DG INJECT/[PERSON_NAME] INC NEEDLE/CATH/PLC EPI/CERV/THOR W/IMG
2 series · 2 of 2 positions shown · non-contrast
Comparison: none

CLINICAL DATA: Cervical spondylosis without myelopathy. This is
most pronounced at C5-6. Patient also has foraminal narrowing at
C3-4.

[Series 1: ortho standard · 1 of 1 slices shown (1 of 2)]
[im 1/1]
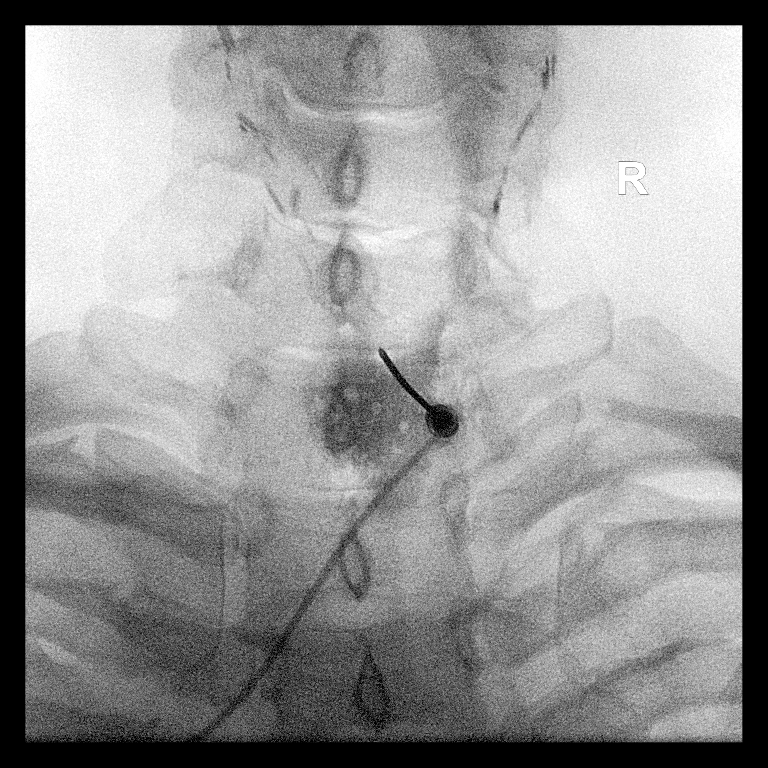

[Series 2: ortho standard · 1 of 1 slices shown (2 of 2)]
[im 1/1]
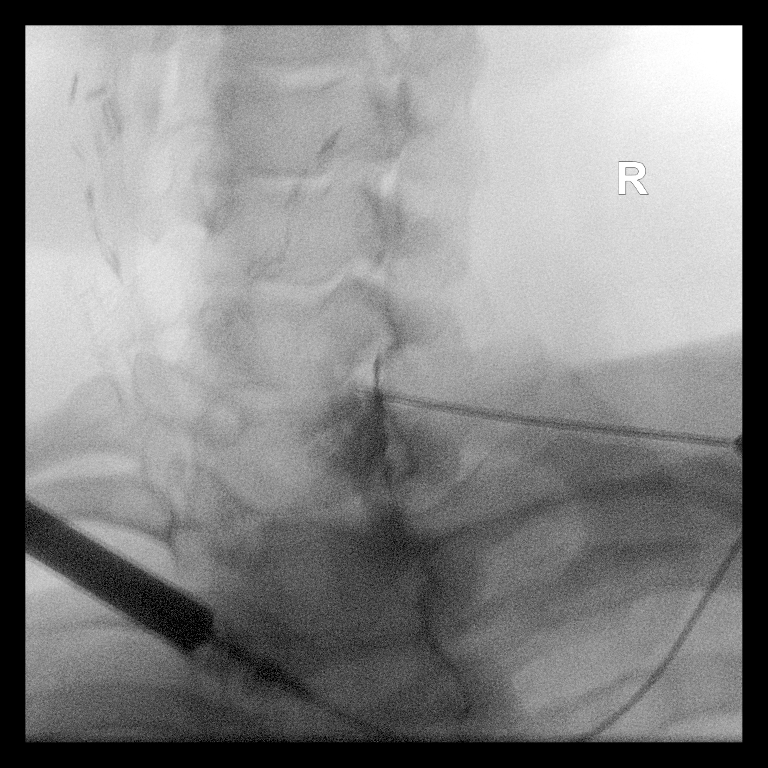

[2 of 2 positions shown; findings below may reference images not displayed]

FLUOROSCOPY TIME:  39 seconds corresponding to a Dose Area Product
of 22.33 Gy*m2

PROCEDURE:
[name] MR was reviewed. The patient has a paucity of epidural
fat throughout the cervical spine, but particularly at the C3-4
level. Transforaminal injection is not deemed to be safe. Non
selective epidural at C7-T1 was performed.

Informed written consent was obtained.  Time-out was performed.

An appropriate skin entry site was chosen, cleansed with Betadine,
and anesthetized with 1% lidocaine.

CERVICAL EPIDURAL INJECTION

An interlaminar approach was performed on the RIGHT at C7-T1. A 20
gauge epidural needle was advanced using loss-of-resistance
technique.

DIAGNOSTIC EPIDURAL INJECTION

Injection of Isovue-M 300 shows a good epidural pattern with spread
above and below the level of needle placement, primarily on the
RIGHT. No vascular opacification is seen.

THERAPEUTIC EPIDURAL INJECTION

1.5 ml of Kenalog 40 mixed with 1 ml of 1% Lidocaine and 2 ml of
normal saline were then instilled. The procedure was well-tolerated,
and the patient was discharged thirty minutes following the
injection in good condition.
IMPRESSION: Technically successful first epidural injection on the RIGHT at
C7-T1.

## 2018-04-01 MED ORDER — IOPAMIDOL (ISOVUE-M 300) INJECTION 61%
1.0000 mL | Freq: Once | INTRAMUSCULAR | Status: AC | PRN
  Administered 2018-04-01: 1 mL via EPIDURAL

## 2018-04-01 MED ORDER — TRIAMCINOLONE ACETONIDE 40 MG/ML IJ SUSP (RADIOLOGY)
60.0000 mg | Freq: Once | INTRAMUSCULAR | Status: AC
  Administered 2018-04-01: 60 mg via EPIDURAL

## 2018-04-01 NOTE — Discharge Instructions (Signed)

## 2018-04-23 DIAGNOSIS — M5412 Radiculopathy, cervical region: Secondary | ICD-10-CM | POA: Diagnosis not present

## 2018-04-23 DIAGNOSIS — Z6835 Body mass index (BMI) 35.0-35.9, adult: Secondary | ICD-10-CM | POA: Diagnosis not present

## 2018-04-23 DIAGNOSIS — I10 Essential (primary) hypertension: Secondary | ICD-10-CM | POA: Diagnosis not present

## 2018-05-27 ENCOUNTER — Other Ambulatory Visit: Payer: Self-pay | Admitting: Family Medicine

## 2018-06-22 ENCOUNTER — Other Ambulatory Visit: Payer: Self-pay | Admitting: Family Medicine

## 2018-07-01 DIAGNOSIS — S335XXA Sprain of ligaments of lumbar spine, initial encounter: Secondary | ICD-10-CM | POA: Diagnosis not present

## 2018-07-01 DIAGNOSIS — I10 Essential (primary) hypertension: Secondary | ICD-10-CM | POA: Diagnosis not present

## 2018-07-03 ENCOUNTER — Emergency Department (HOSPITAL_BASED_OUTPATIENT_CLINIC_OR_DEPARTMENT_OTHER): Payer: BLUE CROSS/BLUE SHIELD

## 2018-07-03 ENCOUNTER — Other Ambulatory Visit: Payer: Self-pay

## 2018-07-03 ENCOUNTER — Emergency Department (HOSPITAL_BASED_OUTPATIENT_CLINIC_OR_DEPARTMENT_OTHER)
Admission: EM | Admit: 2018-07-03 | Discharge: 2018-07-03 | Disposition: A | Discharge status: Home / Self Care | Payer: BLUE CROSS/BLUE SHIELD | Attending: Emergency Medicine | Admitting: Emergency Medicine

## 2018-07-03 ENCOUNTER — Encounter (HOSPITAL_BASED_OUTPATIENT_CLINIC_OR_DEPARTMENT_OTHER): Payer: Self-pay

## 2018-07-03 DIAGNOSIS — Z79899 Other long term (current) drug therapy: Secondary | ICD-10-CM | POA: Insufficient documentation

## 2018-07-03 DIAGNOSIS — I10 Essential (primary) hypertension: Secondary | ICD-10-CM | POA: Diagnosis not present

## 2018-07-03 DIAGNOSIS — E039 Hypothyroidism, unspecified: Secondary | ICD-10-CM | POA: Diagnosis not present

## 2018-07-03 DIAGNOSIS — N200 Calculus of kidney: Secondary | ICD-10-CM | POA: Diagnosis not present

## 2018-07-03 DIAGNOSIS — R109 Unspecified abdominal pain: Secondary | ICD-10-CM | POA: Insufficient documentation

## 2018-07-03 DIAGNOSIS — R1032 Left lower quadrant pain: Secondary | ICD-10-CM | POA: Diagnosis not present

## 2018-07-03 LAB — BASIC METABOLIC PANEL
ANION GAP: 10 (ref 5–15)
BUN: 20 mg/dL (ref 6–20)
CHLORIDE: 101 mmol/L (ref 98–111)
CO2: 27 mmol/L (ref 22–32)
CREATININE: 0.85 mg/dL (ref 0.44–1.00)
Calcium: 8.8 mg/dL — ABNORMAL LOW (ref 8.9–10.3)
GFR calc non Af Amer: >60 mL/min (ref >60)
GLUCOSE: 107 mg/dL — AB (ref 70–99)
Potassium: 3.9 mmol/L (ref 3.5–5.1)
SODIUM: 138 mmol/L (ref 135–145)

## 2018-07-03 LAB — URINALYSIS, ROUTINE W REFLEX MICROSCOPIC
BILIRUBIN URINE: NEGATIVE
Glucose, UA: NEGATIVE mg/dL
KETONES UR: NEGATIVE mg/dL
Leukocytes, UA: NEGATIVE
Nitrite: NEGATIVE
Protein, ur: NEGATIVE mg/dL
Specific Gravity, Urine: 1.015 (ref 1.005–1.030)
pH: 7.5 (ref 5.0–8.0)

## 2018-07-03 LAB — CBC WITH DIFFERENTIAL/PLATELET
ABS IMMATURE GRANULOCYTES: 0.03 10*3/uL (ref 0.00–0.07)
BASOS PCT: 1 %
Basophils Absolute: 0 10*3/uL (ref 0.0–0.1)
Eosinophils Absolute: 0.1 10*3/uL (ref 0.0–0.5)
Eosinophils Relative: 2 %
HCT: 42.4 % (ref 36.0–46.0)
Hemoglobin: 13.9 g/dL (ref 12.0–15.0)
Immature Granulocytes: 1 %
LYMPHS PCT: 29 %
Lymphs Abs: 1.8 10*3/uL (ref 0.7–4.0)
MCH: 30 pg (ref 26.0–34.0)
MCHC: 32.8 g/dL (ref 30.0–36.0)
MCV: 91.6 fL (ref 80.0–100.0)
Monocytes Absolute: 0.6 10*3/uL (ref 0.1–1.0)
Monocytes Relative: 9 %
NEUTROS ABS: 3.7 10*3/uL (ref 1.7–7.7)
NEUTROS PCT: 58 %
Platelets: 216 10*3/uL (ref 150–400)
RBC: 4.63 MIL/uL (ref 3.87–5.11)
RDW: 12 % (ref 11.5–15.5)
WBC: 6.3 10*3/uL (ref 4.0–10.5)
nRBC: 0 % (ref 0.0–0.2)

## 2018-07-03 LAB — URINALYSIS, MICROSCOPIC (REFLEX)

## 2018-07-03 IMAGING — CT CT RENAL STONE PROTOCOL
2 of 4 series · 16 of 46 positions shown, 18 images · non-contrast
Comparison: [DATE]

CLINICAL DATA: Lower back pain for 4 days. Left-sided abdominal
pain for 2 days.

EXAM:
CT ABDOMEN AND PELVIS WITHOUT CONTRAST
TECHNIQUE: Multidetector CT imaging of the abdomen and pelvis was performed
following the standard protocol without IV contrast.

[Series 2: axial st · axial · 0.91mm/px · z∈[-533,-148]mm · 13 of 85 slices shown, 15 images]
[im 4/85  soft-tissue]
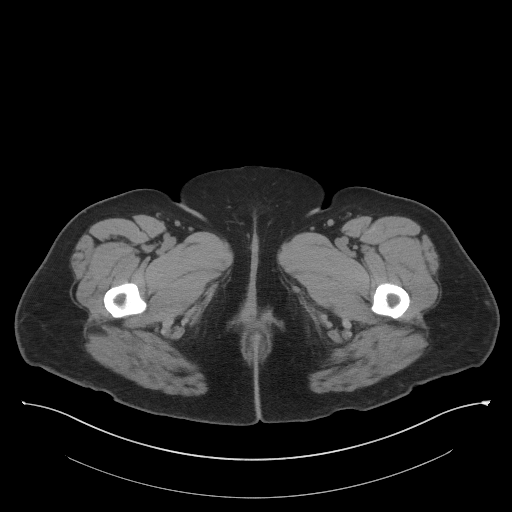
[im 4/85  bone]
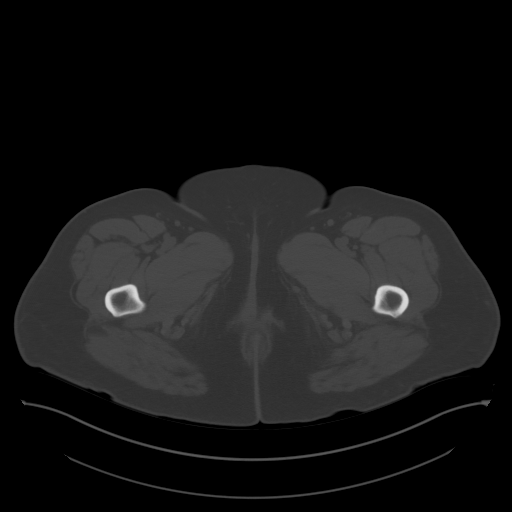
[im 11/85  soft-tissue]
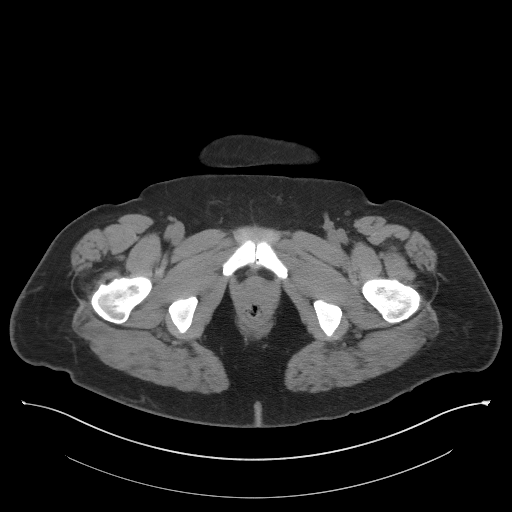
[im 17/85  soft-tissue]
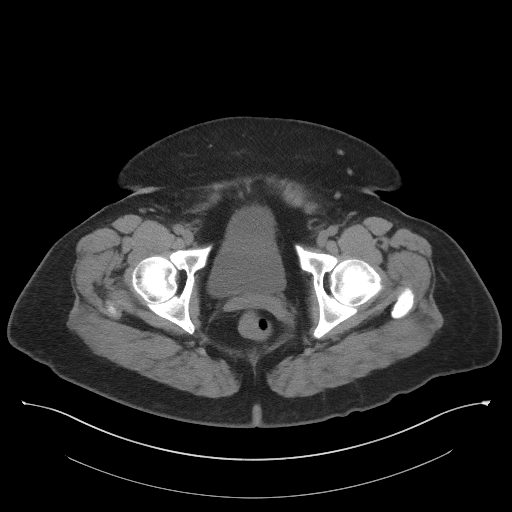
[im 24/85  soft-tissue]
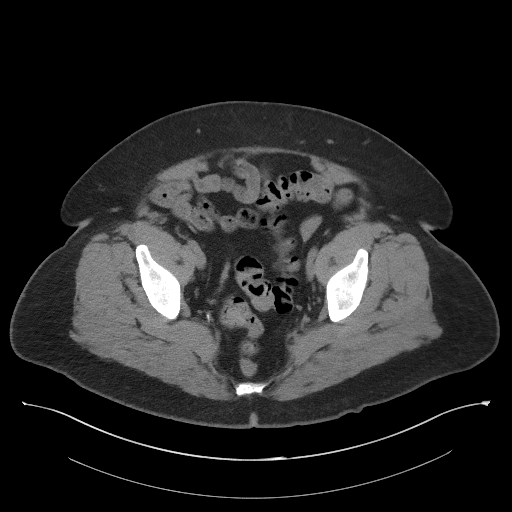
[im 31/85  soft-tissue]
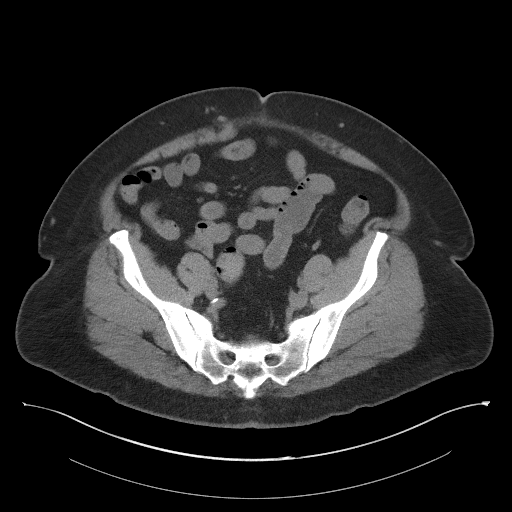
[im 37/85  soft-tissue]
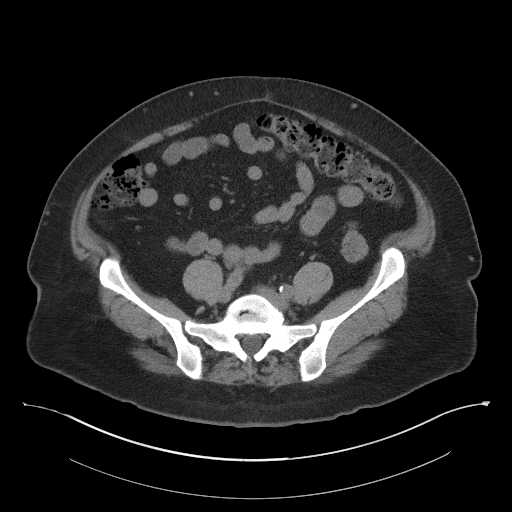
[im 44/85  soft-tissue]
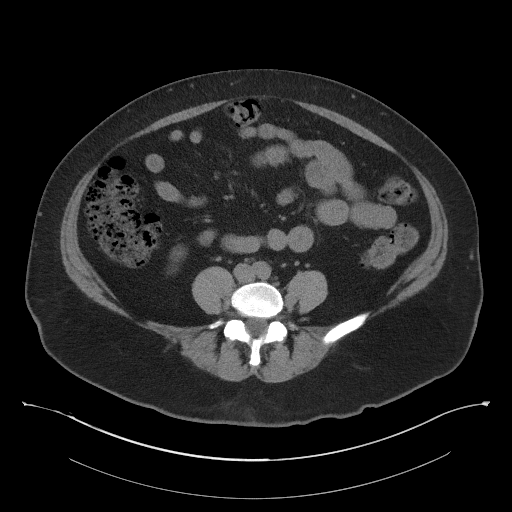
[im 48/85  soft-tissue]
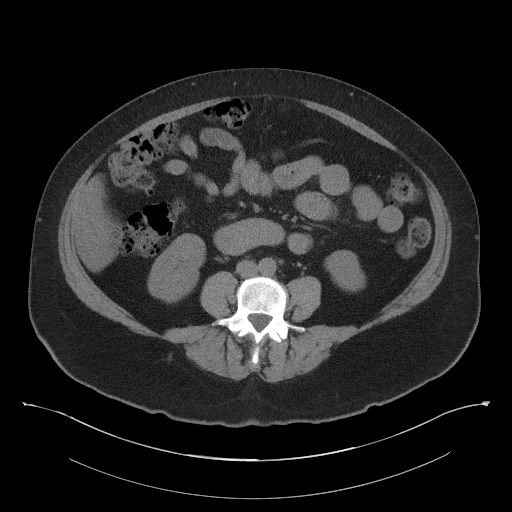
[im 54/85  soft-tissue]
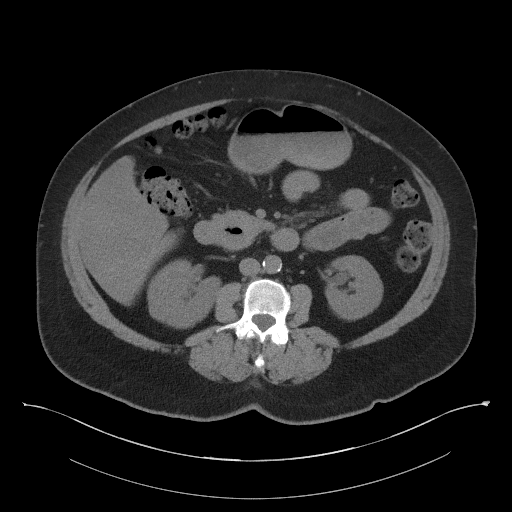
[im 54/85  bone]
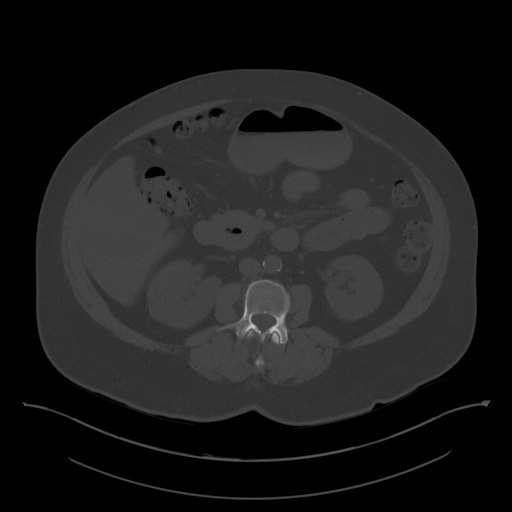
[im 61/85  soft-tissue]
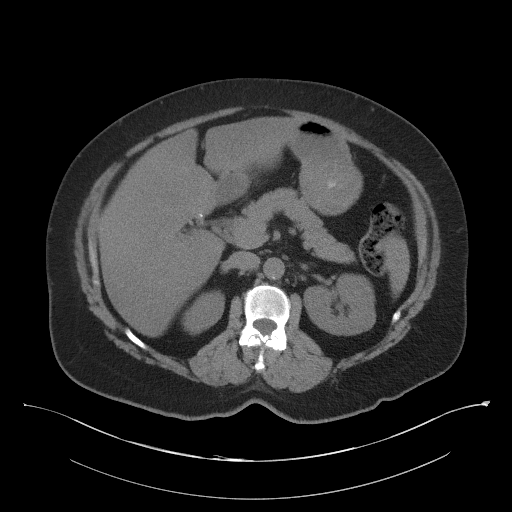
[im 68/85  soft-tissue]
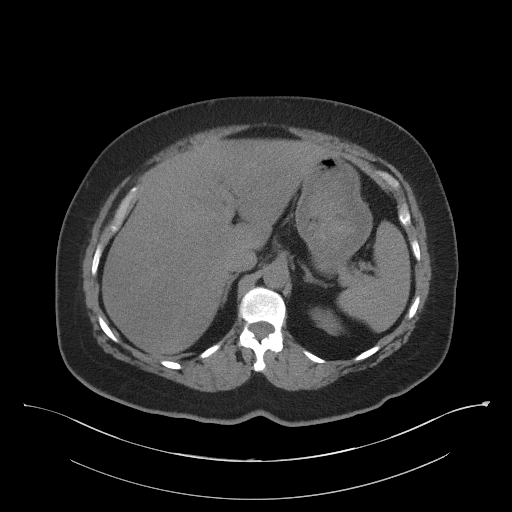
[im 74/85  soft-tissue]
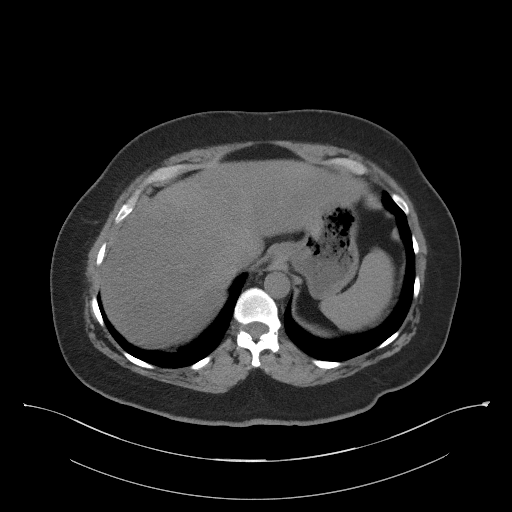
[im 81/85  soft-tissue]
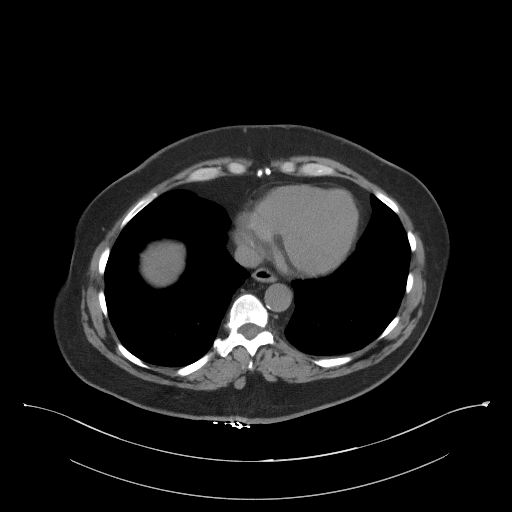

[Series 4: coronal st · coronal · 0.81mm/px · 3 of 98 slices shown]
[im 33/98  soft-tissue]
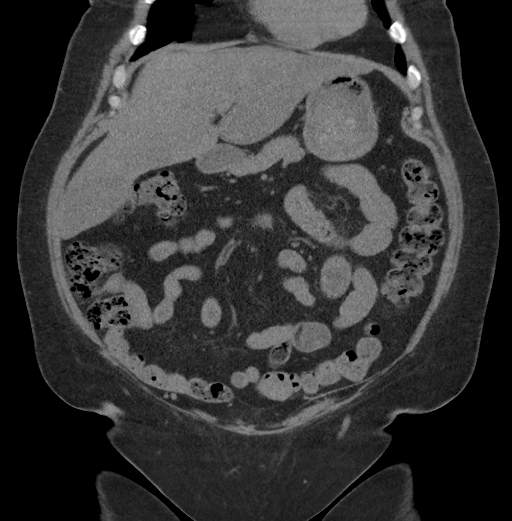
[im 44/98  soft-tissue]
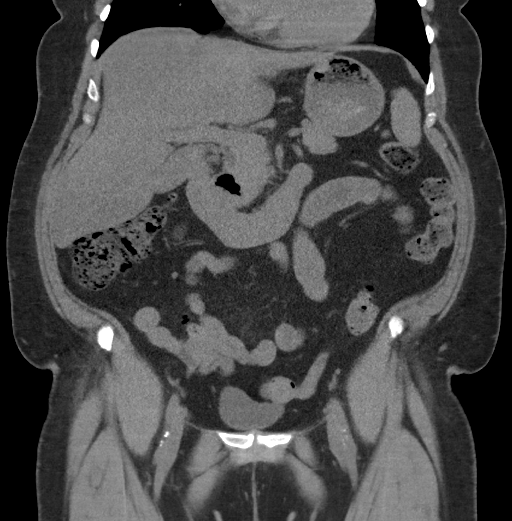
[im 54/98  soft-tissue]
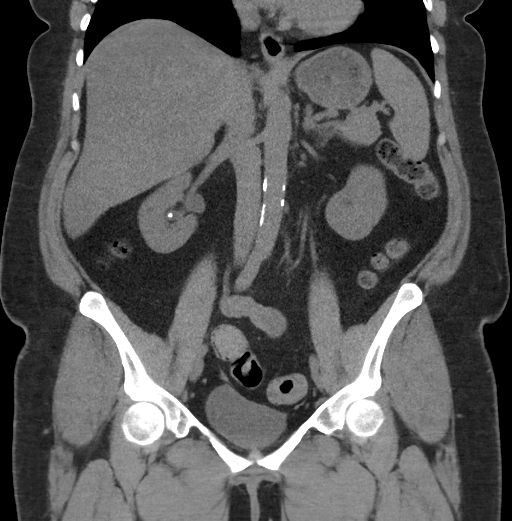

[16 of 46 positions shown; findings below may reference images not displayed]

FINDINGS: Lower chest: The lung bases are clear of acute process. No pleural
effusion or pulmonary lesions. The heart is normal in size. No
pericardial effusion. The distal esophagus and aorta are
unremarkable.

Hepatobiliary: No focal hepatic lesions or intrahepatic biliary
dilatation. The gallbladder is surgically absent. No common bile
duct dilatation. Diffuse fatty infiltration of the liver is noted.

Pancreas: No mass, inflammation or ductal dilatation. Small to
moderate-sized duodenal diverticulum noted near the pancreatic head.

Spleen: Normal size.  No focal lesions.

Adrenals/Urinary Tract: The adrenal glands are normal.

Small bilateral renal calculi but no obstructing ureteral calculi or
bladder calculi.

No obvious renal or bladder lesions without contrast.

Stomach/Bowel: The stomach, duodenum, small bowel and colon are
grossly normal. No acute inflammatory changes, mass lesions or
obstructive findings. The terminal ileum is normal. The appendix is
surgically absent.

Vascular/Lymphatic: The aorta is normal in caliber. Moderate age
advanced atheroscerlotic calcifications. No mesenteric of
retroperitoneal mass or adenopathy. Small scattered lymph nodes are
noted.

Reproductive: The uterus is surgically absent. Both ovaries are
still present and appear normal. There is a small cyst associated
with the right ovary.

Other: No pelvic mass or adenopathy. No free pelvic fluid
collections. No inguinal mass or adenopathy. No abdominal wall
hernia or subcutaneous lesions.

Musculoskeletal: No significant bony findings.  Stable scoliosis.
IMPRESSION: 1. No acute abdominal/pelvic findings, mass lesions or adenopathy
without IV or oral contrast.
2. Diffuse fatty infiltration of the liver.
3. Status post cholecystectomy.  No biliary dilatation.
4. Multiple bilateral renal calculi but no obstructing ureteral
calculi or bladder calculi.

## 2018-07-03 MED ORDER — SODIUM CHLORIDE 0.9 % IV BOLUS
500.0000 mL | Freq: Once | INTRAVENOUS | Status: AC
  Administered 2018-07-03: 500 mL via INTRAVENOUS

## 2018-07-03 MED ORDER — HYDROCODONE-ACETAMINOPHEN 5-325 MG PO TABS: 1.0000 | ORAL_TABLET | Freq: Four times a day (QID) | ORAL | 0 refills | Status: DC | PRN

## 2018-07-03 MED ORDER — MORPHINE SULFATE (PF) 4 MG/ML IV SOLN
4.0000 mg | Freq: Once | INTRAVENOUS | Status: AC
  Administered 2018-07-03: 4 mg via INTRAVENOUS
  Filled 2018-07-03: qty 1

## 2018-07-03 NOTE — ED Notes (Signed)
Pt/family verbalized understanding of discharge instructions.   

## 2018-07-03 NOTE — Discharge Instructions (Signed)
You likely had a recently passed kidney stone.  Make sure you are staying well hydrated with water.  Take diclofenac 2 times a day with meals.  Do not take other anti-inflammatories at the same time (Advil, Motrin, ibuprofen, naproxen, Aleve). You may supplement with Tylenol if you need further pain control. Use norco as needed for severe or breakthrough pain. Have caution, as this may make you tired or groggy. Do not drive or operate heavy machinery while taking this medication.  Follow up with your primary care if your symptoms are not improving. Return to the ER if you develop fevers, severe/worsening/persistent pain, persistent vomiting, inability to urinate, or any new or concerning symptoms.

## 2018-07-03 NOTE — ED Provider Notes (Signed)
[facility] EMERGENCY DEPARTMENT Provider Note   CSN: [id number] Arrival date & time: 07/03/18  1644     History   Chief Complaint Chief Complaint  Patient presents with  . Back Pain    HPI Ann Peterson is a 52 y.o. female presented for evaluation of left flank/lower quadrant pain.  Patient states for the past 4 days, she has been having left flank pain.  It began in her left upper back, and is gradually moved down her back into her lower quadrant.  Patient is described as a spasming pain.  It is constantly there, but will come and go.  Pain is worse with urination, no change with bowel movements.  Patient reports nausea without vomiting.  She denies fevers, chills, chest pain, shortness of breath, or abnormal bowel movements.  She denies dysuria, hematuria.  She has been taking a muscle relaxer and diclofenac for symptom control, muscle relaxers not helping, diclofenac eases the pain mildly.  She denies a history of kidney stones.  She has a history of hyperlipidemia, hypertension, and thyroidectomy causing hypothyroidism.  Abdominal surgeries include abdominal hysterectomy, appendectomy, and cholecystectomy.  HPI  Past Medical History:  Diagnosis Date  . Hyperlipidemia   . Hypertension   . Thyroid disease    thyroidectomy due to cancer    Patient Active Problem List   Diagnosis Date Noted  . Dysuria 01/19/2017  . Enlarged uterus 01/19/2017  . Menorrhagia 01/19/2017  . ADD (attention deficit disorder) 12/14/2010  . Hypothyroid 12/14/2010    Past Surgical History:  Procedure Laterality Date  . ABDOMINAL HYSTERECTOMY  09/12/2016  . TOTAL THYROIDECTOMY       OB History   None      Home Medications    Prior to Admission medications   Medication Sig Start Date End Date Taking? Authorizing Provider  cloNIDine (CATAPRES) 0.1 MG tablet Take 1 tablet (0.1 mg total) by mouth 2 (two) times daily. 12/16/17   Claretta Fraise, MD  cyclobenzaprine (FLEXERIL) 10 MG  tablet Take 1 tablet (10 mg total) by mouth 3 (three) times daily as needed for muscle spasms. 02/11/18   Claretta Fraise, MD  fluticasone [facility]) 50 MCG/ACT nasal spray SPRAY 2 SPRAYS INTO EACH NOSTRIL EVERY DAY 03/29/18   Claretta Fraise, MD  HYDROcodone-acetaminophen (NORCO/VICODIN) 5-325 MG tablet Take 1 tablet by mouth every 6 (six) hours as needed for severe pain. 07/03/18   Kairy Folsom, PA-C  levothyroxine (SYNTHROID, LEVOTHROID) 200 MCG tablet TAKE 1 TABLET (200 MCG TOTAL) BY MOUTH DAILY. 01/20/18   Claretta Fraise, MD  lisinopril (PRINIVIL,ZESTRIL) 20 MG tablet TAKE 1 TABLET (20 MG TOTAL) BY MOUTH DAILY. (NEED TO BE SEEN) 06/23/18   Claretta Fraise, MD  metoprolol succinate (TOPROL-XL) 100 MG 24 hr tablet Take 1 tablet (100 mg total) by mouth daily. Take with or immediately following a meal. 02/11/18   Claretta Fraise, MD  predniSONE (DELTASONE) 10 MG tablet Take 5 PO x 2 days, then 4 PO x 2 days, then 3 PO x 2days, then 2 PO x 2 days, then 1 PO x 2 days. 02/11/18   Claretta Fraise, MD    Family History Family History  Problem Relation Age of Onset  . Hypertension Mother   . Dementia Mother   . Macular degeneration Mother   . Stroke Mother   . COPD Father        lung cancer  . Cancer Brother        colon cancer  . Cancer Brother  brain tumor    Social History Social History   Tobacco Use  . Smoking status: Never Smoker  . Smokeless tobacco: Never Used  Substance Use Topics  . Alcohol use: No  . Drug use: No     Allergies   Pollen extract   Review of Systems Review of Systems  Genitourinary: Positive for flank pain.  All other systems reviewed and are negative.    Physical Exam Updated Vital Signs BP (!) 167/84 (BP Location: Left Arm)   Pulse 77   Temp 98.5 F (36.9 C) (Oral)   Resp 18   Ht 5\' 3"  (1.6 m)   Wt 89.8 kg   LMP 09/07/2016 (Exact Date)   SpO2 100%   BMI 35.07 kg/m   Physical Exam  Constitutional: She is oriented to person, place, and  time. She appears well-developed and well-nourished. No distress.  Resting in the bed in NAD  HENT:  Head: Normocephalic and atraumatic.  Eyes: Pupils are equal, round, and reactive to light. Conjunctivae and EOM are normal.  Neck: Normal range of motion. Neck supple.  Cardiovascular: Normal rate, regular rhythm and intact distal pulses.  Pulmonary/Chest: Effort normal and breath sounds normal. No respiratory distress. She has no wheezes.  Abdominal: Soft. Bowel sounds are normal. She exhibits no distension and no mass. There is tenderness in the left lower quadrant. There is no rebound and no guarding.  Mild TTP of L flank. TTP of LLQ.  There is palpation elsewhere in the abdomen.  Soft without rigidity, guarding or distention.  Negative rebound.  Musculoskeletal: Normal range of motion.  Neurological: She is alert and oriented to person, place, and time.  Skin: Skin is warm and dry. Capillary refill takes less than 2 seconds.  Psychiatric: She has a normal mood and affect.  Nursing note and vitals reviewed.    ED Treatments / Results  Labs (all labs ordered are listed, but only abnormal results are displayed) Labs Reviewed  BASIC METABOLIC PANEL - Abnormal; Notable for the following components:      Result Value   Glucose, Bld 107 (*)    Calcium 8.8 (*)    All other components within normal limits  URINALYSIS, ROUTINE W REFLEX MICROSCOPIC - Abnormal; Notable for the following components:   Hgb urine dipstick SMALL (*)    All other components within normal limits  URINALYSIS, MICROSCOPIC (REFLEX) - Abnormal; Notable for the following components:   Bacteria, UA RARE (*)    All other components within normal limits  URINE CULTURE  CBC WITH DIFFERENTIAL/PLATELET    EKG None  Radiology Ct Renal Stone Study  Result Date: 07/03/2018 CLINICAL DATA:  Lower back pain for 4 days. Left-sided abdominal pain for 2 days. EXAM: CT ABDOMEN AND PELVIS WITHOUT CONTRAST TECHNIQUE:  Multidetector CT imaging of the abdomen and pelvis was performed following the standard protocol without IV contrast. COMPARISON:  07/05/2011 FINDINGS: Lower chest: The lung bases are clear of acute process. No pleural effusion or pulmonary lesions. The heart is normal in size. No pericardial effusion. The distal esophagus and aorta are unremarkable. Hepatobiliary: No focal hepatic lesions or intrahepatic biliary dilatation. The gallbladder is surgically absent. No common bile duct dilatation. Diffuse fatty infiltration of the liver is noted. Pancreas: No mass, inflammation or ductal dilatation. Small to moderate-sized duodenal diverticulum noted near the pancreatic head. Spleen: Normal size.  No focal lesions. Adrenals/Urinary Tract: The adrenal glands are normal. Small bilateral renal calculi but no obstructing ureteral calculi or bladder calculi.  No obvious renal or bladder lesions without contrast. Stomach/Bowel: The stomach, duodenum, small bowel and colon are grossly normal. No acute inflammatory changes, mass lesions or obstructive findings. The terminal ileum is normal. The appendix is surgically absent. Vascular/Lymphatic: The aorta is normal in caliber. Moderate age advanced atheroscerlotic calcifications. No mesenteric of retroperitoneal mass or adenopathy. Small scattered lymph nodes are noted. Reproductive: The uterus is surgically absent. Both ovaries are still present and appear normal. There is a small cyst associated with the right ovary. Other: No pelvic mass or adenopathy. No free pelvic fluid collections. No inguinal mass or adenopathy. No abdominal wall hernia or subcutaneous lesions. Musculoskeletal: No significant bony findings.  Stable scoliosis. IMPRESSION: 1. No acute abdominal/pelvic findings, mass lesions or adenopathy without IV or oral contrast. 2. Diffuse fatty infiltration of the liver. 3. Status post cholecystectomy.  No biliary dilatation. 4. Multiple bilateral renal calculi but no  obstructing ureteral calculi or bladder calculi. Electronically Signed   By: Marijo Sanes M.D.   On: 07/03/2018 18:10    Procedures Procedures (including critical care time)  Medications Ordered in ED Medications  sodium chloride 0.9 % bolus 500 mL ( Intravenous Stopped 07/03/18 1824)  morphine 4 MG/ML injection 4 mg (4 mg Intravenous Given 07/03/18 1741)     Initial Impression / Assessment and Plan / ED Course  I have reviewed the triage vital signs and the nursing notes.  Pertinent labs & imaging results that were available during my care of the patient were reviewed by me and considered in my medical decision making (see chart for details).     Pt presenting for evaluation of left flank pain which is now in her left lower quadrant.  Physical exam reassuring, she is afebrile not tachycardic.  Appears nontoxic.  However, tenderness palpation of left lower quadrant, and tenderness of the left flank.  History is consistent with a possible kidney stone.  Symptoms have improved with diclofenac, not improving with muscle relaxers.  Will obtain urine, basic labs, and CT renal. Morphine and fluids for sx control  Urine with small blood, no signs of UTI.  Labs reassuring, no leukocytosis, kidney function reassuring.  CT renal pending.  on reassessment, patient reports symptoms are much improved.  CT renal without ureteral stone, shows multiple renal stones.  No obvious kidney infection, diverticulitis, or other cause for patient's pain.  Consider possible recently passed stone.  Discussed findings with patient.  Discussed f/u with pcp if sxs are not improving, will give short course of norco for likely pain due to recently passed stone. Discussed importance if hydration.  Time, patient appears safe for discharge.  Return precautions given.  Patient states she understands and agrees to plan.  Final Clinical Impressions(s) / ED Diagnoses   Final diagnoses:  Left flank pain    ED Discharge  Orders         Ordered    HYDROcodone-acetaminophen (NORCO/VICODIN) 5-325 MG tablet  Every 6 hours PRN     07/03/18 1851           Franchot Heidelberg, PA-C 07/03/18 2342    Davonna Belling, MD 07/07/18 1005

## 2018-07-03 NOTE — ED Triage Notes (Signed)
C/o left lower back pain x 4 days-was seen at [facility]-dx with muscle pain-now having left side abd pain x 2 days-NAD-steady gait

## 2018-07-05 LAB — URINE CULTURE: Culture: <10000 — AB

## 2018-07-09 DIAGNOSIS — N200 Calculus of kidney: Secondary | ICD-10-CM | POA: Diagnosis not present

## 2018-07-09 DIAGNOSIS — M549 Dorsalgia, unspecified: Secondary | ICD-10-CM | POA: Diagnosis not present

## 2018-09-16 ENCOUNTER — Other Ambulatory Visit: Payer: Self-pay | Admitting: Family Medicine

## 2018-09-17 NOTE — Telephone Encounter (Signed)
No voicemail set up. Unable to leave message.

## 2018-09-17 NOTE — Telephone Encounter (Signed)
Stacks. NTBS

## 2018-09-18 ENCOUNTER — Encounter: Payer: Self-pay | Admitting: Podiatry

## 2018-09-18 ENCOUNTER — Ambulatory Visit (INDEPENDENT_AMBULATORY_CARE_PROVIDER_SITE_OTHER): Payer: BLUE CROSS/BLUE SHIELD

## 2018-09-18 ENCOUNTER — Ambulatory Visit: Payer: BLUE CROSS/BLUE SHIELD | Admitting: Podiatry

## 2018-09-18 ENCOUNTER — Encounter

## 2018-09-18 DIAGNOSIS — B353 Tinea pedis: Secondary | ICD-10-CM | POA: Diagnosis not present

## 2018-09-18 DIAGNOSIS — M2011 Hallux valgus (acquired), right foot: Secondary | ICD-10-CM

## 2018-09-18 DIAGNOSIS — M779 Enthesopathy, unspecified: Secondary | ICD-10-CM | POA: Diagnosis not present

## 2018-09-18 DIAGNOSIS — M778 Other enthesopathies, not elsewhere classified: Secondary | ICD-10-CM

## 2018-09-18 DIAGNOSIS — H15101 Unspecified episcleritis, right eye: Secondary | ICD-10-CM | POA: Diagnosis not present

## 2018-09-18 MED ORDER — NAFTIFINE HCL 1 % EX GEL: 1.0000 "application " | Freq: Two times a day (BID) | CUTANEOUS | 1 refills | Status: DC

## 2018-09-18 NOTE — Progress Notes (Signed)
Subjective:  Patient ID: Ann Peterson, female    DOB: 06-16-1966,  MRN: [id number] HPI Chief Complaint  Patient presents with  . Foot Pain    1st MPJ right - bunion deformity for years, noticing other toes are being pushed against more, shoes are uncomfortable at times  . Skin Problem    Arch left - blistered lesions x months, itching, using cortisone cream  . New Patient (Initial Visit)    53 y.o. female presents with the above complaint.   ROS: Denies fever chills nausea vomiting muscle aches pains calf pain back pain chest pain shortness of breath.  Past Medical History:  Diagnosis Date  . Hyperlipidemia   . Hypertension   . Thyroid disease    thyroidectomy due to cancer   Past Surgical History:  Procedure Laterality Date  . ABDOMINAL HYSTERECTOMY  09/12/2016  . TOTAL THYROIDECTOMY      Current Outpatient Medications:  .  fluticasone (FLONASE) 50 MCG/ACT nasal spray, SPRAY 2 SPRAYS INTO EACH NOSTRIL EVERY DAY, Disp: 16 g, Rfl: 4 .  levothyroxine (SYNTHROID, LEVOTHROID) 200 MCG tablet, TAKE 1 TABLET (200 MCG TOTAL) BY MOUTH DAILY., Disp: 90 tablet, Rfl: 3 .  lisinopril (PRINIVIL,ZESTRIL) 20 MG tablet, TAKE 1 TABLET (20 MG TOTAL) BY MOUTH DAILY. (NEED TO BE SEEN), Disp: 30 tablet, Rfl: 2 .  metoprolol succinate (TOPROL-XL) 100 MG 24 hr tablet, Take 1 tablet (100 mg total) by mouth daily. Take with or immediately following a meal., Disp: 90 tablet, Rfl: 3 .  Naftifine HCl 1 % GEL, Apply 1 application topically 2 (two) times daily., Disp: 90 g, Rfl: 1  Allergies  Allergen Reactions  . Pollen Extract    Review of Systems Objective:  There were no vitals filed for this visit.  General: Well developed, nourished, in no acute distress, alert and oriented x3   Dermatological: Skin is warm, dry and supple bilateral. Nails x 10 are well maintained; remaining integument appears unremarkable at this time. There are no open sores, no preulcerative lesions, no rash or signs of  infection present.  Vascular: Dorsalis Pedis artery and Posterior Tibial artery pedal pulses are 2/4 bilateral with immedate capillary fill time. Pedal hair growth present. No varicosities and no lower extremity edema present bilateral.   Neruologic: Grossly intact via light touch bilateral. Vibratory intact via tuning fork bilateral. Protective threshold with Semmes Wienstein monofilament intact to all pedal sites bilateral. Patellar and Achilles deep tendon reflexes 2+ bilateral. No Babinski or clonus noted bilateral.   Musculoskeletal: No gross boney pedal deformities bilateral. No pain, crepitus, or limitation noted with foot and ankle range of motion bilateral. Muscular strength 5/5 in all groups tested bilateral.  She has pain on range of motion and limited range of motion of the first metatarsophalangeal joint with a hypertrophic medial condyle that is painful and erythematous.  She has pain on range of motion and palpation of this this is a bunion deformity that is resulting in hallux interphalangeal as well as osteoarthritic change.  Gait: Unassisted, Nonantalgic.    Radiographs:  Radiographs taken today demonstrate an increase in the first metatarsal angle of the right foot and hallux abductus angles increase.  As well as the hallux interphalangeal angle right foot.  Assessment & Plan:   Assessment: Tinea pedis vesicular in nature left foot.  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  Plan: We discussed etiology pathology conservative versus surgical therapies.  At this point wrote her prescription for Naftin gel to be applied  to the tinea pedis of the left foot.  We went over consent form today for right foot discussing surgical intervention consisting of an Austin bunion repair with screw and an Akin osteotomy with screw.  She understands this is amenable to it we did discuss the possible postop complications which may include but not limited to postop pain  bleeding swelling infection recurrence need for further surgery overcorrection under correction loss of digit loss of limb loss of life.  She understands this is amenable to it dispensed information regarding the surgery center today anesthesia and the morning of preop.  She understands this as well.  Dispensed a Cam walker she will follow-up with me the date of surgery.  I injected the first metatarsophalangeal joint today 30-gauge half inch needle with 10 mg of Kenalog and local anesthetic.  Tolerated procedure well without complications.     Max T. [place], [place]

## 2018-09-18 NOTE — Patient Instructions (Signed)
Pre-Operative Instructions  Congratulations, you have decided to take an important step towards improving your quality of life.  You can be assured that the doctors and staff at [facility] will be with you every step of the way.  Here are some important things you should know:  1. Plan to be at the surgery center/hospital at least 1 (one) hour prior to your scheduled time, unless otherwise directed by the surgical center/hospital staff.  You must have a responsible adult accompany you, remain during the surgery and drive you home.  Make sure you have directions to the surgical center/hospital to ensure you arrive on time. 2. If you are having surgery at [facility] or [facility] hospitals, you will need a copy of your medical history and physical form from your family physician within one month prior to the date of surgery. We will give you a form for your primary physician to complete.  3. We make every effort to accommodate the date you request for surgery.  However, there are times where surgery dates or times have to be moved.  We will contact you as soon as possible if a change in schedule is required.   4. No aspirin/ibuprofen for one week before surgery.  If you are on aspirin, any non-steroidal anti-inflammatory medications (Mobic, Aleve, Ibuprofen) should not be taken seven (7) days prior to your surgery.  You make take Tylenol for pain prior to surgery.  5. Medications - If you are taking daily heart and blood pressure medications, seizure, reflux, allergy, asthma, anxiety, pain or diabetes medications, make sure you notify the surgery center/hospital before the day of surgery so they can tell you which medications you should take or avoid the day of surgery. 6. No food or drink after midnight the night before surgery unless directed otherwise by surgical center/hospital staff. 7. No alcoholic beverages 24-hours prior to surgery.  No smoking 24-hours prior or 24-hours after  surgery. 8. Wear loose pants or shorts. They should be loose enough to fit over bandages, boots, and casts. 9. Don't wear slip-on shoes. Sneakers are preferred. 10. Bring your boot with you to the surgery center/hospital.  Also bring crutches or a walker if your physician has prescribed it for you.  If you do not have this equipment, it will be provided for you after surgery. 11. If you have not been contacted by the surgery center/hospital by the day before your surgery, call to confirm the date and time of your surgery. 12. Leave-time from work may vary depending on the type of surgery you have.  Appropriate arrangements should be made prior to surgery with your employer. 13. Prescriptions will be provided immediately following surgery by your doctor.  Fill these as soon as possible after surgery and take the medication as directed. Pain medications will not be refilled on weekends and must be approved by the doctor. 14. Remove nail polish on the operative foot and avoid getting pedicures prior to surgery. 15. Wash the night before surgery.  The night before surgery wash the foot and leg well with water and the antibacterial soap provided. Be sure to pay special attention to beneath the toenails and in between the toes.  Wash for at least three (3) minutes. Rinse thoroughly with water and dry well with a towel.  Perform this wash unless told not to do so by your physician.  Enclosed: 1 Ice pack (please put in freezer the night before surgery)   1 Hibiclens skin cleaner     Pre-op instructions  If you have any questions regarding the instructions, please do not hesitate to call our office.  [place]: [address], [place], [place] [address] -- [phone]  [place]: [address] [place], [place] [address] -- [phone]  [place]: [address]  [place], [place] [address] -- [phone]  [place]: [address], [address], [place],  [address] -- [phone]  Website:  [email or web address] 

## 2018-09-22 ENCOUNTER — Other Ambulatory Visit: Payer: Self-pay | Admitting: Family Medicine

## 2018-09-22 NOTE — Telephone Encounter (Signed)
Last seen 02/11/18  Dr Stacks 

## 2018-11-12 ENCOUNTER — Other Ambulatory Visit: Payer: Self-pay | Admitting: Neurosurgery

## 2018-11-12 DIAGNOSIS — M5412 Radiculopathy, cervical region: Secondary | ICD-10-CM

## 2018-11-18 ENCOUNTER — Ambulatory Visit
Admission: RE | Admit: 2018-11-18 | Discharge: 2018-11-18 | Disposition: A | Discharge status: Home / Self Care | Payer: BLUE CROSS/BLUE SHIELD | Source: Ambulatory Visit | Attending: Neurosurgery | Admitting: Neurosurgery

## 2018-11-18 DIAGNOSIS — M4802 Spinal stenosis, cervical region: Secondary | ICD-10-CM | POA: Diagnosis not present

## 2018-11-18 DIAGNOSIS — M5412 Radiculopathy, cervical region: Secondary | ICD-10-CM

## 2018-11-18 IMAGING — XA DG INJECT/[PERSON_NAME] INC NEEDLE/CATH/PLC EPI/CERV/THOR W/IMG
2 series · 2 of 2 positions shown · non-contrast
Comparison: none

CLINICAL DATA: Spondylosis without myelopathy. Multilevel foraminal
stenosis. Right arm and hand symptoms. Good response to the previous
injection but with recurrence of symptoms.

[Series 1: ortho standard · 1 of 1 slices shown (1 of 2)]
[im 1/1]
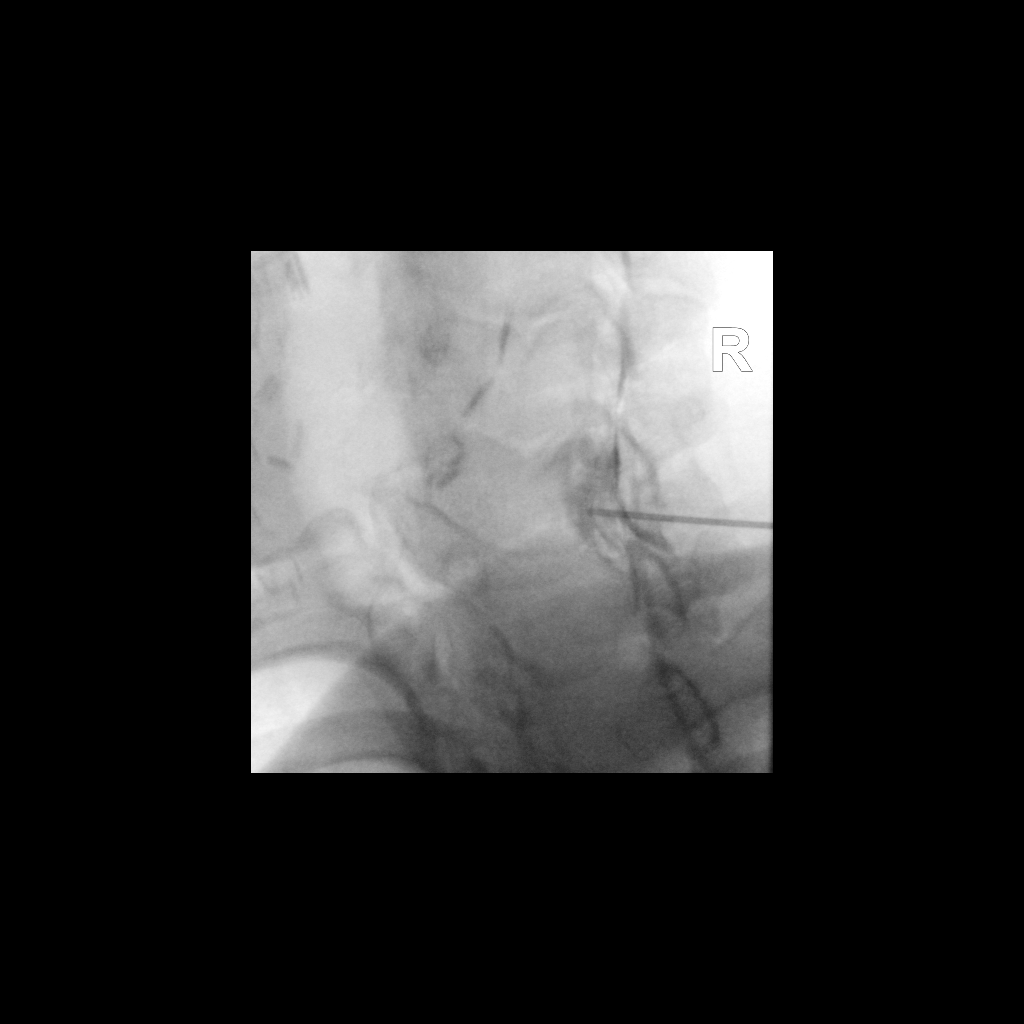

[Series 2: ortho standard · 1 of 1 slices shown (2 of 2)]
[im 1/1]
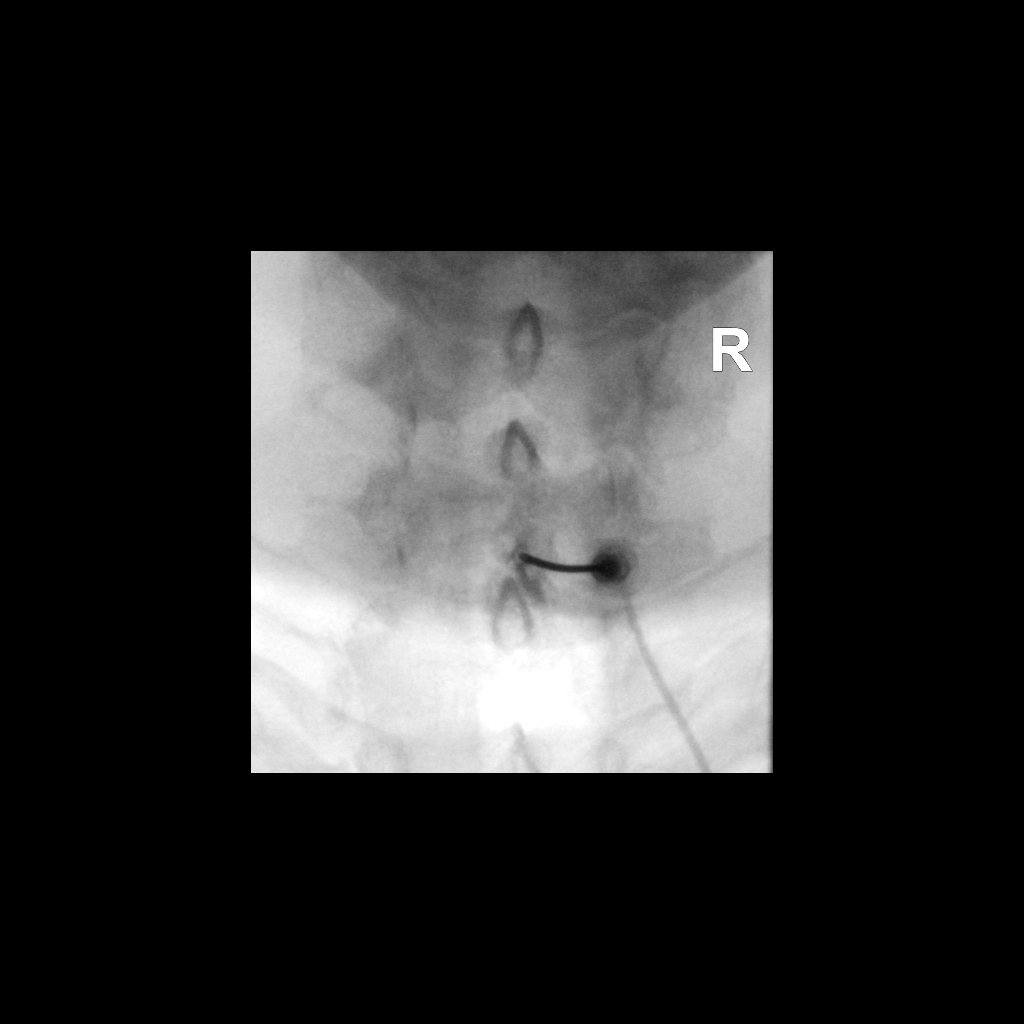

[2 of 2 positions shown; findings below may reference images not displayed]

FLUOROSCOPY TIME:  1 minutes 2 seconds. 53.53 micro gray meter
squared

PROCEDURE:
CERVICAL EPIDURAL INJECTION

An interlaminar approach was performed on the right at C7. A 20
gauge epidural needle was advanced using loss-of-resistance
technique.

DIAGNOSTIC EPIDURAL INJECTION

Injection of Isovue-M 300 shows a good epidural pattern with spread
above and below the level of needle placement, primarily on the
right, but to both sides. No vascular opacification is seen.
THERAPEUTIC

EPIDURAL INJECTION

1.5 ml of Kenalog 40 mixed with 1 ml of 1% Lidocaine and 2 ml of
normal saline were then instilled. The procedure was well-tolerated,
and the patient was discharged thirty minutes following the
injection in good condition.
IMPRESSION: Technically successful repeat epidural injection on the right at C7.

## 2018-11-18 MED ORDER — IOPAMIDOL (ISOVUE-M 300) INJECTION 61%
1.0000 mL | Freq: Once | INTRAMUSCULAR | Status: AC | PRN
  Administered 2018-11-18: 1 mL via EPIDURAL

## 2018-11-18 MED ORDER — TRIAMCINOLONE ACETONIDE 40 MG/ML IJ SUSP (RADIOLOGY)
60.0000 mg | Freq: Once | INTRAMUSCULAR | Status: AC
  Administered 2018-11-18: 60 mg via EPIDURAL

## 2018-11-18 NOTE — Discharge Instructions (Signed)

## 2018-12-16 DIAGNOSIS — M5412 Radiculopathy, cervical region: Secondary | ICD-10-CM | POA: Diagnosis not present

## 2018-12-18 DIAGNOSIS — M542 Cervicalgia: Secondary | ICD-10-CM | POA: Diagnosis not present

## 2018-12-18 DIAGNOSIS — M5412 Radiculopathy, cervical region: Secondary | ICD-10-CM | POA: Diagnosis not present

## 2018-12-21 ENCOUNTER — Other Ambulatory Visit: Payer: Self-pay | Admitting: Family Medicine

## 2018-12-22 NOTE — Telephone Encounter (Signed)
Last seen 02/11/18  Dr Livia Snellen

## 2018-12-25 DIAGNOSIS — M5412 Radiculopathy, cervical region: Secondary | ICD-10-CM | POA: Diagnosis not present

## 2019-01-01 DIAGNOSIS — M5011 Cervical disc disorder with radiculopathy,  high cervical region: Secondary | ICD-10-CM | POA: Diagnosis not present

## 2019-01-01 DIAGNOSIS — M5020 Other cervical disc displacement, unspecified cervical region: Secondary | ICD-10-CM | POA: Diagnosis not present

## 2019-01-01 DIAGNOSIS — M5412 Radiculopathy, cervical region: Secondary | ICD-10-CM | POA: Diagnosis not present

## 2019-01-01 DIAGNOSIS — M4722 Other spondylosis with radiculopathy, cervical region: Secondary | ICD-10-CM | POA: Diagnosis not present

## 2019-01-07 ENCOUNTER — Other Ambulatory Visit: Payer: Self-pay

## 2019-01-07 ENCOUNTER — Encounter: Payer: Self-pay | Admitting: Family Medicine

## 2019-01-07 ENCOUNTER — Ambulatory Visit (INDEPENDENT_AMBULATORY_CARE_PROVIDER_SITE_OTHER): Payer: BLUE CROSS/BLUE SHIELD | Admitting: Family Medicine

## 2019-01-07 DIAGNOSIS — J029 Acute pharyngitis, unspecified: Secondary | ICD-10-CM | POA: Diagnosis not present

## 2019-01-07 MED ORDER — CEFPROZIL 250 MG/5ML PO SUSR: 500.0000 mg | Freq: Two times a day (BID) | ORAL | 0 refills | Status: DC

## 2019-01-07 MED ORDER — METHYLPREDNISOLONE 8 MG PO TABS: ORAL_TABLET | ORAL | 0 refills | Status: DC

## 2019-01-07 MED ORDER — FLUCONAZOLE 150 MG PO TABS: 150.0000 mg | ORAL_TABLET | Freq: Once | ORAL | 0 refills | Status: AC

## 2019-01-07 NOTE — Progress Notes (Signed)
Subjective:    Patient ID: Ann Peterson, female    DOB: 1966/07/14, 53 y.o.   MRN: [id number]   HPI: Ann Peterson is a 53 y.o. female presenting for Patient presents with upper respiratory congestion. There is moderate to severe sore throat. There is no fever, chills or sweats. The patient denies being short of breath. Onset was 6 days ago. Gradually worsening. Tried OTCs without improvement.  C3-4 ACDF last week. Sore throat ever since. Throat feels swollen. Pills for pain getting stuck. Burning sensation. Like swallowing glass. Very red when she looks in her own throat.   Depression screen [facility] 2/9 02/11/2018 07/18/2017 01/19/2017 10/23/2016 10/24/2015  Decreased Interest 0 0 0 0 0  Down, Depressed, Hopeless 0 0 0 0 0  PHQ - 2 Score 0 0 0 0 0     Relevant past medical, surgical, family and social history reviewed and updated as indicated.  Interim medical history since our last visit reviewed. Allergies and medications reviewed and updated.  ROS:  Review of Systems  Constitutional: Negative for activity change, appetite change, chills and fever.  HENT: Positive for congestion and sore throat. Negative for ear discharge, ear pain, hearing loss, nosebleeds, postnasal drip, rhinorrhea, sinus pressure, sneezing and trouble swallowing.   Respiratory: Negative for chest tightness and shortness of breath.   Skin: Negative for rash.     Social History   Tobacco Use  Smoking Status Never Smoker  Smokeless Tobacco Never Used       Objective:     Wt Readings from Last 3 Encounters:  07/03/18 198 lb (89.8 kg)  02/11/18 200 lb 4 oz (90.8 kg)  12/09/17 201 lb 4 oz (91.3 kg)     Exam deferred. Pt. Harboring due to COVID 19. Phone visit performed.   Assessment & Plan:   1. Pharyngitis, unspecified etiology     Meds ordered this encounter  Medications  . methylPREDNISolone (MEDROL) 8 MG tablet    Sig: 4 daily for 2 days, then 3 daily for 2 days, then 2 daily for 2 days, then 1  daily for 2 days    Dispense:  20 tablet    Refill:  0  . cefPROZIL (CEFZIL) 250 MG/5ML suspension    Sig: Take 10 mLs (500 mg total) by mouth 2 (two) times daily. One tsp twice daily for ten days.    Dispense:  200 mL    Refill:  0  . fluconazole (DIFLUCAN) 150 MG tablet    Sig: Take 1 tablet (150 mg total) by mouth once for 1 dose. At onset of symptoms. Repeat at end of treatment    Dispense:  2 tablet    Refill:  0    No orders of the defined types were placed in this encounter.     Diagnoses and all orders for this visit:  Pharyngitis, unspecified etiology  Other orders -     methylPREDNISolone (MEDROL) 8 MG tablet; 4 daily for 2 days, then 3 daily for 2 days, then 2 daily for 2 days, then 1 daily for 2 days -     cefPROZIL (CEFZIL) 250 MG/5ML suspension; Take 10 mLs (500 mg total) by mouth 2 (two) times daily. One tsp twice daily for ten days. -     fluconazole (DIFLUCAN) 150 MG tablet; Take 1 tablet (150 mg total) by mouth once for 1 dose. At onset of symptoms. Repeat at end of treatment    Virtual Visit via telephone Note  I discussed  the limitations, risks, security and privacy concerns of performing an evaluation and management service by telephone and the availability of in person appointments. The patient was identified with two identifiers. Pt.expressed understanding and agreed to proceed. Pt. Is at home. Dr. Livia Snellen is in his office.  Follow Up Instructions:   I discussed the assessment and treatment plan with the patient. The patient was provided an opportunity to ask questions and all were answered. The patient agreed with the plan and demonstrated an unders The patient was advised to call back or seek an in-person evaluation if the symptoms worsen or if the condition fails to improve as anticipated.  Visit started: 9:07 Call ended:  9:16 Total minutes including chart review and phone contact time: 12   Follow up plan: Return if symptoms worsen or fail to  improve.  Claretta Fraise, MD [place]

## 2019-01-25 ENCOUNTER — Other Ambulatory Visit: Payer: Self-pay | Admitting: Family Medicine

## 2019-01-25 DIAGNOSIS — J302 Other seasonal allergic rhinitis: Secondary | ICD-10-CM

## 2019-02-10 ENCOUNTER — Other Ambulatory Visit: Payer: Self-pay | Admitting: Family Medicine

## 2019-02-10 DIAGNOSIS — M5412 Radiculopathy, cervical region: Secondary | ICD-10-CM | POA: Diagnosis not present

## 2019-02-10 NOTE — Telephone Encounter (Signed)
Stacks. NTBS LOV 02/11/18. 30 days sent to pharmacy

## 2019-03-07 ENCOUNTER — Other Ambulatory Visit: Payer: Self-pay | Admitting: Family Medicine

## 2019-03-09 NOTE — Telephone Encounter (Signed)
[id number] 6/29

## 2019-03-09 NOTE — Telephone Encounter (Signed)
Stacks. NTBS. 30 days given 02/10/19

## 2019-03-17 ENCOUNTER — Ambulatory Visit (INDEPENDENT_AMBULATORY_CARE_PROVIDER_SITE_OTHER): Payer: BC Managed Care – PPO | Admitting: Family Medicine

## 2019-03-17 ENCOUNTER — Encounter: Payer: Self-pay | Admitting: Family Medicine

## 2019-03-17 ENCOUNTER — Other Ambulatory Visit: Payer: Self-pay

## 2019-03-17 DIAGNOSIS — J302 Other seasonal allergic rhinitis: Secondary | ICD-10-CM | POA: Diagnosis not present

## 2019-03-17 DIAGNOSIS — R0683 Snoring: Secondary | ICD-10-CM

## 2019-03-17 DIAGNOSIS — E039 Hypothyroidism, unspecified: Secondary | ICD-10-CM | POA: Diagnosis not present

## 2019-03-17 MED ORDER — LISINOPRIL 20 MG PO TABS: 10.0000 mg | ORAL_TABLET | Freq: Every day | ORAL | 1 refills | Status: DC

## 2019-03-17 MED ORDER — LEVOTHYROXINE SODIUM 200 MCG PO TABS: ORAL_TABLET | ORAL | 1 refills | Status: DC

## 2019-03-17 MED ORDER — METOPROLOL SUCCINATE ER 100 MG PO TB24: 100.0000 mg | ORAL_TABLET | Freq: Every day | ORAL | 1 refills | Status: DC

## 2019-03-17 NOTE — Progress Notes (Signed)
Subjective:    Patient ID: Ann Peterson, female    DOB: 1966-04-11, 53 y.o.   MRN: [id number]   HPI: Ann Peterson is a 53 y.o. female presenting for patient presents for follow-up on  thyroid. The patient has a history of hypothyroidism for many years. It has been stable recently. Pt. denies any change in  voice, loss of hair, heat or cold intolerance. Energy level has been adequate to good. Patient denies constipation and diarrhea. No myxedema. Medication is as noted below. Verified that pt is taking it daily on an empty stomach. Well tolerated.     Follow-up of hypertension. Patient has no history of headache chest pain or shortness of breath or recent cough. Patient also denies symptoms of TIA such as numbness weakness lateralizing. Patient checks  blood pressure at home and has not had any elevated readings recently. Patient denies side effects from his medication. States taking it regularly.   Patient recently had ACDF and is 90% pain-free and back at work.  This was about 6 weeks ago.  She went back to work after just 4 weeks.  She is having a little bit of trouble sleeping.  We discussed sleep hygiene. Depression screen [facility] 2/9 02/11/2018 07/18/2017 01/19/2017 10/23/2016 10/24/2015  Decreased Interest 0 0 0 0 0  Down, Depressed, Hopeless 0 0 0 0 0  PHQ - 2 Score 0 0 0 0 0     Relevant past medical, surgical, family and social history reviewed and updated as indicated.  Interim medical history since our last visit reviewed. Allergies and medications reviewed and updated.  ROS:  Review of Systems  Constitutional: Negative.   HENT: Negative for congestion.   Eyes: Negative for visual disturbance.  Respiratory: Negative for shortness of breath.   Cardiovascular: Negative for chest pain.  Gastrointestinal: Negative for abdominal pain, constipation, diarrhea, nausea and vomiting.  Genitourinary: Negative for difficulty urinating.  Musculoskeletal: Negative for arthralgias and myalgias.   Neurological: Negative for headaches.  Psychiatric/Behavioral: Positive for sleep disturbance (mild insomnia for the last few weeks. Has put on weight. Husband says sshe snores a lot.).     Social History   Tobacco Use  Smoking Status Never Smoker  Smokeless Tobacco Never Used       Objective:     Wt Readings from Last 3 Encounters:  07/03/18 198 lb (89.8 kg)  02/11/18 200 lb 4 oz (90.8 kg)  12/09/17 201 lb 4 oz (91.3 kg)     Exam deferred. Pt. Harboring due to COVID 19. Phone visit performed.   Assessment & Plan:   1. Hypothyroidism, unspecified type   2. Snoring   3. Other seasonal allergic rhinitis     Meds ordered this encounter  Medications  . levothyroxine (SYNTHROID) 200 MCG tablet    Sig: TAKE 1 TABLET (200 MCG TOTAL) BY MOUTH DAILY.    Dispense:  90 tablet    Refill:  1  . metoprolol succinate (TOPROL-XL) 100 MG 24 hr tablet    Sig: Take 1 tablet (100 mg total) by mouth daily. Take with or immediately following a meal.(Needs to be seen before next refill)    Dispense:  90 tablet    Refill:  1  . lisinopril (ZESTRIL) 20 MG tablet    Sig: Take 0.5 tablets (10 mg total) by mouth daily. (Need to be seen)    Dispense:  90 tablet    Refill:  1    Orders Placed This Encounter  Procedures  .  TSH + free T4  . T3, free  . CMP14+EGFR    Order Specific Question:   Has the patient fasted?    Answer:   Yes  . CBC with Differential/Platelet  . Ambulatory referral to Sleep Studies    Referral Priority:   Routine    Referral Type:   Consultation    Referral Reason:   Specialty Services Required    Number of Visits Requested:   1      Diagnoses and all orders for this visit:  Hypothyroidism, unspecified type -     TSH + free T4 -     T3, free -     CMP14+EGFR -     CBC with Differential/Platelet  Snoring -     Ambulatory referral to Sleep Studies  Other seasonal allergic rhinitis  Other orders -     levothyroxine (SYNTHROID) 200 MCG tablet; TAKE 1  TABLET (200 MCG TOTAL) BY MOUTH DAILY. -     metoprolol succinate (TOPROL-XL) 100 MG 24 hr tablet; Take 1 tablet (100 mg total) by mouth daily. Take with or immediately following a meal.(Needs to be seen before next refill) -     lisinopril (ZESTRIL) 20 MG tablet; Take 0.5 tablets (10 mg total) by mouth daily. (Need to be seen)    Virtual Visit via telephone Note  I discussed the limitations, risks, security and privacy concerns of performing an evaluation and management service by telephone and the availability of in person appointments. The patient was identified with two identifiers. Pt.expressed understanding and agreed to proceed. Pt. Is at home. Dr. Livia Snellen is in his office.  Follow Up Instructions:   I discussed the assessment and treatment plan with the patient. The patient was provided an opportunity to ask questions and all were answered. The patient agreed with the plan and demonstrated an understanding of the instructions.   The patient was advised to call back or seek an in-person evaluation if the symptoms worsen or if the condition fails to improve as anticipated.   Total minutes including chart review and phone contact time: 20   Follow up plan: Return in about 6 months (around 09/17/2019).  Claretta Fraise, MD [place]

## 2019-04-24 ENCOUNTER — Other Ambulatory Visit: Payer: Self-pay | Admitting: Family Medicine

## 2019-04-24 ENCOUNTER — Other Ambulatory Visit: Payer: Self-pay

## 2019-04-24 ENCOUNTER — Other Ambulatory Visit: Payer: BC Managed Care – PPO

## 2019-04-24 DIAGNOSIS — N921 Excessive and frequent menstruation with irregular cycle: Secondary | ICD-10-CM

## 2019-04-24 DIAGNOSIS — E039 Hypothyroidism, unspecified: Secondary | ICD-10-CM | POA: Diagnosis not present

## 2019-04-24 DIAGNOSIS — R030 Elevated blood-pressure reading, without diagnosis of hypertension: Secondary | ICD-10-CM

## 2019-04-25 LAB — CMP14+EGFR
ALT: 26 IU/L (ref 0–32)
AST: 18 IU/L (ref 0–40)
Albumin/Globulin Ratio: 2.3 — ABNORMAL HIGH (ref 1.2–2.2)
Albumin: 4.3 g/dL (ref 3.8–4.9)
Alkaline Phosphatase: 70 IU/L (ref 39–117)
BUN/Creatinine Ratio: 23 (ref 9–23)
BUN: 19 mg/dL (ref 6–24)
Bilirubin Total: 0.3 mg/dL (ref 0.0–1.2)
CO2: 22 mmol/L (ref 20–29)
Calcium: 9.1 mg/dL (ref 8.7–10.2)
Chloride: 102 mmol/L (ref 96–106)
Creatinine, Ser: 0.83 mg/dL (ref 0.57–1.00)
GFR calc Af Amer: 94 mL/min/{1.73_m2} (ref >59)
GFR calc non Af Amer: 81 mL/min/{1.73_m2} (ref >59)
Globulin, Total: 1.9 g/dL (ref 1.5–4.5)
Glucose: 81 mg/dL (ref 65–99)
Potassium: 4.1 mmol/L (ref 3.5–5.2)
Sodium: 140 mmol/L (ref 134–144)
Total Protein: 6.2 g/dL (ref 6.0–8.5)

## 2019-04-25 LAB — CBC WITH DIFFERENTIAL/PLATELET
Basophils Absolute: 0.1 10*3/uL (ref 0.0–0.2)
Basos: 1 %
EOS (ABSOLUTE): 0.1 10*3/uL (ref 0.0–0.4)
Eos: 1 %
Hematocrit: 43.2 % (ref 34.0–46.6)
Hemoglobin: 14.9 g/dL (ref 11.1–15.9)
Immature Grans (Abs): 0 10*3/uL (ref 0.0–0.1)
Immature Granulocytes: 1 %
Lymphocytes Absolute: 1.8 10*3/uL (ref 0.7–3.1)
Lymphs: 23 %
MCH: 30.7 pg (ref 26.6–33.0)
MCHC: 34.5 g/dL (ref 31.5–35.7)
MCV: 89 fL (ref 79–97)
Monocytes Absolute: 0.6 10*3/uL (ref 0.1–0.9)
Monocytes: 8 %
Neutrophils Absolute: 5.3 10*3/uL (ref 1.4–7.0)
Neutrophils: 66 %
Platelets: 235 10*3/uL (ref 150–450)
RBC: 4.85 x10E6/uL (ref 3.77–5.28)
RDW: 12.4 % (ref 11.7–15.4)
WBC: 7.9 10*3/uL (ref 3.4–10.8)

## 2019-04-25 LAB — THYROID PANEL WITH TSH
Free Thyroxine Index: 2.8 (ref 1.2–4.9)
T3 Uptake Ratio: 29 % (ref 24–39)
T4, Total: 9.7 ug/dL (ref 4.5–12.0)
TSH: 0.263 u[IU]/mL — ABNORMAL LOW (ref 0.450–4.500)

## 2019-04-26 NOTE — Progress Notes (Signed)
Hello Ann Peterson,  Your lab result is normal and/or stable.Some minor variations that are not significant are commonly marked abnormal, but do not represent any medical problem for you.  Best regards, Claretta Fraise, M.D.

## 2019-05-08 ENCOUNTER — Encounter: Payer: Self-pay | Admitting: Physician Assistant

## 2019-05-08 ENCOUNTER — Ambulatory Visit (INDEPENDENT_AMBULATORY_CARE_PROVIDER_SITE_OTHER): Payer: BC Managed Care – PPO | Admitting: Physician Assistant

## 2019-05-08 DIAGNOSIS — I10 Essential (primary) hypertension: Secondary | ICD-10-CM | POA: Diagnosis not present

## 2019-05-08 DIAGNOSIS — E039 Hypothyroidism, unspecified: Secondary | ICD-10-CM

## 2019-05-08 MED ORDER — LEVOTHYROXINE SODIUM 175 MCG PO TABS: ORAL_TABLET | ORAL | 0 refills | Status: DC

## 2019-05-08 MED ORDER — LISINOPRIL 20 MG PO TABS: 20.0000 mg | ORAL_TABLET | Freq: Every day | ORAL | 0 refills | Status: DC

## 2019-05-08 MED ORDER — AMLODIPINE BESYLATE 10 MG PO TABS: 10.0000 mg | ORAL_TABLET | Freq: Every day | ORAL | 0 refills | Status: DC

## 2019-05-10 ENCOUNTER — Encounter: Payer: Self-pay | Admitting: Physician Assistant

## 2019-05-10 NOTE — Progress Notes (Signed)
Telephone visit  Subjective: [id number] pain and BP PCP: Claretta Fraise, MD [id number] Ann Peterson is a 53 y.o. female calls for telephone consult today. Patient provides verbal consent for consult held via phone.  Patient is identified with 2 separate identifiers.  At this time the entire area is on COVID-19 social distancing and stay home orders are in place.  Patient is of higher risk and therefore we are performing this by a virtual method.  Location of patient: home Location of provider: [facility] Others present for call: no  This patient has a complaint of elevated blood pressure and some chest pain.  She stated the on the previous night while she is sleeping she had a lot of pain in her chest I could feel the palpitation.  She does have known blood pressure elevation.  It has been quite elevated in the past.  She has been taking lisinopril 20 mg a day. She does work in a hospital in the operating office.  An EKG was done today and it was normal.  Her blood pressure readings were quite elevated even up to 170/100.  She has not had any pain that went up into her neck or throat.  She is not had any specific diaphoretic episodes.  I have encouraged her to come and have labs performed.  In the meantime we will go ahead and start amlodipine 10 mg 1 daily.  The prescription was sent to the pharmacy.  ROS: Per HPI  Allergies  Allergen Reactions  . Pollen Extract    Past Medical History:  Diagnosis Date  . Hyperlipidemia   . Hypertension   . Thyroid disease    thyroidectomy due to cancer    Current Outpatient Medications:  .  amLODipine (NORVASC) 10 MG tablet, Take 1 tablet (10 mg total) by mouth daily., Disp: 90 tablet, Rfl: 0 .  fluticasone (FLONASE) 50 MCG/ACT nasal spray, SPRAY 2 SPRAYS INTO EACH NOSTRIL EVERY DAY, Disp: 48 g, Rfl: 1 .  levothyroxine (SYNTHROID) 175 MCG tablet, TAKE 1 TABLET (200 MCG TOTAL) BY MOUTH DAILY., Disp: 90 tablet, Rfl: 0 .  lisinopril (ZESTRIL) 20 MG tablet,  Take 1 tablet (20 mg total) by mouth daily. (Need to be seen), Disp: 90 tablet, Rfl: 0 .  metoprolol succinate (TOPROL-XL) 100 MG 24 hr tablet, Take 1 tablet (100 mg total) by mouth daily. Take with or immediately following a meal.(Needs to be seen before next refill), Disp: 90 tablet, Rfl: 1 .  Naftifine HCl 1 % GEL, Apply 1 application topically 2 (two) times daily., Disp: 90 g, Rfl: 1  Assessment/ Plan: 53 y.o. female   1. Essential hypertension - amLODipine (NORVASC) 10 MG tablet; Take 1 tablet (10 mg total) by mouth daily.  Dispense: 90 tablet; Refill: 0 - lisinopril (ZESTRIL) 20 MG tablet; Take 1 tablet (20 mg total) by mouth daily. (Need to be seen)  Dispense: 90 tablet; Refill: 0  2. Hypothyroidism, unspecified type - levothyroxine (SYNTHROID) 175 MCG tablet; TAKE 1 TABLET (200 MCG TOTAL) BY MOUTH DAILY.  Dispense: 90 tablet; Refill: 0   Return in about 4 weeks (around 06/05/2019) for PCP for BP and labs.  Continue all other maintenance medications as listed above.  Start time: 4:15 PM End time: 4:23 PM  Meds ordered this encounter  Medications  . amLODipine (NORVASC) 10 MG tablet    Sig: Take 1 tablet (10 mg total) by mouth daily.    Dispense:  90 tablet    Refill:  0  Order Specific Question:   Supervising Provider    Answer:   Janora Norlander [id number]  . levothyroxine (SYNTHROID) 175 MCG tablet    Sig: TAKE 1 TABLET (200 MCG TOTAL) BY MOUTH DAILY.    Dispense:  90 tablet    Refill:  0    Order Specific Question:   Supervising Provider    Answer:   Janora Norlander [id number]  . lisinopril (ZESTRIL) 20 MG tablet    Sig: Take 1 tablet (20 mg total) by mouth daily. (Need to be seen)    Dispense:  90 tablet    Refill:  0    Order Specific Question:   Supervising Provider    Answer:   Janora Norlander [id number]    Particia Nearing PA-C [place] [phone]

## 2019-05-21 ENCOUNTER — Encounter: Payer: Self-pay | Admitting: Family Medicine

## 2019-05-22 ENCOUNTER — Encounter: Payer: Self-pay | Admitting: Family Medicine

## 2019-05-22 ENCOUNTER — Ambulatory Visit (INDEPENDENT_AMBULATORY_CARE_PROVIDER_SITE_OTHER): Payer: BC Managed Care – PPO | Admitting: Family Medicine

## 2019-05-22 DIAGNOSIS — B029 Zoster without complications: Secondary | ICD-10-CM | POA: Diagnosis not present

## 2019-05-22 MED ORDER — VALACYCLOVIR HCL 1 G PO TABS: 1000.0000 mg | ORAL_TABLET | Freq: Two times a day (BID) | ORAL | 0 refills | Status: DC

## 2019-05-22 MED ORDER — GABAPENTIN 100 MG PO CAPS: 100.0000 mg | ORAL_CAPSULE | Freq: Every day | ORAL | 1 refills | Status: DC

## 2019-05-22 NOTE — Progress Notes (Signed)
   Virtual Visit via telephone Note  I connected with Ann Peterson on 05/22/19 at 1616 by telephone and verified that I am speaking with the correct person using two identifiers. Ann Peterson is currently located at home and no other people are currently with her during visit. The provider, Fransisca Kaufmann Kaimana Lurz, MD is located in their office at time of visit.  Call ended at 1625  I discussed the limitations, risks, security and privacy concerns of performing an evaluation and management service by telephone and the availability of in person appointments. I also discussed with the patient that there may be a patient responsible charge related to this service. The patient expressed understanding and agreed to proceed.   History and Present Illness: Patient is calling in for rash and pain and she thinks it is shingles and it started around her braline and has come around to the front and underneath breast on upper abdomen.  It is very painful to the touch.   No diagnosis found.  Review of Systems  Constitutional: Negative for chills and fever.  Eyes: Negative for visual disturbance.  Respiratory: Negative for chest tightness and shortness of breath.   Cardiovascular: Negative for chest pain and leg swelling.  Skin: Positive for rash.  Neurological: Positive for numbness.  All other systems reviewed and are negative.   Observations/Objective: Patient sounds comfortable and in no acute distress  Assessment and Plan: Problem List Items Addressed This Visit    None    Visit Diagnoses    Herpes zoster without complication    -  Primary   Relevant Medications   valACYclovir (VALTREX) 1000 MG tablet   gabapentin (NEURONTIN) 100 MG capsule       Follow Up Instructions: As needed   I discussed the assessment and treatment plan with the patient. The patient was provided an opportunity to ask questions and all were answered. The patient agreed with the plan and demonstrated an  understanding of the instructions.   The patient was advised to call back or seek an in-person evaluation if the symptoms worsen or if the condition fails to improve as anticipated.  The above assessment and management plan was discussed with the patient. The patient verbalized understanding of and has agreed to the management plan. Patient is aware to call the clinic if symptoms persist or worsen. Patient is aware when to return to the clinic for a follow-up visit. Patient educated on when it is appropriate to go to the emergency department.    I provided 9 minutes of non-face-to-face time during this encounter.    Worthy Rancher, MD

## 2019-05-27 ENCOUNTER — Encounter: Payer: Self-pay | Admitting: Family Medicine

## 2019-05-27 LAB — PULMONARY FUNCTION TEST

## 2019-06-02 ENCOUNTER — Encounter: Payer: Self-pay | Admitting: Family Medicine

## 2019-06-03 ENCOUNTER — Encounter: Payer: Self-pay | Admitting: Family Medicine

## 2019-06-26 DIAGNOSIS — G4733 Obstructive sleep apnea (adult) (pediatric): Secondary | ICD-10-CM | POA: Diagnosis not present

## 2019-07-17 ENCOUNTER — Other Ambulatory Visit: Payer: Self-pay | Admitting: Family Medicine

## 2019-07-17 DIAGNOSIS — J302 Other seasonal allergic rhinitis: Secondary | ICD-10-CM

## 2019-07-27 DIAGNOSIS — G4733 Obstructive sleep apnea (adult) (pediatric): Secondary | ICD-10-CM | POA: Diagnosis not present

## 2019-08-02 ENCOUNTER — Other Ambulatory Visit: Payer: Self-pay | Admitting: Physician Assistant

## 2019-08-02 DIAGNOSIS — I10 Essential (primary) hypertension: Secondary | ICD-10-CM

## 2019-08-13 DIAGNOSIS — Z9189 Other specified personal risk factors, not elsewhere classified: Secondary | ICD-10-CM | POA: Diagnosis not present

## 2019-08-13 DIAGNOSIS — U071 COVID-19: Secondary | ICD-10-CM | POA: Diagnosis not present

## 2019-08-13 DIAGNOSIS — R05 Cough: Secondary | ICD-10-CM | POA: Diagnosis not present

## 2019-08-26 ENCOUNTER — Other Ambulatory Visit: Payer: Self-pay | Admitting: Family Medicine

## 2019-08-26 DIAGNOSIS — G4733 Obstructive sleep apnea (adult) (pediatric): Secondary | ICD-10-CM | POA: Diagnosis not present

## 2019-08-28 ENCOUNTER — Other Ambulatory Visit: Payer: Self-pay | Admitting: Podiatry

## 2019-08-28 ENCOUNTER — Telehealth: Payer: Self-pay | Admitting: Podiatry

## 2019-08-28 MED ORDER — CIPROFLOXACIN HCL 500 MG PO TABS: 500.0000 mg | ORAL_TABLET | Freq: Two times a day (BID) | ORAL | 0 refills | Status: DC

## 2019-08-28 NOTE — Telephone Encounter (Signed)
[facility] called stating they received a voice message for a prescription refill for patient but the message was broken up. They were unable to make out the message, Please give the pharmacy a call back to clarify.

## 2019-09-26 DIAGNOSIS — G4733 Obstructive sleep apnea (adult) (pediatric): Secondary | ICD-10-CM | POA: Diagnosis not present

## 2019-10-19 ENCOUNTER — Encounter: Payer: Self-pay | Admitting: Family Medicine

## 2019-10-19 ENCOUNTER — Other Ambulatory Visit: Payer: Self-pay

## 2019-10-19 ENCOUNTER — Ambulatory Visit: Payer: BC Managed Care – PPO | Admitting: Family Medicine

## 2019-10-19 VITALS — BP 123/72 | HR 73 | Temp 98.6°F | Ht 63.0 in | Wt 200.0 lb

## 2019-10-19 DIAGNOSIS — I10 Essential (primary) hypertension: Secondary | ICD-10-CM

## 2019-10-19 DIAGNOSIS — F411 Generalized anxiety disorder: Secondary | ICD-10-CM

## 2019-10-19 DIAGNOSIS — F4321 Adjustment disorder with depressed mood: Secondary | ICD-10-CM

## 2019-10-19 DIAGNOSIS — E039 Hypothyroidism, unspecified: Secondary | ICD-10-CM

## 2019-10-19 MED ORDER — CLONAZEPAM 0.5 MG PO TBDP: 0.5000 mg | ORAL_TABLET | Freq: Two times a day (BID) | ORAL | 0 refills | Status: DC | PRN

## 2019-10-19 NOTE — Progress Notes (Signed)
Subjective:  Patient ID: Ann Peterson, female    DOB: 1966/02/09  Age: 54 y.o. MRN: [id number]  CC: Pierre (Husband passed away 5 days ago)   HPI Ann Peterson presents for  Tremendous stress and anxiety due to the death of her husband five days ago from cirrhosis. She can't sleep. Appetite is poor. She is tearful. She feels she can not focus or concentrate adequately to perform her duties at work.Asks for advice regarding taking time off.   Depression screen [facility] 2/9 10/19/2019 02/11/2018 07/18/2017  Decreased Interest 0 0 0  Down, Depressed, Hopeless 0 0 0  PHQ - 2 Score 0 0 0    History Ann Peterson has a past medical history of Hyperlipidemia, Hypertension, and Thyroid disease.   She has a past surgical history that includes Total thyroidectomy and Abdominal hysterectomy (09/12/2016).   Her family history includes COPD in her father; Cancer in her brother and brother; Dementia in her mother; Hypertension in her mother; Macular degeneration in her mother; Stroke in her mother.She reports that she has never smoked. She has never used smokeless tobacco. She reports that she does not drink alcohol or use drugs.    ROS Review of Systems  Constitutional: Negative.   HENT: Negative.   Eyes: Negative for visual disturbance.  Respiratory: Negative for shortness of breath.   Cardiovascular: Negative for chest pain.  Gastrointestinal: Negative for abdominal pain.  Musculoskeletal: Negative for arthralgias.    Objective:  BP 123/72    Pulse 73    Temp 98.6 F (37 C) (Temporal)    Ht 5' 3"  (1.6 m)    Wt 200 lb (90.7 kg)    LMP 09/07/2016 (Exact Date)    BMI 35.43 kg/m   BP Readings from Last 3 Encounters:  10/19/19 123/72  11/18/18 (!) 160/68  07/03/18 (!) 167/84    Wt Readings from Last 3 Encounters:  10/19/19 200 lb (90.7 kg)  07/03/18 198 lb (89.8 kg)  02/11/18 200 lb 4 oz (90.8 kg)     Physical Exam Constitutional:      General: She is in acute distress ( distraught).       Appearance: She is well-developed.  HENT:     Head: Normocephalic and atraumatic.  Neck:     Thyroid: No thyromegaly.  Cardiovascular:     Rate and Rhythm: Normal rate and regular rhythm.     Heart sounds: Murmur present. Systolic murmur present with a grade of 2/6.  Pulmonary:     Effort: Pulmonary effort is normal.     Breath sounds: Normal breath sounds. No wheezing.  Abdominal:     Palpations: Abdomen is soft.  Musculoskeletal:        General: Normal range of motion.     Cervical back: Normal range of motion and neck supple.  Lymphadenopathy:     Cervical: No cervical adenopathy.  Skin:    General: Skin is warm and dry.  Neurological:     Mental Status: She is alert and oriented to person, place, and time.  Psychiatric:        Behavior: Behavior normal.       Assessment & Plan:   Ann Peterson was seen today for trouble sleeping.  Diagnoses and all orders for this visit:  Grief -     clonazePAM (KLONOPIN) 0.5 MG disintegrating tablet; Take 1 tablet (0.5 mg total) by mouth 2 (two) times daily as needed (anxiety and bereavement).  Hypothyroidism, unspecified type -     Cancel:  TSH + free T4 -     Cancel: CBC with Differential/Platelet -     Cancel: CMP14+EGFR -     TSH + free T4  Essential hypertension -     CBC with Differential/Platelet -     CMP14+EGFR -     TSH + free T4 -     Lipid panel  Anxiety reaction -     clonazePAM (KLONOPIN) 0.5 MG disintegrating tablet; Take 1 tablet (0.5 mg total) by mouth 2 (two) times daily as needed (anxiety and bereavement).   Patient he with major stress and needs some temporary intervention to help her deal with the anxieties related to that.  Is grieving the loss of a loved 1.  Short-term Klonopin seems appropriate at this time.  We will just try to taper her off and if there is a continued need to consider antianxiety such as Lexapro or Cymbalta in the future.  We did discuss bereavement leave up 2 weeks.  Hopefully this  time will help her to cope and be ready to go back to work.    I have discontinued Ann Peterson's ciprofloxacin. I am also having her start on clonazePAM. Additionally, I am having her maintain her Naftifine HCl, levothyroxine, lisinopril, valACYclovir, gabapentin, fluticasone, amLODipine, and metoprolol succinate.  Allergies as of 10/19/2019      Reactions   Pollen Extract       Medication List       Accurate as of October 19, 2019  9:17 PM. If you have any questions, ask your nurse or doctor.        STOP taking these medications   ciprofloxacin 500 MG tablet Commonly known as: Cipro Stopped by: Claretta Fraise, MD     TAKE these medications   amLODipine 10 MG tablet Commonly known as: NORVASC TAKE 1 TABLET BY MOUTH EVERY DAY   clonazePAM 0.5 MG disintegrating tablet Commonly known as: KLONOPIN Take 1 tablet (0.5 mg total) by mouth 2 (two) times daily as needed (anxiety and bereavement). Started by: Claretta Fraise, MD   fluticasone 50 MCG/ACT nasal spray Commonly known as: FLONASE SPRAY 2 SPRAYS INTO EACH NOSTRIL EVERY DAY   gabapentin 100 MG capsule Commonly known as: NEURONTIN Take 1 capsule (100 mg total) by mouth at bedtime.   levothyroxine 175 MCG tablet Commonly known as: SYNTHROID TAKE 1 TABLET (200 MCG TOTAL) BY MOUTH DAILY.   lisinopril 20 MG tablet Commonly known as: ZESTRIL Take 1 tablet (20 mg total) by mouth daily. (Need to be seen)   metoprolol succinate 100 MG 24 hr tablet Commonly known as: TOPROL-XL TAKE 1 TABLET (100 MG TOTAL) BY MOUTH DAILY. TAKE WITH OR IMMEDIATELY FOLLOWING A MEAL.(NEEDS TO BE SEEN BEFORE NEXT REFILL)   Naftifine HCl 1 % Gel Apply 1 application topically 2 (two) times daily.   valACYclovir 1000 MG tablet Commonly known as: VALTREX Take 1 tablet (1,000 mg total) by mouth 2 (two) times daily.        Follow-up: Return in about 2 weeks (around 11/02/2019).  Claretta Fraise, M.D.

## 2019-10-20 ENCOUNTER — Other Ambulatory Visit: Payer: Self-pay | Admitting: Family Medicine

## 2019-10-20 ENCOUNTER — Other Ambulatory Visit: Payer: Self-pay | Admitting: *Deleted

## 2019-10-20 DIAGNOSIS — E039 Hypothyroidism, unspecified: Secondary | ICD-10-CM

## 2019-10-20 LAB — CMP14+EGFR
ALT: 25 IU/L (ref 0–32)
AST: 14 IU/L (ref 0–40)
Albumin/Globulin Ratio: 2 (ref 1.2–2.2)
Albumin: 4.4 g/dL (ref 3.8–4.9)
Alkaline Phosphatase: 86 IU/L (ref 39–117)
BUN/Creatinine Ratio: 16 (ref 9–23)
BUN: 11 mg/dL (ref 6–24)
Bilirubin Total: <0.2 mg/dL (ref 0.0–1.2)
CO2: 27 mmol/L (ref 20–29)
Calcium: 9.1 mg/dL (ref 8.7–10.2)
Chloride: 104 mmol/L (ref 96–106)
Creatinine, Ser: 0.68 mg/dL (ref 0.57–1.00)
GFR calc Af Amer: 116 mL/min/{1.73_m2} (ref >59)
GFR calc non Af Amer: 101 mL/min/{1.73_m2} (ref >59)
Globulin, Total: 2.2 g/dL (ref 1.5–4.5)
Glucose: 138 mg/dL — ABNORMAL HIGH (ref 65–99)
Potassium: 4.8 mmol/L (ref 3.5–5.2)
Sodium: 143 mmol/L (ref 134–144)
Total Protein: 6.6 g/dL (ref 6.0–8.5)

## 2019-10-20 LAB — CBC WITH DIFFERENTIAL/PLATELET
Basophils Absolute: 0.1 10*3/uL (ref 0.0–0.2)
Basos: 1 %
EOS (ABSOLUTE): 0.1 10*3/uL (ref 0.0–0.4)
Eos: 2 %
Hematocrit: 40 % (ref 34.0–46.6)
Hemoglobin: 13.9 g/dL (ref 11.1–15.9)
Immature Grans (Abs): 0 10*3/uL (ref 0.0–0.1)
Immature Granulocytes: 1 %
Lymphocytes Absolute: 1.7 10*3/uL (ref 0.7–3.1)
Lymphs: 28 %
MCH: 30.8 pg (ref 26.6–33.0)
MCHC: 34.8 g/dL (ref 31.5–35.7)
MCV: 89 fL (ref 79–97)
Monocytes Absolute: 0.5 10*3/uL (ref 0.1–0.9)
Monocytes: 8 %
Neutrophils Absolute: 3.5 10*3/uL (ref 1.4–7.0)
Neutrophils: 60 %
Platelets: 255 10*3/uL (ref 150–450)
RBC: 4.52 x10E6/uL (ref 3.77–5.28)
RDW: 11.7 % (ref 11.7–15.4)
WBC: 5.9 10*3/uL (ref 3.4–10.8)

## 2019-10-20 LAB — LIPID PANEL
Chol/HDL Ratio: 4.3 ratio (ref 0.0–4.4)
Cholesterol, Total: 200 mg/dL — ABNORMAL HIGH (ref 100–199)
HDL: 47 mg/dL (ref >39)
LDL Chol Calc (NIH): 124 mg/dL — ABNORMAL HIGH (ref 0–99)
Triglycerides: 161 mg/dL — ABNORMAL HIGH (ref 0–149)
VLDL Cholesterol Cal: 29 mg/dL (ref 5–40)

## 2019-10-20 LAB — TSH+FREE T4
Free T4: 2.29 ng/dL — ABNORMAL HIGH (ref 0.82–1.77)
TSH: 0.063 u[IU]/mL — ABNORMAL LOW (ref 0.450–4.500)

## 2019-10-20 MED ORDER — LEVOTHYROXINE SODIUM 175 MCG PO TABS: ORAL_TABLET | ORAL | 0 refills | Status: DC

## 2019-10-22 ENCOUNTER — Telehealth: Payer: Self-pay | Admitting: Family Medicine

## 2019-10-22 NOTE — Telephone Encounter (Signed)
Aware of results. 

## 2019-10-25 ENCOUNTER — Other Ambulatory Visit: Payer: Self-pay | Admitting: Family Medicine

## 2019-10-25 DIAGNOSIS — I10 Essential (primary) hypertension: Secondary | ICD-10-CM

## 2019-10-27 DIAGNOSIS — G4733 Obstructive sleep apnea (adult) (pediatric): Secondary | ICD-10-CM | POA: Diagnosis not present

## 2019-11-02 ENCOUNTER — Ambulatory Visit: Payer: BC Managed Care – PPO | Admitting: Family Medicine

## 2019-11-19 ENCOUNTER — Other Ambulatory Visit: Payer: Self-pay | Admitting: Family Medicine

## 2019-11-24 DIAGNOSIS — G4733 Obstructive sleep apnea (adult) (pediatric): Secondary | ICD-10-CM | POA: Diagnosis not present

## 2019-11-30 ENCOUNTER — Ambulatory Visit (INDEPENDENT_AMBULATORY_CARE_PROVIDER_SITE_OTHER): Payer: BC Managed Care – PPO

## 2019-11-30 ENCOUNTER — Ambulatory Visit: Payer: BC Managed Care – PPO | Admitting: Family Medicine

## 2019-11-30 ENCOUNTER — Other Ambulatory Visit: Payer: Self-pay

## 2019-11-30 VITALS — BP 130/78 | HR 78 | Temp 99.1°F | Ht 63.0 in | Wt 201.0 lb

## 2019-11-30 DIAGNOSIS — R109 Unspecified abdominal pain: Secondary | ICD-10-CM | POA: Diagnosis not present

## 2019-11-30 DIAGNOSIS — N200 Calculus of kidney: Secondary | ICD-10-CM | POA: Diagnosis not present

## 2019-11-30 LAB — MICROSCOPIC EXAMINATION: Renal Epithel, UA: NONE SEEN /hpf

## 2019-11-30 LAB — URINALYSIS, COMPLETE
Bilirubin, UA: NEGATIVE
Glucose, UA: NEGATIVE
Ketones, UA: NEGATIVE
Nitrite, UA: NEGATIVE
Protein,UA: NEGATIVE
Specific Gravity, UA: 1.02 (ref 1.005–1.030)
Urobilinogen, Ur: 0.2 mg/dL (ref 0.2–1.0)
pH, UA: 7 (ref 5.0–7.5)

## 2019-11-30 IMAGING — DX DG ABDOMEN 1V
2 series · 2 of 2 positions shown · non-contrast
Comparison: [DATE]

CLINICAL DATA: LEFT flank pain radiating to groin, history of
kidney stones

EXAM:
ABDOMEN - 1 VIEW

[abdomen kub (1 of 2)]
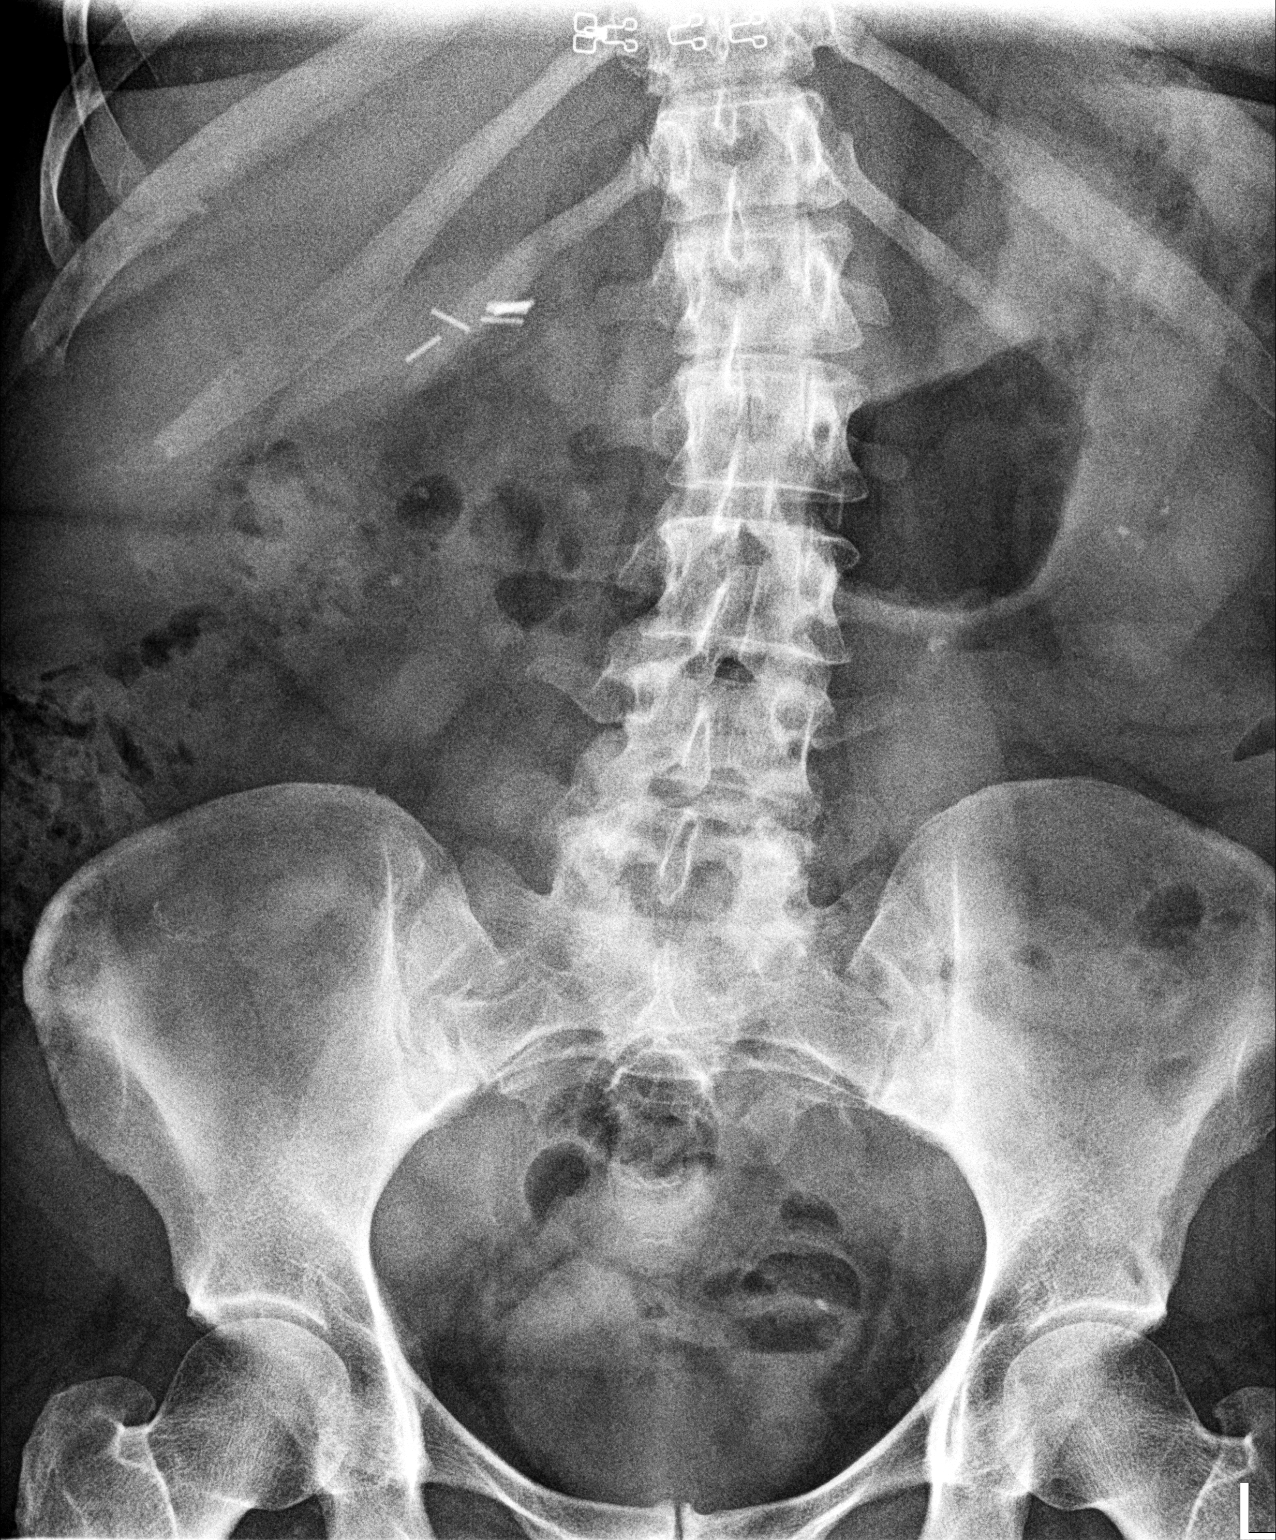

[abdomen kub (2 of 2)]
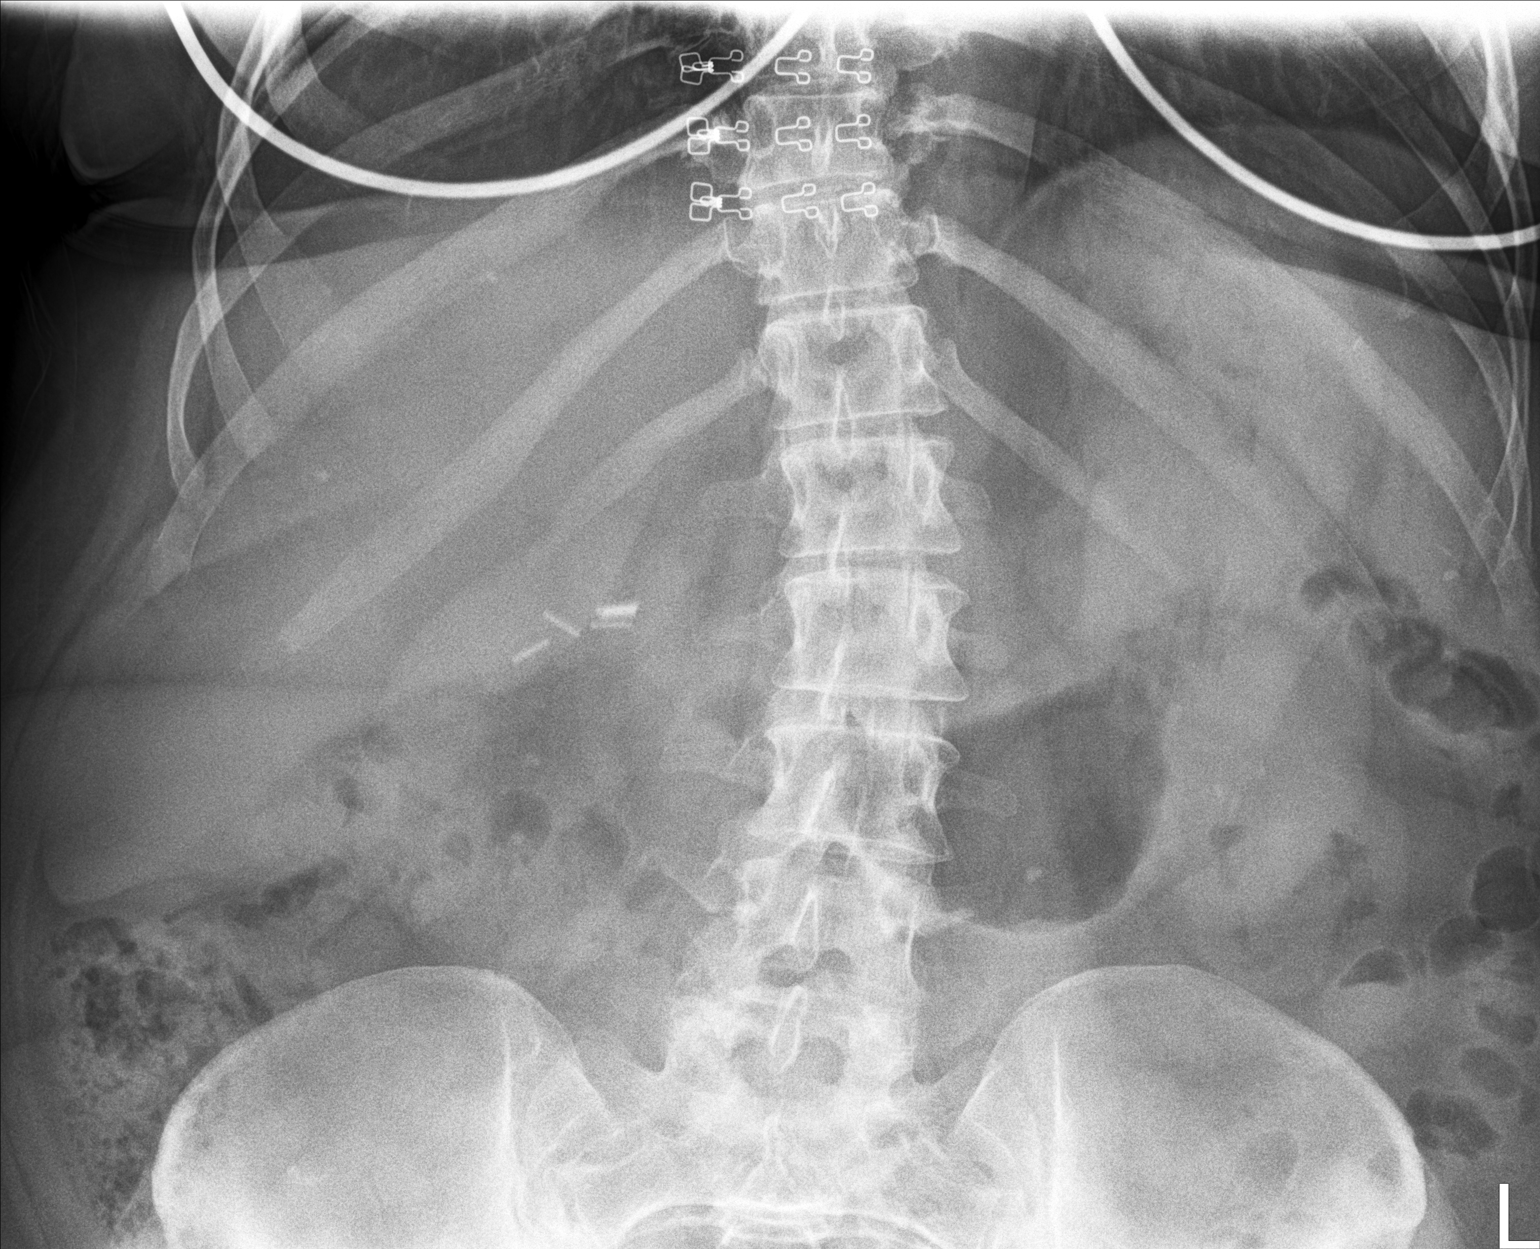

[2 of 2 positions shown; findings below may reference images not displayed]

FINDINGS: Multiple tiny BILATERAL renal calculi up to 4 mm RIGHT and 3 mm
LEFT.

In addition, 5 x 3 mm calculus at approximately the LEFT
ureteropelvic junction, new.

No additional potential urinary tract calcifications.

Small LEFT pelvic phleboliths stable.

Levoconvex thoracolumbar scoliosis.

Bowel gas pattern normal.
IMPRESSION: Multiple BILATERAL renal calculi within additional 5 x 3 mm calculus
projecting over the LEFT ureteropelvic junction region.

## 2019-11-30 MED ORDER — TAMSULOSIN HCL 0.4 MG PO CAPS: 0.4000 mg | ORAL_CAPSULE | Freq: Every day | ORAL | 0 refills | Status: DC

## 2019-11-30 MED ORDER — CEPHALEXIN 500 MG PO CAPS: 500.0000 mg | ORAL_CAPSULE | Freq: Four times a day (QID) | ORAL | 0 refills | Status: DC

## 2019-11-30 MED ORDER — CEFTRIAXONE SODIUM 1 G IJ SOLR
1.0000 g | Freq: Once | INTRAMUSCULAR | Status: AC
  Administered 2019-11-30: 1 g via INTRAMUSCULAR

## 2019-11-30 MED ORDER — METHYLPREDNISOLONE ACETATE 40 MG/ML IJ SUSP
40.0000 mg | Freq: Once | INTRAMUSCULAR | Status: AC
  Administered 2019-11-30: 40 mg via INTRAMUSCULAR

## 2019-11-30 NOTE — Patient Instructions (Addendum)
Your urinalysis shows many bacteria and some red blood cells as well as white blood cells.  This can be suggestive of infection and because you have had some flank pain I am going to treat you like it is a kidney infection.  You got a dose of Rocephin here today.  You can start the oral antibiotic tomorrow.  You are also given a dose of Depo-Medrol for pain relief today.  If symptoms worsen, you develop nausea, vomiting, fevers, please seek immediate medical attention. Pyelonephritis, Adult  Pyelonephritis is an infection that occurs in the kidney. The kidneys are organs that help clean the blood by moving waste out of the blood and into the pee (urine). This infection can happen quickly, or it can last for a long time. In most cases, it clears up with treatment and does not cause other problems. What are the causes? This condition may be caused by:  Germs (bacteria) going from the bladder up to the kidney. This may happen after having a bladder infection.  Germs going from the blood to the kidney. What increases the risk? This condition is more likely to develop in:  Pregnant women.  Older people.  People who have any of these conditions: ? Diabetes. ? Inflammation of the prostate gland (prostatitis), in males. ? Kidney stones or bladder stones. ? Other problems with the kidney or the parts of your body that carry pee from the kidneys to the bladder (ureters). ? Cancer.  People who have a small, thin tube (catheter) placed in the bladder.  People who are sexually active.  Women who use a medicine that kills sperm (spermicide) to prevent pregnancy.  People who have had a prior urinary tract infection (UTI). What are the signs or symptoms? Symptoms of this condition include:  Peeing often.  A strong urge to pee right away.  Burning or stinging when peeing.  Belly pain.  Back pain.  Pain in the side (flank area).  Fever or chills.  Blood in the pee, or dark  pee.  Feeling sick to your stomach (nauseous) or throwing up (vomiting). How is this treated? This condition may be treated by:  Taking antibiotic medicines by mouth (orally).  Drinking enough fluids. If the infection is bad, you may need to stay in the hospital. You may be given antibiotics and fluids that are put directly into a vein through an IV tube. In some cases, other treatments may be needed. Follow these instructions at home: Medicines  Take your antibiotic medicine as told by your doctor. Do not stop taking the antibiotic even if you start to feel better.  Take over-the-counter and prescription medicines only as told by your doctor. General instructions   Drink enough fluid to keep your pee pale yellow.  Avoid caffeine, tea, and carbonated drinks.  Pee (urinate) often. Avoid holding in pee for long periods of time.  Pee before and after sex.  After pooping (having a bowel movement), women should wipe from front to back. Use each tissue only once.  Keep all follow-up visits as told by your doctor. This is important. Contact a doctor if:  You do not feel better after 2 days.  Your symptoms get worse.  You have a fever. Get help right away if:  You cannot take your medicine or drink fluids as told.  You have chills and shaking.  You throw up.  You have very bad pain in your side or back.  You feel very weak or you pass  out (faint). Summary  Pyelonephritis is an infection that occurs in the kidney.  In most cases, this infection clears up with treatment and does not cause other problems.  Take your antibiotic medicine as told by your doctor. Do not stop taking the antibiotic even if you start to feel better.  Drink enough fluid to keep your pee pale yellow. This information is not intended to replace advice given to you by your health care provider. Make sure you discuss any questions you have with your health care provider. Document Revised:  07/01/2018 Document Reviewed: 07/01/2018 Elsevier Patient Education  2020 [employer].

## 2019-11-30 NOTE — Progress Notes (Signed)
Subjective: CC: left side pain PCP: Claretta Fraise, MD [phone] Ann Peterson is a 54 y.o. female presenting to clinic today for:  1. Side pain Patient reports left lower abdominal pain that started on Saturday.  She notes that the pain radiated to the left lower groin but seems to be getting better each day.  She does describe some left-sided flank pain that started abruptly today.  She feels that this started after twisting a certain position at work.  Denies any dysuria, hematuria, nausea, vomiting, fevers.  She does report history of renal stones.   ROS: Per HPI  Allergies  Allergen Reactions  . Pollen Extract    Past Medical History:  Diagnosis Date  . Hyperlipidemia   . Hypertension   . Thyroid disease    thyroidectomy due to cancer    Current Outpatient Medications:  .  amLODipine (NORVASC) 10 MG tablet, TAKE 1 TABLET BY MOUTH EVERY DAY, Disp: 90 tablet, Rfl: 1 .  clonazePAM (KLONOPIN) 0.5 MG disintegrating tablet, Take 1 tablet (0.5 mg total) by mouth 2 (two) times daily as needed (anxiety and bereavement)., Disp: 30 tablet, Rfl: 0 .  fluticasone (FLONASE) 50 MCG/ACT nasal spray, SPRAY 2 SPRAYS INTO EACH NOSTRIL EVERY DAY, Disp: 48 mL, Rfl: 1 .  gabapentin (NEURONTIN) 100 MG capsule, Take 1 capsule (100 mg total) by mouth at bedtime., Disp: 15 capsule, Rfl: 1 .  levothyroxine (SYNTHROID) 175 MCG tablet, TAKE 1 TABLET (200 MCG TOTAL) BY MOUTH DAILY., Disp: 90 tablet, Rfl: 0 .  lisinopril (ZESTRIL) 20 MG tablet, Take 1 tablet (20 mg total) by mouth daily. (Need to be seen), Disp: 90 tablet, Rfl: 0 .  metoprolol succinate (TOPROL-XL) 100 MG 24 hr tablet, Take 1 tablet (100 mg total) by mouth daily. Take with or immediately following a meal, Disp: 90 tablet, Rfl: 1 .  valACYclovir (VALTREX) 1000 MG tablet, Take 1 tablet (1,000 mg total) by mouth 2 (two) times daily., Disp: 20 tablet, Rfl: 0 Social History   Socioeconomic History  . Marital status: Married    Spouse name: Not on  file  . Number of children: Not on file  . Years of education: Not on file  . Highest education level: Not on file  Occupational History  . Not on file  Tobacco Use  . Smoking status: Never Smoker  . Smokeless tobacco: Never Used  Substance and Sexual Activity  . Alcohol use: No  . Drug use: No  . Sexual activity: Not on file  Other Topics Concern  . Not on file  Social History Narrative  . Not on file   Social Determinants of Health   Financial Resource Strain:   . Difficulty of Paying Living Expenses:   Food Insecurity:   . Worried About Charity fundraiser in the Last Year:   . Arboriculturist in the Last Year:   Transportation Needs:   . Film/video editor (Medical):   Marland Kitchen Lack of Transportation (Non-Medical):   Physical Activity:   . Days of Exercise per Week:   . Minutes of Exercise per Session:   Stress:   . Feeling of Stress :   Social Connections:   . Frequency of Communication with Friends and Family:   . Frequency of Social Gatherings with Friends and Family:   . Attends Religious Services:   . Active Member of Clubs or Organizations:   . Attends Archivist Meetings:   Marland Kitchen Marital Status:   Intimate Partner Violence:   .  Fear of Current or Ex-Partner:   . Emotionally Abused:   Marland Kitchen Physically Abused:   . Sexually Abused:    Family History  Problem Relation Age of Onset  . Hypertension Mother   . Dementia Mother   . Macular degeneration Mother   . Stroke Mother   . COPD Father        lung cancer  . Cancer Brother        colon cancer  . Cancer Brother        brain tumor    Objective: Office vital signs reviewed. BP 130/78   Pulse 78   Temp 99.1 F (37.3 C) (Temporal)   Ht 5\' 3"  (1.6 m)   Wt 201 lb (91.2 kg)   LMP 09/07/2016 (Exact Date)   SpO2 97%   BMI 35.61 kg/m   Physical Examination:  General: Awake, alert, nontoxic-appearing, No acute distress GU: No CVA tenderness palpation.  No suprapubic tenderness palpation  DG Abd  1 View  Result Date: 11/30/2019 CLINICAL DATA:  LEFT flank pain radiating to groin, history of kidney stones EXAM: ABDOMEN - 1 VIEW COMPARISON:  10/05/2013 FINDINGS: Multiple tiny BILATERAL renal calculi up to 4 mm RIGHT and 3 mm LEFT. In addition, 5 x 3 mm calculus at approximately the LEFT ureteropelvic junction, new. No additional potential urinary tract calcifications. Small LEFT pelvic phleboliths stable. Levoconvex thoracolumbar scoliosis. Bowel gas pattern normal. IMPRESSION: Multiple BILATERAL renal calculi within additional 5 x 3 mm calculus projecting over the LEFT ureteropelvic junction region. Electronically Signed   By: Lavonia Dana M.D.   On: 11/30/2019 16:57    Assessment/ Plan: 54 y.o. female   1. Nephrolithiasis Personal review of x-ray demonstrated multiple calcifications suggestive of nephrolithiasis.  Radiologist confirmed these findings and actually noted a 5 x 3 mm calculus over the left ureteropelvic junction.  This is likely the etiology of patient's pain.  I have placed her on Flomax in efforts to see if we can pass this stone.  We discussed the variable possibility of passing the stone given its size.  She was given a dose of Depo-Medrol here in office.  I have placed her on oral Keflex and she was also given a dose of Rocephin given bacteria, red blood cells and white blood cells noted on urinalysis.  This is also been sent for urine culture.  We discussed red flag signs and symptoms warranting further evaluation emergency department.  She voiced understanding.  If no significant improvement with the above therapies, will plan for CT renal stone scan and referral to urology.  2. Left flank pain - DG Abd 1 View; Future - Urinalysis, Complete - methylPREDNISolone acetate (DEPO-MEDROL) injection 40 mg - Urine Culture - cefTRIAXone (ROCEPHIN) injection 1 g - cephALEXin (KEFLEX) 500 MG capsule; Take 1 capsule (500 mg total) by mouth 4 (four) times daily for 7 days.  Dispense: 28  capsule; Refill: 0 - tamsulosin (FLOMAX) 0.4 MG CAPS capsule; Take 1 capsule (0.4 mg total) by mouth daily. x7 days  Dispense: 30 capsule; Refill: 0   No orders of the defined types were placed in this encounter.  No orders of the defined types were placed in this encounter.    Janora Norlander, DO [place] [phone]

## 2019-12-01 ENCOUNTER — Other Ambulatory Visit: Payer: Self-pay | Admitting: Family Medicine

## 2019-12-01 ENCOUNTER — Encounter: Payer: Self-pay | Admitting: Family Medicine

## 2019-12-01 DIAGNOSIS — N200 Calculus of kidney: Secondary | ICD-10-CM

## 2019-12-01 MED ORDER — HYDROCODONE-ACETAMINOPHEN 5-325 MG PO TABS: 1.0000 | ORAL_TABLET | Freq: Four times a day (QID) | ORAL | 0 refills | Status: DC | PRN

## 2019-12-02 ENCOUNTER — Telehealth: Payer: Self-pay | Admitting: Family Medicine

## 2019-12-02 LAB — URINE CULTURE: Organism ID, Bacteria: NO GROWTH

## 2019-12-03 ENCOUNTER — Inpatient Hospital Stay (HOSPITAL_COMMUNITY): Payer: BC Managed Care – PPO | Admitting: Certified Registered Nurse Anesthetist

## 2019-12-03 ENCOUNTER — Encounter (HOSPITAL_COMMUNITY)
Admission: AD | Disposition: A | Discharge status: Home / Self Care | Payer: Self-pay | Source: Ambulatory Visit | Attending: Urology

## 2019-12-03 ENCOUNTER — Other Ambulatory Visit: Payer: Self-pay | Admitting: Urology

## 2019-12-03 ENCOUNTER — Inpatient Hospital Stay (HOSPITAL_COMMUNITY): Payer: BC Managed Care – PPO

## 2019-12-03 ENCOUNTER — Encounter (HOSPITAL_COMMUNITY): Payer: Self-pay | Admitting: Urology

## 2019-12-03 ENCOUNTER — Other Ambulatory Visit: Payer: Self-pay

## 2019-12-03 ENCOUNTER — Observation Stay (HOSPITAL_COMMUNITY)
Admission: AD | Admit: 2019-12-03 | Discharge: 2019-12-05 | Disposition: A | Discharge status: Home / Self Care | Payer: BC Managed Care – PPO | Source: Ambulatory Visit | Attending: Urology | Admitting: Urology

## 2019-12-03 DIAGNOSIS — R1111 Vomiting without nausea: Secondary | ICD-10-CM | POA: Diagnosis not present

## 2019-12-03 DIAGNOSIS — Z8616 Personal history of COVID-19: Secondary | ICD-10-CM | POA: Insufficient documentation

## 2019-12-03 DIAGNOSIS — Z87442 Personal history of urinary calculi: Secondary | ICD-10-CM | POA: Insufficient documentation

## 2019-12-03 DIAGNOSIS — A419 Sepsis, unspecified organism: Secondary | ICD-10-CM | POA: Diagnosis not present

## 2019-12-03 DIAGNOSIS — I10 Essential (primary) hypertension: Secondary | ICD-10-CM | POA: Diagnosis not present

## 2019-12-03 DIAGNOSIS — E039 Hypothyroidism, unspecified: Secondary | ICD-10-CM | POA: Diagnosis not present

## 2019-12-03 DIAGNOSIS — U071 COVID-19: Secondary | ICD-10-CM

## 2019-12-03 DIAGNOSIS — N132 Hydronephrosis with renal and ureteral calculous obstruction: Secondary | ICD-10-CM | POA: Diagnosis not present

## 2019-12-03 DIAGNOSIS — R300 Dysuria: Secondary | ICD-10-CM | POA: Diagnosis not present

## 2019-12-03 DIAGNOSIS — Z85850 Personal history of malignant neoplasm of thyroid: Secondary | ICD-10-CM | POA: Insufficient documentation

## 2019-12-03 DIAGNOSIS — E890 Postprocedural hypothyroidism: Secondary | ICD-10-CM | POA: Diagnosis not present

## 2019-12-03 DIAGNOSIS — N201 Calculus of ureter: Secondary | ICD-10-CM | POA: Diagnosis not present

## 2019-12-03 HISTORY — PX: CYSTOSCOPY W/ URETERAL STENT PLACEMENT: SHX1429

## 2019-12-03 HISTORY — DX: COVID-19: U07.1

## 2019-12-03 LAB — BASIC METABOLIC PANEL
Anion gap: 11 (ref 5–15)
BUN: 20 mg/dL (ref 6–20)
CO2: 25 mmol/L (ref 22–32)
Calcium: 9.2 mg/dL (ref 8.9–10.3)
Chloride: 102 mmol/L (ref 98–111)
Creatinine, Ser: 0.58 mg/dL (ref 0.44–1.00)
GFR calc Af Amer: >60 mL/min (ref >60)
GFR calc non Af Amer: >60 mL/min (ref >60)
Glucose, Bld: 96 mg/dL (ref 70–99)
Potassium: 4.1 mmol/L (ref 3.5–5.1)
Sodium: 138 mmol/L (ref 135–145)

## 2019-12-03 LAB — CBC
HCT: 41.5 % (ref 36.0–46.0)
Hemoglobin: 13.9 g/dL (ref 12.0–15.0)
MCH: 31 pg (ref 26.0–34.0)
MCHC: 33.5 g/dL (ref 30.0–36.0)
MCV: 92.6 fL (ref 80.0–100.0)
Platelets: 207 10*3/uL (ref 150–400)
RBC: 4.48 MIL/uL (ref 3.87–5.11)
RDW: 12.5 % (ref 11.5–15.5)
WBC: 8.6 10*3/uL (ref 4.0–10.5)
nRBC: 0 % (ref 0.0–0.2)

## 2019-12-03 LAB — RESPIRATORY PANEL BY RT PCR (FLU A&B, COVID)
Influenza A by PCR: NEGATIVE
Influenza B by PCR: NEGATIVE
SARS Coronavirus 2 by RT PCR: POSITIVE — AB

## 2019-12-03 SURGERY — CYSTOSCOPY, FLEXIBLE, WITH STENT REPLACEMENT
Anesthesia: General

## 2019-12-03 MED ORDER — LEVOTHYROXINE SODIUM 75 MCG PO TABS
175.0000 ug | ORAL_TABLET | Freq: Every day | ORAL | Status: DC
  Administered 2019-12-04 – 2019-12-05 (×2): 175 ug via ORAL
  Filled 2019-12-03 (×2): qty 1

## 2019-12-03 MED ORDER — SODIUM CHLORIDE 0.9 % IR SOLN
Status: DC | PRN
  Administered 2019-12-03: 1000 mL

## 2019-12-03 MED ORDER — FLUCONAZOLE 150 MG PO TABS: 150.0000 mg | ORAL_TABLET | Freq: Once | ORAL | 0 refills | Status: AC

## 2019-12-03 MED ORDER — METOPROLOL SUCCINATE ER 100 MG PO TB24
100.0000 mg | ORAL_TABLET | Freq: Every day | ORAL | Status: DC
  Administered 2019-12-03 – 2019-12-05 (×3): 100 mg via ORAL
  Filled 2019-12-03 (×3): qty 1

## 2019-12-03 MED ORDER — MIDAZOLAM HCL 5 MG/5ML IJ SOLN
INTRAMUSCULAR | Status: DC | PRN
  Administered 2019-12-03: 2 mg via INTRAVENOUS

## 2019-12-03 MED ORDER — OXYCODONE-ACETAMINOPHEN 5-325 MG PO TABS
1.0000 | ORAL_TABLET | ORAL | Status: DC | PRN
  Administered 2019-12-03 – 2019-12-05 (×8): 2 via ORAL
  Filled 2019-12-03 (×9): qty 2

## 2019-12-03 MED ORDER — HYDROMORPHONE HCL 1 MG/ML IJ SOLN
0.2500 mg | INTRAMUSCULAR | Status: DC | PRN
  Administered 2019-12-03: 0.5 mg via INTRAVENOUS

## 2019-12-03 MED ORDER — OXYCODONE HCL 5 MG/5ML PO SOLN: 5.0000 mg | Freq: Once | ORAL | Status: DC | PRN

## 2019-12-03 MED ORDER — ONDANSETRON HCL 4 MG/2ML IJ SOLN
INTRAMUSCULAR | Status: DC | PRN
  Administered 2019-12-03: 4 mg via INTRAVENOUS

## 2019-12-03 MED ORDER — PROPOFOL 10 MG/ML IV BOLUS
INTRAVENOUS | Status: DC | PRN
  Administered 2019-12-03: 180 mg via INTRAVENOUS

## 2019-12-03 MED ORDER — LIDOCAINE 2% (20 MG/ML) 5 ML SYRINGE
INTRAMUSCULAR | Status: DC | PRN
  Administered 2019-12-03: 80 mg via INTRAVENOUS

## 2019-12-03 MED ORDER — SODIUM CHLORIDE 0.9 % IV SOLN
2.0000 g | INTRAVENOUS | Status: DC
  Administered 2019-12-04 – 2019-12-05 (×2): 2 g via INTRAVENOUS
  Filled 2019-12-03 (×2): qty 2

## 2019-12-03 MED ORDER — PROMETHAZINE HCL 25 MG/ML IJ SOLN: 6.2500 mg | INTRAMUSCULAR | Status: DC | PRN

## 2019-12-03 MED ORDER — ONDANSETRON HCL 4 MG/2ML IJ SOLN: 4.0000 mg | INTRAMUSCULAR | Status: DC | PRN

## 2019-12-03 MED ORDER — OXYCODONE HCL 5 MG PO TABS: 5.0000 mg | ORAL_TABLET | Freq: Once | ORAL | Status: DC | PRN

## 2019-12-03 MED ORDER — SODIUM CHLORIDE 0.9 % IR SOLN
Status: DC | PRN
  Administered 2019-12-03: 3000 mL

## 2019-12-03 MED ORDER — LACTATED RINGERS IV SOLN: INTRAVENOUS | Status: DC

## 2019-12-03 MED ORDER — AMLODIPINE BESYLATE 10 MG PO TABS
10.0000 mg | ORAL_TABLET | Freq: Every day | ORAL | Status: DC
  Administered 2019-12-03 – 2019-12-05 (×3): 10 mg via ORAL
  Filled 2019-12-03 (×3): qty 1

## 2019-12-03 MED ORDER — SODIUM CHLORIDE 0.9 % IV SOLN: INTRAVENOUS | Status: DC

## 2019-12-03 MED ORDER — MIDAZOLAM HCL 2 MG/2ML IJ SOLN
INTRAMUSCULAR | Status: AC
  Filled 2019-12-03: qty 2

## 2019-12-03 MED ORDER — DIPHENHYDRAMINE HCL 50 MG/ML IJ SOLN: 12.5000 mg | Freq: Four times a day (QID) | INTRAMUSCULAR | Status: DC | PRN

## 2019-12-03 MED ORDER — ZOLPIDEM TARTRATE 5 MG PO TABS
5.0000 mg | ORAL_TABLET | Freq: Every evening | ORAL | Status: DC | PRN
  Filled 2019-12-03: qty 1

## 2019-12-03 MED ORDER — TAMSULOSIN HCL 0.4 MG PO CAPS
0.4000 mg | ORAL_CAPSULE | Freq: Every day | ORAL | Status: DC
  Administered 2019-12-03 – 2019-12-05 (×3): 0.4 mg via ORAL
  Filled 2019-12-03 (×3): qty 1

## 2019-12-03 MED ORDER — FENTANYL CITRATE (PF) 100 MCG/2ML IJ SOLN
INTRAMUSCULAR | Status: DC | PRN
  Administered 2019-12-03 (×2): 50 ug via INTRAVENOUS

## 2019-12-03 MED ORDER — STERILE WATER FOR IRRIGATION IR SOLN
Status: DC | PRN
  Administered 2019-12-03: 10 mL

## 2019-12-03 MED ORDER — HYDROMORPHONE HCL 1 MG/ML IJ SOLN
INTRAMUSCULAR | Status: AC
  Filled 2019-12-03: qty 1

## 2019-12-03 MED ORDER — HYDROMORPHONE HCL 1 MG/ML IJ SOLN
0.5000 mg | INTRAMUSCULAR | Status: DC | PRN
  Administered 2019-12-03 – 2019-12-05 (×6): 1 mg via INTRAVENOUS
  Filled 2019-12-03 (×7): qty 1

## 2019-12-03 MED ORDER — SODIUM CHLORIDE 0.9 % IV SOLN
2.0000 g | INTRAVENOUS | Status: AC
  Administered 2019-12-03: 2 g via INTRAVENOUS
  Filled 2019-12-03: qty 20

## 2019-12-03 MED ORDER — FENTANYL CITRATE (PF) 100 MCG/2ML IJ SOLN
INTRAMUSCULAR | Status: AC
  Filled 2019-12-03: qty 2

## 2019-12-03 MED ORDER — HYOSCYAMINE SULFATE 0.125 MG SL SUBL
0.1250 mg | SUBLINGUAL_TABLET | SUBLINGUAL | Status: DC | PRN
  Administered 2019-12-05: 0.125 mg via SUBLINGUAL
  Filled 2019-12-03 (×3): qty 1

## 2019-12-03 MED ORDER — FLUTICASONE PROPIONATE 50 MCG/ACT NA SUSP
1.0000 | Freq: Every evening | NASAL | Status: DC
  Administered 2019-12-03 – 2019-12-04 (×2): 1 via NASAL
  Filled 2019-12-03: qty 16

## 2019-12-03 MED ORDER — IOHEXOL 300 MG/ML  SOLN
INTRAMUSCULAR | Status: DC | PRN
  Administered 2019-12-03: 3 mL

## 2019-12-03 MED ORDER — DEXAMETHASONE SODIUM PHOSPHATE 10 MG/ML IJ SOLN
INTRAMUSCULAR | Status: DC | PRN
  Administered 2019-12-03: 10 mg via INTRAVENOUS

## 2019-12-03 MED ORDER — LISINOPRIL 20 MG PO TABS
20.0000 mg | ORAL_TABLET | Freq: Every day | ORAL | Status: DC
  Administered 2019-12-03 – 2019-12-05 (×3): 20 mg via ORAL
  Filled 2019-12-03 (×3): qty 1

## 2019-12-03 MED ORDER — DIPHENHYDRAMINE HCL 12.5 MG/5ML PO ELIX: 12.5000 mg | ORAL_SOLUTION | Freq: Four times a day (QID) | ORAL | Status: DC | PRN

## 2019-12-03 SURGICAL SUPPLY — 24 items
BAG URINE DRAIN 2000ML AR STRL (UROLOGICAL SUPPLIES) ×2 IMPLANT
BAG URO CATCHER STRL LF (MISCELLANEOUS) ×3 IMPLANT
CATH FOLEY 2WAY SLVR  5CC 16FR (CATHETERS) ×3
CATH FOLEY 2WAY SLVR 5CC 16FR (CATHETERS) IMPLANT
CATH INTERMIT  6FR 70CM (CATHETERS) ×3 IMPLANT
CLOTH BEACON ORANGE TIMEOUT ST (SAFETY) ×3 IMPLANT
EXTRACTOR STONE NITINOL NGAGE (UROLOGICAL SUPPLIES) IMPLANT
FIBER LASER TRAC TIP (UROLOGICAL SUPPLIES) IMPLANT
GLOVE BIO SURGEON STRL SZ8 (GLOVE) ×3 IMPLANT
GOWN STRL REUS W/TWL XL LVL3 (GOWN DISPOSABLE) ×3 IMPLANT
GUIDEWIRE ANG ZIPWIRE 038X150 (WIRE) ×3 IMPLANT
GUIDEWIRE STR DUAL SENSOR (WIRE) IMPLANT
IV NS 1000ML (IV SOLUTION) ×3
IV NS 1000ML BAXH (IV SOLUTION) ×1 IMPLANT
KIT TURNOVER KIT A (KITS) IMPLANT
MANIFOLD NEPTUNE II (INSTRUMENTS) ×3 IMPLANT
PACK CYSTO (CUSTOM PROCEDURE TRAY) ×3 IMPLANT
SHEATH URETERAL 12FRX35CM (MISCELLANEOUS) IMPLANT
STENT URET 6FRX24 CONTOUR (STENTS) ×2 IMPLANT
STENT URET 6FRX26 CONTOUR (STENTS) IMPLANT
TUBE FEEDING 8FR 16IN STR KANG (MISCELLANEOUS) IMPLANT
TUBING CONNECTING 10 (TUBING) ×2 IMPLANT
TUBING CONNECTING 10' (TUBING) ×1
TUBING UROLOGY SET (TUBING) ×2 IMPLANT

## 2019-12-03 NOTE — Anesthesia Procedure Notes (Signed)
Procedure Name: LMA Insertion Date/Time: 12/03/2019 4:46 PM Performed by: Montel Clock, CRNA Pre-anesthesia Checklist: Patient identified, Emergency Drugs available, Suction available, Patient being monitored and Timeout performed Patient Re-evaluated:Patient Re-evaluated prior to induction Oxygen Delivery Method: Circle system utilized Preoxygenation: Pre-oxygenation with 100% oxygen Induction Type: IV induction Ventilation: Mask ventilation without difficulty LMA: LMA with gastric port inserted LMA Size: 4.0 Number of attempts: 1 Dental Injury: Teeth and Oropharynx as per pre-operative assessment

## 2019-12-03 NOTE — Anesthesia Preprocedure Evaluation (Signed)
Anesthesia Evaluation  Patient identified by MRN, date of birth, ID band Patient awake    Reviewed: Allergy & Precautions, NPO status , Patient's Chart, lab work & pertinent test results  Airway Mallampati: II  TM Distance: >3 FB Neck ROM: Full    Dental no notable dental hx.    Pulmonary neg pulmonary ROS,    Pulmonary exam normal breath sounds clear to auscultation       Cardiovascular hypertension, Pt. on medications negative cardio ROS Normal cardiovascular exam Rhythm:Regular Rate:Normal     Neuro/Psych negative neurological ROS  negative psych ROS   GI/Hepatic negative GI ROS, Neg liver ROS,   Endo/Other  Hypothyroidism   Renal/GU negative Renal ROS  negative genitourinary   Musculoskeletal negative musculoskeletal ROS (+)   Abdominal (+) + obese,   Peds negative pediatric ROS (+)  Hematology negative hematology ROS (+)   Anesthesia Other Findings   Reproductive/Obstetrics negative OB ROS                             Anesthesia Physical Anesthesia Plan  ASA: II and emergent  Anesthesia Plan: General   Post-op Pain Management:    Induction: Intravenous  PONV Risk Score and Plan: 3 and Ondansetron, Dexamethasone, Midazolam and Treatment may vary due to age or medical condition  Airway Management Planned: LMA  Additional Equipment:   Intra-op Plan:   Post-operative Plan: Extubation in OR  Informed Consent: I have reviewed the patients History and Physical, chart, labs and discussed the procedure including the risks, benefits and alternatives for the proposed anesthesia with the patient or authorized representative who has indicated his/her understanding and acceptance.     Dental advisory given  Plan Discussed with: CRNA  Anesthesia Plan Comments:         Anesthesia Quick Evaluation

## 2019-12-03 NOTE — Op Note (Signed)
.  Preoperative diagnosis: Left ureteral stone, sepsis  Postoperative diagnosis: Same  Procedure: 1 cystoscopy 2. Left retrograde pyelography 3.  Intraoperative fluoroscopy, under one hour, with interpretation 4. Left 6 x 24 JJ stent placement  Attending: Nicolette Bang  Anesthesia: General  Estimated blood loss: None  Drains: Left 6 x 24 JJ ureteral stent without tether, 16 French foley catheter  Specimens: none  Antibiotics: rocephin  Findings: left mid ureteral stone. Mild hydronephrosis. No masses/lesions in the bladder. Ureteral orifices in normal anatomic location.  Indications: Patient is a 54 year old female with a history of left ureteral stone and concern for sepsis.  After discussing treatment options, they decided proceed with left stent placement.  Procedure her in detail: The patient was brought to the operating room and a brief timeout was done to ensure correct patient, correct procedure, correct site.  General anesthesia was administered patient was placed in dorsal lithotomy position.  Their genitalia was then prepped and draped in usual sterile fashion.  A rigid 59 French cystoscope was passed in the urethra and the bladder.  Bladder was inspected free masses or lesions.  the ureteral orifices were in the normal orthotopic locations.  a 6 french ureteral catheter was then instilled into the left ureteral orifice.  a gentle retrograde was obtained and findings noted above.  we then placed a zip wire through the ureteral catheter and advanced up to the renal pelvis.    We then placed a 6 x 24 double-j ureteral stent over the original zip wire.  We then removed the wire and good coil was noted in the the renal pelvis under fluoroscopy and the bladder under direct vision.  A foley catheter was then placed. the bladder was then drained and this concluded the procedure which was well tolerated by patient.  Complications: None  Condition: Stable, extubated, transferred to  PACU  Plan: Patient is to be admitted for IV antibiotics. He will have his stone extraction in 2 weeks.

## 2019-12-03 NOTE — H&P (View-Only) (Signed)
Urology Admission H&P  Chief Complaint: left flank pain  History of Present Illness: Ann Peterson is a 54yo with a hx of nephrolithasis who presented to my office with a 5 day hx of left flank pain and fever to 101. Her last stone event was in 2019. Starting Saturday she had sharp, intermittent, severe no radiating left flank pain. She had associated fever to 101. No LUTs. She presented to my office today and underwent CT stone study which showed a 53mm mid ureteral calculus with mild hydronephrosis. No other associated symptoms. No other exacerbating/alleviaitng events  Past Medical History:  Diagnosis Date  . Hyperlipidemia   . Hypertension   . Thyroid disease    thyroidectomy due to cancer   Past Surgical History:  Procedure Laterality Date  . ABDOMINAL HYSTERECTOMY  09/12/2016  . TOTAL THYROIDECTOMY      Home Medications:  Current Facility-Administered Medications  Medication Dose Route Frequency Provider Last Rate Last Admin  . cefTRIAXone (ROCEPHIN) 2 g in sodium chloride 0.9 % 100 mL IVPB  2 g Intravenous 30 min Pre-Op Jalani Rominger, Candee Furbish, MD      . lactated ringers infusion   Intravenous Continuous Lynda Rainwater, MD 20 mL/hr at 12/03/19 1440 New Bag at 12/03/19 1440   Allergies:  Allergies  Allergen Reactions  . Pollen Extract     Family History  Problem Relation Age of Onset  . Hypertension Mother   . Dementia Mother   . Macular degeneration Mother   . Stroke Mother   . COPD Father        lung cancer  . Cancer Brother        colon cancer  . Cancer Brother        brain tumor   Social History:  reports that she has never smoked. She has never used smokeless tobacco. She reports that she does not drink alcohol or use drugs.  Review of Systems  Constitutional: Positive for fever.  Genitourinary: Positive for flank pain.  All other systems reviewed and are negative.   Physical Exam:  Vital signs in last 24 hours: Temp:  [98.2 F (36.8 C)] 98.2 F (36.8 C)  (03/25 1432) Pulse Rate:  [67] 67 (03/25 1432) Resp:  [16] 16 (03/25 1432) BP: (128)/(67) 128/67 (03/25 1432) SpO2:  [97 %] 97 % (03/25 1432) Weight:  [91.2 kg] 91.2 kg (03/25 1432) Physical Exam  Constitutional: She is oriented to person, place, and time. She appears well-developed and well-nourished.  HENT:  Head: Normocephalic and atraumatic.  Eyes: Pupils are equal, round, and reactive to light. EOM are normal.  Neck: No thyromegaly present.  Cardiovascular: Normal rate and regular rhythm.  Respiratory: Effort normal. No respiratory distress.  GI: Soft. She exhibits no distension.  Musculoskeletal:        General: No edema. Normal range of motion.     Cervical back: Normal range of motion.  Neurological: She is alert and oriented to person, place, and time.  Skin: Skin is warm and dry.  Psychiatric: She has a normal mood and affect. Her behavior is normal. Judgment and thought content normal.    Laboratory Data:  Results for orders placed or performed during the hospital encounter of 12/03/19 (from the past 24 hour(s))  CBC     Status: None   Collection Time: 12/03/19  2:03 PM  Result Value Ref Range   WBC 8.6 4.0 - 10.5 K/uL   RBC 4.48 3.87 - 5.11 MIL/uL   Hemoglobin 13.9 12.0 -  15.0 g/dL   HCT 41.5 36.0 - 46.0 %   MCV 92.6 80.0 - 100.0 fL   MCH 31.0 26.0 - 34.0 pg   MCHC 33.5 30.0 - 36.0 g/dL   RDW 12.5 11.5 - 15.5 %   Platelets 207 150 - 400 K/uL   nRBC 0.0 0.0 - 0.2 %  Basic metabolic panel     Status: None   Collection Time: 12/03/19  2:03 PM  Result Value Ref Range   Sodium 138 135 - 145 mmol/L   Potassium 4.1 3.5 - 5.1 mmol/L   Chloride 102 98 - 111 mmol/L   CO2 25 22 - 32 mmol/L   Glucose, Bld 96 70 - 99 mg/dL   BUN 20 6 - 20 mg/dL   Creatinine, Ser 0.58 0.44 - 1.00 mg/dL   Calcium 9.2 8.9 - 10.3 mg/dL   GFR calc non Af Amer >60 >60 mL/min   GFR calc Af Amer >60 >60 mL/min   Anion gap 11 5 - 15  Respiratory Panel by RT PCR (Flu A&B, Covid) -  Nasopharyngeal Swab     Status: Abnormal   Collection Time: 12/03/19  2:03 PM   Specimen: Nasopharyngeal Swab  Result Value Ref Range   SARS Coronavirus 2 by RT PCR POSITIVE (A) NEGATIVE   Influenza A by PCR NEGATIVE NEGATIVE   Influenza B by PCR NEGATIVE NEGATIVE   Recent Results (from the past 240 hour(s))  Microscopic Examination     Status: Abnormal   Collection Time: 11/30/19  4:55 PM   URINE  Result Value Ref Range Status   WBC, UA 6-10 (A) 0 - 5 /hpf Final   RBC 11-30 (A) 0 - 2 /hpf Final   Epithelial Cells (non renal) 0-10 0 - 10 /hpf Final   Renal Epithel, UA None seen None seen /hpf Final   Bacteria, UA Many (A) None seen/Few Final  Urine Culture     Status: None   Collection Time: 11/30/19  5:05 PM   Specimen: Urine   URINE  Result Value Ref Range Status   Urine Culture, Routine Final report  Final   Organism ID, Bacteria No growth  Final  Respiratory Panel by RT PCR (Flu A&B, Covid) - Nasopharyngeal Swab     Status: Abnormal   Collection Time: 12/03/19  2:03 PM   Specimen: Nasopharyngeal Swab  Result Value Ref Range Status   SARS Coronavirus 2 by RT PCR POSITIVE (A) NEGATIVE Final    Comment: RESULT CALLED TO, READ BACK BY AND VERIFIED WITH: RUNYAN,T. @1555  ON  03.25.2021 BY COHEN,K (NOTE) SARS-CoV-2 target nucleic acids are DETECTED. SARS-CoV-2 RNA is generally detectable in upper respiratory specimens  during the acute phase of infection. Positive results are indicative of the presence of the identified virus, but do not rule out bacterial infection or co-infection with other pathogens not detected by the test. Clinical correlation with patient history and other diagnostic information is necessary to determine patient infection status. The expected result is Negative. Fact Sheet for Patients:  PinkCheek.be Fact Sheet for Healthcare Providers: GravelBags.it This test is not yet approved or cleared by  the [place] FDA and  has been authorized for detection and/or diagnosis of SARS-CoV-2 by FDA under an Emergency Use Authorization (EUA).  This EUA will remain in effect (meaning this test can be u sed) for the duration of  the COVID-19 declaration under Section 564(b)(1) of the Act, 21 U.S.C. section 360bbb-3(b)(1), unless the authorization is terminated or revoked sooner.  Influenza A by PCR NEGATIVE NEGATIVE Final   Influenza B by PCR NEGATIVE NEGATIVE Final    Comment: (NOTE) The Xpert Xpress SARS-CoV-2/FLU/RSV assay is intended as an aid in  the diagnosis of influenza from Nasopharyngeal swab specimens and  should not be used as a sole basis for treatment. Nasal washings and  aspirates are unacceptable for Xpert Xpress SARS-CoV-2/FLU/RSV  testing. Fact Sheet for Patients: PinkCheek.be Fact Sheet for Healthcare Providers: GravelBags.it This test is not yet approved or cleared by the [place] FDA and  has been authorized for detection and/or diagnosis of SARS-CoV-2 by  FDA under an Emergency Use Authorization (EUA). This EUA will remain  in effect (meaning this test can be used) for the duration of the  Covid-19 declaration under Section 564(b)(1) of the Act, 21  U.S.C. section 360bbb-3(b)(1), unless the authorization is  terminated or revoked. Performed at [facility], [place] [address] [place], [place] [address]    Creatinine: Recent Labs    12/03/19 1403  CREATININE 0.58   Baseline Creatinine: 0.5  Impression/Assessment:  54yo with left ureteral calculus, sepsis  Plan:  The risks/benefits/alternatives to left ureteral stent placement was explained to the patient and she understands and wishes to proceed with surgery  Nicolette Bang 12/03/2019, 4:28 PM

## 2019-12-03 NOTE — Transfer of Care (Signed)
Immediate Anesthesia Transfer of Care Note  Patient: Ann Peterson  Procedure(s) Performed: CYSTOSCOPY WITH STENT PLACEMENT, RETROGRADE (N/A )  Patient Location: PACU  Anesthesia Type:General  Level of Consciousness: drowsy and patient cooperative  Airway & Oxygen Therapy: Patient Spontanous Breathing and Patient connected to face mask oxygen  Post-op Assessment: Report given to RN and Post -op Vital signs reviewed and stable  Post vital signs: Reviewed and stable  Last Vitals:  Vitals Value Taken Time  BP 136/79 12/03/19 1724  Temp    Pulse 71 12/03/19 1727  Resp 16 12/03/19 1727  SpO2 100 % 12/03/19 1727  Vitals shown include unvalidated device data.  Last Pain:  Vitals:   12/03/19 1724  TempSrc:   PainSc: (P) 5       Patients Stated Pain Goal: 4 ([id number])  Complications: No apparent anesthesia complications

## 2019-12-03 NOTE — Anesthesia Postprocedure Evaluation (Signed)
Anesthesia Post Note  Patient: Jocelyne Demary  Procedure(s) Performed: CYSTOSCOPY WITH STENT PLACEMENT, RETROGRADE (N/A )     Patient location during evaluation: PACU Anesthesia Type: General Level of consciousness: awake and alert Pain management: pain level controlled Vital Signs Assessment: post-procedure vital signs reviewed and stable Respiratory status: spontaneous breathing, nonlabored ventilation and respiratory function stable Cardiovascular status: blood pressure returned to baseline and stable Postop Assessment: no apparent nausea or vomiting Anesthetic complications: no    Last Vitals:  Vitals:   12/03/19 1745 12/03/19 1800  BP: 131/74 123/69  Pulse: 75 75  Resp: 16 16  Temp:    SpO2: 98% 99%    Last Pain:  Vitals:   12/03/19 1745  TempSrc:   PainSc: Asleep                 Lynda Rainwater

## 2019-12-03 NOTE — H&P (Signed)
Urology Admission H&P  Chief Complaint: left flank pain  History of Present Illness: Ann Peterson is a 54yo with a hx of nephrolithasis who presented to my office with a 5 day hx of left flank pain and fever to 101. Her last stone event was in 2019. Starting Saturday she had sharp, intermittent, severe no radiating left flank pain. She had associated fever to 101. No LUTs. She presented to my office today and underwent CT stone study which showed a 31mm mid ureteral calculus with mild hydronephrosis. No other associated symptoms. No other exacerbating/alleviaitng events  Past Medical History:  Diagnosis Date  . Hyperlipidemia   . Hypertension   . Thyroid disease    thyroidectomy due to cancer   Past Surgical History:  Procedure Laterality Date  . ABDOMINAL HYSTERECTOMY  09/12/2016  . TOTAL THYROIDECTOMY      Home Medications:  Current Facility-Administered Medications  Medication Dose Route Frequency Provider Last Rate Last Admin  . cefTRIAXone (ROCEPHIN) 2 g in sodium chloride 0.9 % 100 mL IVPB  2 g Intravenous 30 min Pre-Op Nettie Cromwell, Candee Furbish, MD      . lactated ringers infusion   Intravenous Continuous Lynda Rainwater, MD 20 mL/hr at 12/03/19 1440 New Bag at 12/03/19 1440   Allergies:  Allergies  Allergen Reactions  . Pollen Extract     Family History  Problem Relation Age of Onset  . Hypertension Mother   . Dementia Mother   . Macular degeneration Mother   . Stroke Mother   . COPD Father        lung cancer  . Cancer Brother        colon cancer  . Cancer Brother        brain tumor   Social History:  reports that she has never smoked. She has never used smokeless tobacco. She reports that she does not drink alcohol or use drugs.  Review of Systems  Constitutional: Positive for fever.  Genitourinary: Positive for flank pain.  All other systems reviewed and are negative.   Physical Exam:  Vital signs in last 24 hours: Temp:  [98.2 F (36.8 C)] 98.2 F (36.8 C)  (03/25 1432) Pulse Rate:  [67] 67 (03/25 1432) Resp:  [16] 16 (03/25 1432) BP: (128)/(67) 128/67 (03/25 1432) SpO2:  [97 %] 97 % (03/25 1432) Weight:  [91.2 kg] 91.2 kg (03/25 1432) Physical Exam  Constitutional: She is oriented to person, place, and time. She appears well-developed and well-nourished.  HENT:  Head: Normocephalic and atraumatic.  Eyes: Pupils are equal, round, and reactive to light. EOM are normal.  Neck: No thyromegaly present.  Cardiovascular: Normal rate and regular rhythm.  Respiratory: Effort normal. No respiratory distress.  GI: Soft. She exhibits no distension.  Musculoskeletal:        General: No edema. Normal range of motion.     Cervical back: Normal range of motion.  Neurological: She is alert and oriented to person, place, and time.  Skin: Skin is warm and dry.  Psychiatric: She has a normal mood and affect. Her behavior is normal. Judgment and thought content normal.    Laboratory Data:  Results for orders placed or performed during the hospital encounter of 12/03/19 (from the past 24 hour(s))  CBC     Status: None   Collection Time: 12/03/19  2:03 PM  Result Value Ref Range   WBC 8.6 4.0 - 10.5 K/uL   RBC 4.48 3.87 - 5.11 MIL/uL   Hemoglobin 13.9 12.0 -  15.0 g/dL   HCT 41.5 36.0 - 46.0 %   MCV 92.6 80.0 - 100.0 fL   MCH 31.0 26.0 - 34.0 pg   MCHC 33.5 30.0 - 36.0 g/dL   RDW 12.5 11.5 - 15.5 %   Platelets 207 150 - 400 K/uL   nRBC 0.0 0.0 - 0.2 %  Basic metabolic panel     Status: None   Collection Time: 12/03/19  2:03 PM  Result Value Ref Range   Sodium 138 135 - 145 mmol/L   Potassium 4.1 3.5 - 5.1 mmol/L   Chloride 102 98 - 111 mmol/L   CO2 25 22 - 32 mmol/L   Glucose, Bld 96 70 - 99 mg/dL   BUN 20 6 - 20 mg/dL   Creatinine, Ser 0.58 0.44 - 1.00 mg/dL   Calcium 9.2 8.9 - 10.3 mg/dL   GFR calc non Af Amer >60 >60 mL/min   GFR calc Af Amer >60 >60 mL/min   Anion gap 11 5 - 15  Respiratory Panel by RT PCR (Flu A&B, Covid) -  Nasopharyngeal Swab     Status: Abnormal   Collection Time: 12/03/19  2:03 PM   Specimen: Nasopharyngeal Swab  Result Value Ref Range   SARS Coronavirus 2 by RT PCR POSITIVE (A) NEGATIVE   Influenza A by PCR NEGATIVE NEGATIVE   Influenza B by PCR NEGATIVE NEGATIVE   Recent Results (from the past 240 hour(s))  Microscopic Examination     Status: Abnormal   Collection Time: 11/30/19  4:55 PM   URINE  Result Value Ref Range Status   WBC, UA 6-10 (A) 0 - 5 /hpf Final   RBC 11-30 (A) 0 - 2 /hpf Final   Epithelial Cells (non renal) 0-10 0 - 10 /hpf Final   Renal Epithel, UA None seen None seen /hpf Final   Bacteria, UA Many (A) None seen/Few Final  Urine Culture     Status: None   Collection Time: 11/30/19  5:05 PM   Specimen: Urine   URINE  Result Value Ref Range Status   Urine Culture, Routine Final report  Final   Organism ID, Bacteria No growth  Final  Respiratory Panel by RT PCR (Flu A&B, Covid) - Nasopharyngeal Swab     Status: Abnormal   Collection Time: 12/03/19  2:03 PM   Specimen: Nasopharyngeal Swab  Result Value Ref Range Status   SARS Coronavirus 2 by RT PCR POSITIVE (A) NEGATIVE Final    Comment: RESULT CALLED TO, READ BACK BY AND VERIFIED WITH: RUNYAN,T. @1555  ON  03.25.2021 BY COHEN,K (NOTE) SARS-CoV-2 target nucleic acids are DETECTED. SARS-CoV-2 RNA is generally detectable in upper respiratory specimens  during the acute phase of infection. Positive results are indicative of the presence of the identified virus, but do not rule out bacterial infection or co-infection with other pathogens not detected by the test. Clinical correlation with patient history and other diagnostic information is necessary to determine patient infection status. The expected result is Negative. Fact Sheet for Patients:  PinkCheek.be Fact Sheet for Healthcare Providers: GravelBags.it This test is not yet approved or cleared by  the [place] FDA and  has been authorized for detection and/or diagnosis of SARS-CoV-2 by FDA under an Emergency Use Authorization (EUA).  This EUA will remain in effect (meaning this test can be u sed) for the duration of  the COVID-19 declaration under Section 564(b)(1) of the Act, 21 U.S.C. section 360bbb-3(b)(1), unless the authorization is terminated or revoked sooner.  Influenza A by PCR NEGATIVE NEGATIVE Final   Influenza B by PCR NEGATIVE NEGATIVE Final    Comment: (NOTE) The Xpert Xpress SARS-CoV-2/FLU/RSV assay is intended as an aid in  the diagnosis of influenza from Nasopharyngeal swab specimens and  should not be used as a sole basis for treatment. Nasal washings and  aspirates are unacceptable for Xpert Xpress SARS-CoV-2/FLU/RSV  testing. Fact Sheet for Patients: PinkCheek.be Fact Sheet for Healthcare Providers: GravelBags.it This test is not yet approved or cleared by the [place] FDA and  has been authorized for detection and/or diagnosis of SARS-CoV-2 by  FDA under an Emergency Use Authorization (EUA). This EUA will remain  in effect (meaning this test can be used) for the duration of the  Covid-19 declaration under Section 564(b)(1) of the Act, 21  U.S.C. section 360bbb-3(b)(1), unless the authorization is  terminated or revoked. Performed at [facility], [place] [address], [place], [place] [address]    Creatinine: Recent Labs    12/03/19 1403  CREATININE 0.58   Baseline Creatinine: 0.5  Impression/Assessment:  54yo with left ureteral calculus, sepsis  Plan:  The risks/benefits/alternatives to left ureteral stent placement was explained to the patient and she understands and wishes to proceed with surgery  Nicolette Bang 12/03/2019, 4:28 PM

## 2019-12-04 DIAGNOSIS — I10 Essential (primary) hypertension: Secondary | ICD-10-CM | POA: Diagnosis not present

## 2019-12-04 DIAGNOSIS — E890 Postprocedural hypothyroidism: Secondary | ICD-10-CM | POA: Diagnosis not present

## 2019-12-04 DIAGNOSIS — N132 Hydronephrosis with renal and ureteral calculous obstruction: Secondary | ICD-10-CM | POA: Diagnosis not present

## 2019-12-04 DIAGNOSIS — Z85850 Personal history of malignant neoplasm of thyroid: Secondary | ICD-10-CM | POA: Diagnosis not present

## 2019-12-04 DIAGNOSIS — Z87442 Personal history of urinary calculi: Secondary | ICD-10-CM | POA: Diagnosis not present

## 2019-12-04 DIAGNOSIS — Z8616 Personal history of COVID-19: Secondary | ICD-10-CM | POA: Diagnosis not present

## 2019-12-04 LAB — CBC
HCT: 41.2 % (ref 36.0–46.0)
Hemoglobin: 14 g/dL (ref 12.0–15.0)
MCH: 30.3 pg (ref 26.0–34.0)
MCHC: 34 g/dL (ref 30.0–36.0)
MCV: 89.2 fL (ref 80.0–100.0)
Platelets: 256 10*3/uL (ref 150–400)
RBC: 4.62 MIL/uL (ref 3.87–5.11)
RDW: 12.4 % (ref 11.5–15.5)
WBC: 8.5 10*3/uL (ref 4.0–10.5)
nRBC: 0 % (ref 0.0–0.2)

## 2019-12-04 LAB — BASIC METABOLIC PANEL
Anion gap: 7 (ref 5–15)
BUN: 18 mg/dL (ref 6–20)
CO2: 28 mmol/L (ref 22–32)
Calcium: 8.9 mg/dL (ref 8.9–10.3)
Chloride: 100 mmol/L (ref 98–111)
Creatinine, Ser: 0.78 mg/dL (ref 0.44–1.00)
GFR calc Af Amer: >60 mL/min (ref >60)
GFR calc non Af Amer: >60 mL/min (ref >60)
Glucose, Bld: 153 mg/dL — ABNORMAL HIGH (ref 70–99)
Potassium: 4.5 mmol/L (ref 3.5–5.1)
Sodium: 135 mmol/L (ref 135–145)

## 2019-12-04 MED ORDER — FLUCONAZOLE 150 MG PO TABS
150.0000 mg | ORAL_TABLET | Freq: Once | ORAL | Status: AC
  Administered 2019-12-04: 150 mg via ORAL
  Filled 2019-12-04: qty 1

## 2019-12-04 MED ORDER — POLYETHYLENE GLYCOL 3350 17 G PO PACK
17.0000 g | PACK | Freq: Every day | ORAL | Status: DC
  Administered 2019-12-05: 17 g via ORAL
  Filled 2019-12-04 (×2): qty 1

## 2019-12-04 MED ORDER — DOCUSATE SODIUM 100 MG PO CAPS
100.0000 mg | ORAL_CAPSULE | Freq: Two times a day (BID) | ORAL | Status: DC
  Administered 2019-12-04 – 2019-12-05 (×3): 100 mg via ORAL
  Filled 2019-12-04 (×3): qty 1

## 2019-12-04 NOTE — Discharge Instructions (Signed)

## 2019-12-04 NOTE — Progress Notes (Signed)
Spoke with Leveda Anna, Public Health RN at [facility]. She confirmed with Langley Gauss at [facility] that patient had a previous positive COVID-19 test on 08/13/2019.

## 2019-12-04 NOTE — Progress Notes (Signed)
1 Day Post-Op Subjective: Patient reports pain control good. No fevers overnight. No nausea  Objective: Vital signs in last 24 hours: Temp:  [97.6 F (36.4 C)-98.7 F (37.1 C)] 98.6 F (37 C) (03/26 1317) Pulse Rate:  [67-85] 81 (03/26 1317) Resp:  [11-19] 16 (03/26 1317) BP: (121-154)/(67-85) 134/77 (03/26 1317) SpO2:  [94 %-100 %] 98 % (03/26 1317) Weight:  [91.2 kg] 91.2 kg (03/25 1432)  Intake/Output from previous day: 03/25 0701 - 03/26 0700 In: 1940 [P.O.:840; I.V.:1000; IV Piggyback:100] Out: 1800 [Urine:1800] Intake/Output this shift: No intake/output data recorded.  Physical Exam:  General:alert, cooperative and appears stated age GI: soft, non tender, normal bowel sounds, no palpable masses, no organomegaly, no inguinal hernia Female genitalia: not done Extremities: extremities normal, atraumatic, no cyanosis or edema  Lab Results: Recent Labs    12/03/19 1403 12/04/19 0544  HGB 13.9 14.0  HCT 41.5 41.2   BMET Recent Labs    12/03/19 1403 12/04/19 0544  NA 138 135  K 4.1 4.5  CL 102 100  CO2 25 28  GLUCOSE 96 153*  BUN 20 18  CREATININE 0.58 0.78  CALCIUM 9.2 8.9   No results for input(s): LABPT, INR in the last 72 hours. No results for input(s): LABURIN in the last 72 hours. Results for orders placed or performed during the hospital encounter of 12/03/19  Respiratory Panel by RT PCR (Flu A&B, Covid) - Nasopharyngeal Swab     Status: Abnormal   Collection Time: 12/03/19  2:03 PM   Specimen: Nasopharyngeal Swab  Result Value Ref Range Status   SARS Coronavirus 2 by RT PCR POSITIVE (A) NEGATIVE Final    Comment: RESULT CALLED TO, READ BACK BY AND VERIFIED WITH: RUNYAN,T. @1555  ON  03.25.2021 BY COHEN,K (NOTE) SARS-CoV-2 target nucleic acids are DETECTED. SARS-CoV-2 RNA is generally detectable in upper respiratory specimens  during the acute phase of infection. Positive results are indicative of the presence of the identified virus, but do not  rule out bacterial infection or co-infection with other pathogens not detected by the test. Clinical correlation with patient history and other diagnostic information is necessary to determine patient infection status. The expected result is Negative. Fact Sheet for Patients:  PinkCheek.be Fact Sheet for Healthcare Providers: GravelBags.it This test is not yet approved or cleared by the [place] FDA and  has been authorized for detection and/or diagnosis of SARS-CoV-2 by FDA under an Emergency Use Authorization (EUA).  This EUA will remain in effect (meaning this test can be u sed) for the duration of  the COVID-19 declaration under Section 564(b)(1) of the Act, 21 U.S.C. section 360bbb-3(b)(1), unless the authorization is terminated or revoked sooner.    Influenza A by PCR NEGATIVE NEGATIVE Final   Influenza B by PCR NEGATIVE NEGATIVE Final    Comment: (NOTE) The Xpert Xpress SARS-CoV-2/FLU/RSV assay is intended as an aid in  the diagnosis of influenza from Nasopharyngeal swab specimens and  should not be used as a sole basis for treatment. Nasal washings and  aspirates are unacceptable for Xpert Xpress SARS-CoV-2/FLU/RSV  testing. Fact Sheet for Patients: PinkCheek.be Fact Sheet for Healthcare Providers: GravelBags.it This test is not yet approved or cleared by the [place] FDA and  has been authorized for detection and/or diagnosis of SARS-CoV-2 by  FDA under an Emergency Use Authorization (EUA). This EUA will remain  in effect (meaning this test can be used) for the duration of the  Covid-19 declaration under Section 564(b)(1) of the Act,  21  U.S.C. section 360bbb-3(b)(1), unless the authorization is  terminated or revoked. Performed at [facility], [place] [address], [place], [place] [address]     Studies/Results: DG C-Arm 1-60  Min-No Report  Result Date: 12/03/2019 Fluoroscopy was utilized by the requesting physician.  No radiographic interpretation.    Assessment/Plan: POD#1 left ureteral stent placement  1. Continue rocephin 2. Discharge tomorrow on bactrim DS BID for 14 days   LOS: 1 day   Nicolette Bang 12/04/2019, 1:35 PM

## 2019-12-05 DIAGNOSIS — N132 Hydronephrosis with renal and ureteral calculous obstruction: Secondary | ICD-10-CM | POA: Diagnosis not present

## 2019-12-05 DIAGNOSIS — I10 Essential (primary) hypertension: Secondary | ICD-10-CM | POA: Diagnosis not present

## 2019-12-05 DIAGNOSIS — Z87442 Personal history of urinary calculi: Secondary | ICD-10-CM | POA: Diagnosis not present

## 2019-12-05 DIAGNOSIS — E890 Postprocedural hypothyroidism: Secondary | ICD-10-CM | POA: Diagnosis not present

## 2019-12-05 DIAGNOSIS — Z8616 Personal history of COVID-19: Secondary | ICD-10-CM | POA: Diagnosis not present

## 2019-12-05 DIAGNOSIS — Z85850 Personal history of malignant neoplasm of thyroid: Secondary | ICD-10-CM | POA: Diagnosis not present

## 2019-12-05 MED ORDER — OXYCODONE-ACETAMINOPHEN 5-325 MG PO TABS: 1.0000 | ORAL_TABLET | ORAL | 0 refills | Status: DC | PRN

## 2019-12-05 MED ORDER — PHENAZOPYRIDINE HCL 100 MG PO TABS
100.0000 mg | ORAL_TABLET | Freq: Three times a day (TID) | ORAL | Status: DC | PRN
  Administered 2019-12-05: 100 mg via ORAL
  Filled 2019-12-05 (×3): qty 1

## 2019-12-05 MED ORDER — SULFAMETHOXAZOLE-TRIMETHOPRIM 800-160 MG PO TABS: 1.0000 | ORAL_TABLET | Freq: Two times a day (BID) | ORAL | 0 refills | Status: DC

## 2019-12-05 NOTE — Discharge Summary (Signed)
Physician Discharge Summary  Patient ID: Ann Peterson MRN: [id number] DOB/AGE: Nov 24, 1965 54 y.o.  Admit date: 12/03/2019 Discharge date: 12/05/2019  Admission Diagnoses:  Discharge Diagnoses:  Active Problems:   Hydronephrosis concurrent with and due to calculi of kidney and ureter   Discharged Condition: good  Hospital Course: 54 year old female with a history of nephrolithiasis presented to our office on 12/03/2019 with a 5-day history left flank pain and a fever up to 101.  Last stone event was 2019.  Given the concern for infected stone, decision was made to proceed to the operating room for a left ureteral stent placement.  Patient tolerated the procedure well and was stable postoperatively.  She remained on Rocephin.  Culture here showed no growth from 11/30/2019.  Decision was made to discharge patient home after she recovered and was doing well on 12/05/2019 with a 2-week course of Bactrim.  Consults: None  Significant Diagnostic Studies: None  Treatments: surgery: As above  Discharge Exam: Blood pressure 111/66, pulse 70, temperature 98 F (36.7 C), temperature source Oral, resp. rate 18, height 5\' 3"  (1.6 m), weight 91.2 kg, last menstrual period 09/07/2016, SpO2 96 %. General appearance: alert no acute distress Adequate perfusion of extremities Nonlabored respiration Abdomen soft, nontender, nondistended  Disposition: Discharge disposition: 01-Home or Self Care        Allergies as of 12/05/2019      Reactions   Pollen Extract       Medication List    STOP taking these medications   cephALEXin 500 MG capsule Commonly known as: KEFLEX   HYDROcodone-acetaminophen 5-325 MG tablet Commonly known as: Norco     TAKE these medications   amLODipine 10 MG tablet Commonly known as: NORVASC TAKE 1 TABLET BY MOUTH EVERY DAY   BIOTIN PO Take 1 capsule by mouth every evening.   fluticasone 50 MCG/ACT nasal spray Commonly known as: FLONASE SPRAY 2 SPRAYS INTO  EACH NOSTRIL EVERY DAY What changed: See the new instructions.   gabapentin 100 MG capsule Commonly known as: NEURONTIN Take 1 capsule (100 mg total) by mouth at bedtime.   levothyroxine 175 MCG tablet Commonly known as: SYNTHROID TAKE 1 TABLET (200 MCG TOTAL) BY MOUTH DAILY. What changed:   how much to take  how to take this  when to take this  additional instructions   lisinopril 20 MG tablet Commonly known as: ZESTRIL Take 1 tablet (20 mg total) by mouth daily. (Need to be seen)   metoprolol succinate 100 MG 24 hr tablet Commonly known as: TOPROL-XL Take 1 tablet (100 mg total) by mouth daily. Take with or immediately following a meal   Multivitamin Adults Tabs Take 1 tablet by mouth every evening.   oxyCODONE-acetaminophen 5-325 MG tablet Commonly known as: Percocet Take 1 tablet by mouth every 4 (four) hours as needed for severe pain.   sulfamethoxazole-trimethoprim 800-160 MG tablet Commonly known as: BACTRIM DS Take 1 tablet by mouth 2 (two) times daily.   tamsulosin 0.4 MG Caps capsule Commonly known as: FLOMAX Take 1 capsule (0.4 mg total) by mouth daily. x7 days   TURMERIC PO Take 1 capsule by mouth daily.   Vitamin D 50 MCG (2000 UT) Caps Take 1 capsule by mouth every evening.      Follow-up Information    McKenzie, Candee Furbish, MD. Call in 2 week(s).   Specialty: Urology Contact information: [place] [address] [phone]           Signed: Wonda Cheng  Alysen Smylie, III 12/05/2019, 11:22 AM

## 2019-12-08 ENCOUNTER — Other Ambulatory Visit: Payer: Self-pay | Admitting: Urology

## 2019-12-10 ENCOUNTER — Other Ambulatory Visit: Payer: Self-pay | Admitting: Family Medicine

## 2019-12-10 DIAGNOSIS — I10 Essential (primary) hypertension: Secondary | ICD-10-CM

## 2019-12-16 NOTE — Addendum Note (Signed)
Addended by: Cleon Gustin on: 12/16/2019 01:14 PM   Modules accepted: Orders

## 2019-12-17 ENCOUNTER — Encounter (HOSPITAL_BASED_OUTPATIENT_CLINIC_OR_DEPARTMENT_OTHER): Payer: Self-pay | Admitting: Urology

## 2019-12-17 ENCOUNTER — Other Ambulatory Visit: Payer: Self-pay

## 2019-12-17 NOTE — Progress Notes (Addendum)
Spoke w/ via phone for pre-op interview---patient Lab needs dos----I stat 8, ekg             COVID test ------ positive covid 12-03-2019 no retest needed Arrive at -------630 am 12-24-2019 NPO after ------midnight Medications to take morning of surgery ----- Diabetic medication -----levothyroxine, oxycodone prn Patient Special Instructions -----bring cpap mask tubing and machine and leave in car Pre-Op special Istructions ----- Patient verbalized understanding of instructions that were given at this phone interview. Patient denies shortness of breath, chest pain, fever, cough a this phone interview.

## 2019-12-22 ENCOUNTER — Other Ambulatory Visit: Payer: Self-pay | Admitting: Family Medicine

## 2019-12-22 DIAGNOSIS — R109 Unspecified abdominal pain: Secondary | ICD-10-CM

## 2019-12-22 NOTE — Telephone Encounter (Signed)
Is she actually requesting this medicine? She was seen by urology for surgical intervention of her kidney stone I believe recently.

## 2019-12-23 NOTE — Anesthesia Preprocedure Evaluation (Addendum)
Anesthesia Evaluation  Patient identified by MRN, date of birth, ID band Patient awake    Reviewed: Allergy & Precautions, NPO status , Patient's Chart, lab work & pertinent test results, reviewed documented beta blocker date and time   History of Anesthesia Complications (+) PONV and history of anesthetic complications  Airway Mallampati: I  TM Distance: >3 FB Neck ROM: Full    Dental no notable dental hx.    Pulmonary sleep apnea and Continuous Positive Airway Pressure Ventilation ,    Pulmonary exam normal breath sounds clear to auscultation       Cardiovascular hypertension, Pt. on medications and Pt. on home beta blockers Normal cardiovascular exam Rhythm:Regular Rate:Normal  ECG: NSR, rate 67   Neuro/Psych PSYCHIATRIC DISORDERS negative neurological ROS     GI/Hepatic negative GI ROS, Neg liver ROS,   Endo/Other  Hypothyroidism   Renal/GU Renal disease     Musculoskeletal negative musculoskeletal ROS (+)   Abdominal (+) + obese,   Peds  Hematology negative hematology ROS (+)   Anesthesia Other Findings LEFT URETERAL CALCULUS  Reproductive/Obstetrics                            Anesthesia Physical Anesthesia Plan  ASA: II  Anesthesia Plan: General   Post-op Pain Management:    Induction: Intravenous  PONV Risk Score and Plan: 4 or greater and Ondansetron, Dexamethasone, Midazolam and Treatment may vary due to age or medical condition  Airway Management Planned: LMA  Additional Equipment:   Intra-op Plan:   Post-operative Plan: Extubation in OR  Informed Consent: I have reviewed the patients History and Physical, chart, labs and discussed the procedure including the risks, benefits and alternatives for the proposed anesthesia with the patient or authorized representative who has indicated his/her understanding and acceptance.     Dental advisory given  Plan Discussed  with: CRNA  Anesthesia Plan Comments:        Anesthesia Quick Evaluation

## 2019-12-23 NOTE — Telephone Encounter (Signed)
Lmtcb.

## 2019-12-24 ENCOUNTER — Ambulatory Visit (HOSPITAL_BASED_OUTPATIENT_CLINIC_OR_DEPARTMENT_OTHER): Payer: BC Managed Care – PPO | Admitting: Anesthesiology

## 2019-12-24 ENCOUNTER — Ambulatory Visit (HOSPITAL_BASED_OUTPATIENT_CLINIC_OR_DEPARTMENT_OTHER)
Admission: RE | Admit: 2019-12-24 | Discharge: 2019-12-24 | Disposition: A | Discharge status: Home / Self Care | Payer: BC Managed Care – PPO | Attending: Urology | Admitting: Urology

## 2019-12-24 ENCOUNTER — Encounter (HOSPITAL_BASED_OUTPATIENT_CLINIC_OR_DEPARTMENT_OTHER)
Admission: RE | Disposition: A | Discharge status: Home / Self Care | Payer: Self-pay | Source: Home / Self Care | Attending: Urology

## 2019-12-24 ENCOUNTER — Encounter (HOSPITAL_BASED_OUTPATIENT_CLINIC_OR_DEPARTMENT_OTHER): Payer: Self-pay | Admitting: Urology

## 2019-12-24 DIAGNOSIS — I10 Essential (primary) hypertension: Secondary | ICD-10-CM | POA: Insufficient documentation

## 2019-12-24 DIAGNOSIS — G4730 Sleep apnea, unspecified: Secondary | ICD-10-CM | POA: Insufficient documentation

## 2019-12-24 DIAGNOSIS — E039 Hypothyroidism, unspecified: Secondary | ICD-10-CM | POA: Diagnosis not present

## 2019-12-24 DIAGNOSIS — Z85850 Personal history of malignant neoplasm of thyroid: Secondary | ICD-10-CM | POA: Diagnosis not present

## 2019-12-24 DIAGNOSIS — Z87442 Personal history of urinary calculi: Secondary | ICD-10-CM | POA: Diagnosis not present

## 2019-12-24 DIAGNOSIS — E890 Postprocedural hypothyroidism: Secondary | ICD-10-CM | POA: Diagnosis not present

## 2019-12-24 DIAGNOSIS — N201 Calculus of ureter: Secondary | ICD-10-CM | POA: Diagnosis not present

## 2019-12-24 HISTORY — DX: Nausea with vomiting, unspecified: R11.2

## 2019-12-24 HISTORY — DX: Personal history of urinary calculi: Z87.442

## 2019-12-24 HISTORY — PX: CYSTOSCOPY WITH RETROGRADE PYELOGRAM, URETEROSCOPY AND STENT PLACEMENT: SHX5789

## 2019-12-24 HISTORY — DX: Sleep apnea, unspecified: G47.30

## 2019-12-24 HISTORY — DX: Other specified postprocedural states: Z98.890

## 2019-12-24 LAB — POCT I-STAT, CHEM 8
BUN: 24 mg/dL — ABNORMAL HIGH (ref 6–20)
Calcium, Ion: 1.1 mmol/L — ABNORMAL LOW (ref 1.15–1.40)
Chloride: 100 mmol/L (ref 98–111)
Creatinine, Ser: 0.6 mg/dL (ref 0.44–1.00)
Glucose, Bld: 101 mg/dL — ABNORMAL HIGH (ref 70–99)
HCT: 36 % (ref 36.0–46.0)
Hemoglobin: 12.2 g/dL (ref 12.0–15.0)
Potassium: 4.1 mmol/L (ref 3.5–5.1)
Sodium: 138 mmol/L (ref 135–145)
TCO2: 32 mmol/L (ref 22–32)

## 2019-12-24 SURGERY — CYSTOURETEROSCOPY, WITH RETROGRADE PYELOGRAM AND STENT INSERTION
Anesthesia: General | Site: Ureter | Laterality: Left

## 2019-12-24 MED ORDER — PROPOFOL 10 MG/ML IV BOLUS
INTRAVENOUS | Status: AC
  Filled 2019-12-24: qty 40

## 2019-12-24 MED ORDER — OXYCODONE HCL 5 MG PO TABS
5.0000 mg | ORAL_TABLET | Freq: Once | ORAL | Status: AC | PRN
  Administered 2019-12-24: 5 mg via ORAL
  Filled 2019-12-24: qty 1

## 2019-12-24 MED ORDER — MIDAZOLAM HCL 2 MG/2ML IJ SOLN
INTRAMUSCULAR | Status: DC | PRN
  Administered 2019-12-24: 2 mg via INTRAVENOUS

## 2019-12-24 MED ORDER — WHITE PETROLATUM EX OINT
TOPICAL_OINTMENT | CUTANEOUS | Status: AC
  Filled 2019-12-24: qty 5

## 2019-12-24 MED ORDER — CEFTRIAXONE SODIUM 2 G IJ SOLR
INTRAMUSCULAR | Status: AC
  Filled 2019-12-24: qty 20

## 2019-12-24 MED ORDER — DEXAMETHASONE SODIUM PHOSPHATE 10 MG/ML IJ SOLN
INTRAMUSCULAR | Status: DC | PRN
  Administered 2019-12-24 (×2): 5 mg via INTRAVENOUS

## 2019-12-24 MED ORDER — HYDROMORPHONE HCL 1 MG/ML IJ SOLN
0.2500 mg | INTRAMUSCULAR | Status: DC | PRN
  Filled 2019-12-24: qty 0.5

## 2019-12-24 MED ORDER — MIDAZOLAM HCL 2 MG/2ML IJ SOLN
INTRAMUSCULAR | Status: AC
  Filled 2019-12-24: qty 2

## 2019-12-24 MED ORDER — SODIUM CHLORIDE 0.9 % IV SOLN
2.0000 g | INTRAVENOUS | Status: AC
  Administered 2019-12-24: 2 g via INTRAVENOUS
  Filled 2019-12-24: qty 20

## 2019-12-24 MED ORDER — ACETAMINOPHEN 500 MG PO TABS
1000.0000 mg | ORAL_TABLET | Freq: Once | ORAL | Status: AC
  Administered 2019-12-24: 07:00:00 1000 mg via ORAL
  Filled 2019-12-24: qty 2

## 2019-12-24 MED ORDER — OXYCODONE HCL 5 MG PO TABS
ORAL_TABLET | ORAL | Status: AC
  Filled 2019-12-24: qty 1

## 2019-12-24 MED ORDER — PROMETHAZINE HCL 25 MG/ML IJ SOLN
6.2500 mg | INTRAMUSCULAR | Status: DC | PRN
  Filled 2019-12-24: qty 1

## 2019-12-24 MED ORDER — IOHEXOL 300 MG/ML  SOLN
INTRAMUSCULAR | Status: DC | PRN
  Administered 2019-12-24: 5 mL via URETHRAL

## 2019-12-24 MED ORDER — ACETAMINOPHEN 500 MG PO TABS
ORAL_TABLET | ORAL | Status: AC
  Filled 2019-12-24: qty 2

## 2019-12-24 MED ORDER — LIDOCAINE 2% (20 MG/ML) 5 ML SYRINGE
INTRAMUSCULAR | Status: AC
  Filled 2019-12-24: qty 5

## 2019-12-24 MED ORDER — PROPOFOL 10 MG/ML IV BOLUS
INTRAVENOUS | Status: DC | PRN
  Administered 2019-12-24: 150 mg via INTRAVENOUS

## 2019-12-24 MED ORDER — KETOROLAC TROMETHAMINE 30 MG/ML IJ SOLN
30.0000 mg | Freq: Once | INTRAMUSCULAR | Status: DC | PRN
  Filled 2019-12-24: qty 1

## 2019-12-24 MED ORDER — SODIUM CHLORIDE 0.9 % IV SOLN
INTRAVENOUS | Status: AC
  Filled 2019-12-24: qty 100

## 2019-12-24 MED ORDER — ONDANSETRON HCL 4 MG/2ML IJ SOLN
INTRAMUSCULAR | Status: DC | PRN
  Administered 2019-12-24: 4 mg via INTRAVENOUS

## 2019-12-24 MED ORDER — OXYCODONE HCL 5 MG/5ML PO SOLN
5.0000 mg | Freq: Once | ORAL | Status: AC | PRN
  Filled 2019-12-24: qty 5

## 2019-12-24 MED ORDER — OXYCODONE-ACETAMINOPHEN 5-325 MG PO TABS: 1.0000 | ORAL_TABLET | ORAL | 0 refills | Status: DC | PRN

## 2019-12-24 MED ORDER — FENTANYL CITRATE (PF) 100 MCG/2ML IJ SOLN
INTRAMUSCULAR | Status: AC
  Filled 2019-12-24: qty 2

## 2019-12-24 MED ORDER — SODIUM CHLORIDE 0.9 % IV SOLN
INTRAVENOUS | Status: DC
  Filled 2019-12-24: qty 1000

## 2019-12-24 MED ORDER — FENTANYL CITRATE (PF) 100 MCG/2ML IJ SOLN
INTRAMUSCULAR | Status: DC | PRN
  Administered 2019-12-24: 25 ug via INTRAVENOUS
  Administered 2019-12-24: 50 ug via INTRAVENOUS
  Administered 2019-12-24: 25 ug via INTRAVENOUS

## 2019-12-24 MED ORDER — LIDOCAINE 2% (20 MG/ML) 5 ML SYRINGE
INTRAMUSCULAR | Status: DC | PRN
  Administered 2019-12-24: 60 mg via INTRAVENOUS

## 2019-12-24 MED ORDER — KETOROLAC TROMETHAMINE 30 MG/ML IJ SOLN
INTRAMUSCULAR | Status: DC | PRN
  Administered 2019-12-24: 30 mg via INTRAVENOUS

## 2019-12-24 SURGICAL SUPPLY — 18 items
BAG DRAIN URO-CYSTO SKYTR STRL (DRAIN) ×4 IMPLANT
BASKET STONE 1.7 NGAGE (UROLOGICAL SUPPLIES) ×2 IMPLANT
CLOTH BEACON ORANGE TIMEOUT ST (SAFETY) ×4 IMPLANT
GLOVE BIO SURGEON STRL SZ7 (GLOVE) ×2 IMPLANT
GLOVE BIO SURGEON STRL SZ8 (GLOVE) ×4 IMPLANT
GLOVE BIOGEL PI IND STRL 7.0 (GLOVE) IMPLANT
GLOVE BIOGEL PI INDICATOR 7.0 (GLOVE) ×2
GOWN STRL REUS W/TWL LRG LVL3 (GOWN DISPOSABLE) ×2 IMPLANT
GOWN STRL REUS W/TWL XL LVL3 (GOWN DISPOSABLE) ×4 IMPLANT
GUIDEWIRE ZIPWRE .038 STRAIGHT (WIRE) ×4 IMPLANT
IV NS IRRIG 3000ML ARTHROMATIC (IV SOLUTION) ×4 IMPLANT
KIT TURNOVER CYSTO (KITS) ×4 IMPLANT
MANIFOLD NEPTUNE II (INSTRUMENTS) ×4 IMPLANT
PACK CYSTO (CUSTOM PROCEDURE TRAY) ×4 IMPLANT
SYR 10ML LL (SYRINGE) ×4 IMPLANT
TUBE CONNECTING 12'X1/4 (SUCTIONS) ×1
TUBE CONNECTING 12X1/4 (SUCTIONS) ×1 IMPLANT
TUBING UROLOGY SET (TUBING) ×4 IMPLANT

## 2019-12-24 NOTE — Anesthesia Procedure Notes (Signed)
Procedure Name: LMA Insertion Date/Time: 12/24/2019 8:37 AM Performed by: Suan Halter, CRNA Pre-anesthesia Checklist: Patient identified, Emergency Drugs available, Suction available and Patient being monitored Patient Re-evaluated:Patient Re-evaluated prior to induction Oxygen Delivery Method: Circle system utilized Preoxygenation: Pre-oxygenation with 100% oxygen Induction Type: IV induction Ventilation: Mask ventilation without difficulty LMA: LMA inserted LMA Size: 4.0 Number of attempts: 1 Airway Equipment and Method: Bite block Placement Confirmation: positive ETCO2 Tube secured with: Tape Dental Injury: Teeth and Oropharynx as per pre-operative assessment

## 2019-12-24 NOTE — Anesthesia Postprocedure Evaluation (Signed)
Anesthesia Post Note  Patient: Ann Peterson  Procedure(s) Performed: CYSTOSCOPY WITH RETROGRADE PYELOGRAM, URETEROSCOPY, STENT REMOVAL AND STONE EXTRACTION (Left Ureter)     Patient location during evaluation: PACU Anesthesia Type: General Level of consciousness: awake and alert Pain management: pain level controlled Vital Signs Assessment: post-procedure vital signs reviewed and stable Respiratory status: spontaneous breathing, nonlabored ventilation, respiratory function stable and patient connected to nasal cannula oxygen Cardiovascular status: blood pressure returned to baseline and stable Postop Assessment: no apparent nausea or vomiting Anesthetic complications: no    Last Vitals:  Vitals:   12/24/19 1035 12/24/19 1037  BP: (!) 141/70 (!) (P) 141/70  Pulse: 65 (P) 65  Resp: 16 (P) 16  Temp: 36.8 C (P) 36.8 C  SpO2: 96% (P) 94%    Last Pain:  Vitals:   12/24/19 1035  TempSrc:   PainSc: 8                  Carel Schnee P Artavis Cowie

## 2019-12-24 NOTE — Discharge Instructions (Signed)
Ureteroscopy °Ureteroscopy is a procedure to check for and treat problems inside part of the urinary tract. In this procedure, a thin, tube-shaped instrument with a light at the end (ureteroscope) is used to look at the inside of the kidneys and the ureters, which are the tubes that carry urine from the kidneys to the bladder. The ureteroscope is inserted into one or both of the ureters. °You may need this procedure if you have frequent urinary tract infections (UTIs), blood in your urine, or a stone in one of your ureters. A ureteroscopy can be done to find the cause of urine blockage in a ureter and to evaluate other abnormalities inside the ureters or kidneys. If stones are found, they can be removed during the procedure. Polyps, abnormal tissue, and some types of tumors can also be removed or treated. The ureteroscope may also have a tool to remove tissue to be checked for disease under a microscope (biopsy). °Tell a health care provider about: °· Any allergies you have. °· All medicines you are taking, including vitamins, herbs, eye drops, creams, and over-the-counter medicines. °· Any problems you or family members have had with anesthetic medicines. °· Any blood disorders you have. °· Any surgeries you have had. °· Any medical conditions you have. °· Whether you are pregnant or may be pregnant. °What are the risks? °Generally, this is a safe procedure. However, problems may occur, including: °· Bleeding. °· Infection. °· Allergic reactions to medicines. °· Scarring that narrows the ureter (stricture). °· Creating a hole in the ureter (perforation). °What happens before the procedure? °Staying hydrated °Follow instructions from your health care provider about hydration, which may include: °· Up to 2 hours before the procedure - you may continue to drink clear liquids, such as water, clear fruit juice, black coffee, and plain tea. °Eating and drinking restrictions °Follow instructions from your health care  provider about eating and drinking, which may include: °· 8 hours before the procedure - stop eating heavy meals or foods such as meat, fried foods, or fatty foods. °· 6 hours before the procedure - stop eating light meals or foods, such as toast or cereal. °· 6 hours before the procedure - stop drinking milk or drinks that contain milk. °· 2 hours before the procedure - stop drinking clear liquids. °Medicines °· Ask your health care provider about: °? Changing or stopping your regular medicines. This is especially important if you are taking diabetes medicines or blood thinners. °? Taking medicines such as aspirin and ibuprofen. These medicines can thin your blood. Do not take these medicines before your procedure if your health care provider instructs you not to. °· You may be given antibiotic medicine to help prevent infection. °General instructions °· You may have a urine sample taken to check for infection. °· Plan to have someone take you home from the hospital or clinic. °What happens during the procedure? ° °· To reduce your risk of infection: °? Your health care team will wash or sanitize their hands. °? Your skin will be washed with soap. °· An IV tube will be inserted into one of your veins. °· You will be given one of the following: °? A medicine to help you relax (sedative). °? A medicine to make you fall asleep (general anesthetic). °? A medicine that is injected into your spine to numb the area below and slightly above the injection site (spinal anesthetic). °· To lower your risk of infection, you may be given an antibiotic medicine   by an injection or through the IV tube.  The opening from which you urinate (urethra) will be cleaned with a germ-killing solution.  The ureteroscope will be passed through your urethra into your bladder.  A salt-water solution will flow through the ureteroscope to fill your bladder. This will help the health care provider see the openings of your ureters more  clearly.  Then, the ureteroscope will be passed into your ureter. ? If a growth is found, a piece of it may be removed so it can be examined under a microscope (biopsy). ? If a stone is found, it may be removed through the ureteroscope, or the stone may be broken up using a laser, shock waves, or electrical energy. ? In some cases, if the ureter is too small, a tube may be inserted that keeps the ureter open (ureteral stent). The stent may be left in place for 1 or 2 weeks to keep the ureter open, and then the ureteroscopy procedure will be performed.  The scope will be removed, and your bladder will be emptied. The procedure may vary among health care providers and hospitals. What happens after the procedure?  Your blood pressure, heart rate, breathing rate, and blood oxygen level will be monitored until the medicines you were given have worn off.  You may be asked to urinate.  Donot drive for 24 hours if you were given a sedative. This information is not intended to replace advice given to you by your health care provider. Make sure you discuss any questions you have with your health care provider. Document Revised: 08/09/2017 Document Reviewed: 06/08/2016 Elsevier Patient Education  [place] Instructions  Activity: Get plenty of rest for the remainder of the day. A responsible individual must stay with you for 24 hours following the procedure.  For the next 24 hours, DO NOT: -Drive a car -Paediatric nurse -Drink alcoholic beverages -Take any medication unless instructed by your physician -Make any legal decisions or sign important papers.  Meals: Start with liquid foods such as gelatin or soup. Progress to regular foods as tolerated. Avoid greasy, spicy, heavy foods. If nausea and/or vomiting occur, drink only clear liquids until the nausea and/or vomiting subsides. Call your physician if vomiting continues.  Special  Instructions/Symptoms: Your throat may feel dry or sore from the anesthesia or the breathing tube placed in your throat during surgery. If this causes discomfort, gargle with warm salt water. The discomfort should disappear within 24 hours.  If you had a scopolamine patch placed behind your ear for the management of post- operative nausea and/or vomiting:  1. The medication in the patch is effective for 72 hours, after which it should be removed.  Wrap patch in a tissue and discard in the trash. Wash hands thoroughly with soap and water. 2. You may remove the patch earlier than 72 hours if you experience unpleasant side effects which may include dry mouth, dizziness or visual disturbances. 3. Avoid touching the patch. Wash your hands with soap and water after contact with the patch.

## 2019-12-24 NOTE — Transfer of Care (Signed)
Immediate Anesthesia Transfer of Care Note  Patient: Ann Peterson  Procedure(s) Performed: Procedure(s) (LRB): CYSTOSCOPY WITH RETROGRADE PYELOGRAM, URETEROSCOPY, STENT REMOVAL AND STONE EXTRACTION (Left)  Patient Location: PACU  Anesthesia Type: General  Level of Consciousness: awake, oriented, sedated and patient cooperative  Airway & Oxygen Therapy: Patient Spontanous Breathing and Patient connected to face mask oxygen  Post-op Assessment: Report given to PACU RN and Post -op Vital signs reviewed and stable  Post vital signs: Reviewed and stable  Complications: No apparent anesthesia complications Last Vitals:  Vitals Value Taken Time  BP 139/71 12/24/19 0903  Temp 36.9 C 12/24/19 0903  Pulse 66 12/24/19 0910  Resp 18 12/24/19 0910  SpO2 98 % 12/24/19 0910  Vitals shown include unvalidated device data.  Last Pain:  Vitals:   12/24/19 0903  TempSrc:   PainSc: 0-No pain      Patients Stated Pain Goal: 4 (12/24/19 0701)

## 2019-12-24 NOTE — Op Note (Signed)
.  Preoperative diagnosis: Left ureteral stone  Postoperative diagnosis: Same  Procedure: 1 cystoscopy 2. Left retrograde pyelography 3.  Intraoperative fluoroscopy, under one hour, with interpretation 4.  Left ureteroscopic stone manipulation with with basket extraction 5.  Left 6 x 24 JJ stent removal  Attending: Rosie Fate  Anesthesia: General  Estimated blood loss: None  Drains: none  Specimens: stone for analysis  Antibiotics: rocephin  Findings: left distal ureteral stone. no hydronephrosis. No masses/lesions in the bladder. Ureteral orifices in normal anatomic location.  Indications: Patient is a 54 year old female with a history of left ureteral stone and who underwent left stent placement for sepsis 3 weeks ago.  After discussing treatment options, she decided proceed with left ureteroscopic stone manipulation.  Procedure her in detail: The patient was brought to the operating room and a brief timeout was done to ensure correct patient, correct procedure, correct site.  General anesthesia was administered patient was placed in dorsal lithotomy position.  Her genitalia was then prepped and draped in usual sterile fashion.  A rigid 18 French cystoscope was passed in the urethra and the bladder.  Bladder was inspected free masses or lesions.  the ureteral orifices were in the normal orthotopic locations. Using a grasper the left ureteral stent was brought to the urethral meatus. A zipwire was advanced through the stent and up to the renal pelvis. The stent was then removed. a 6 french ureteral catheter was then instilled into the left ureteral orifice.  a gentle retrograde was obtained and findings noted above.  we then removed the cystoscope and cannulated the left ureteral orifice with a semirigid ureteroscope.  We located a stone in the distal ureter which was removed with an NGage basket. Repeat ureteroscopy to the UPJ revealed no additional calculi. We elected not to leave a  stent since this was an uncomplicated ureteroscopy. the bladder was then drained and this concluded the procedure which was well tolerated by patient.  Complications: None  Condition: Stable, extubated, transferred to PACU  Plan: Patient is to be discharged home as to follow-up in 2 weeks

## 2019-12-24 NOTE — Interval H&P Note (Signed)
History and Physical Interval Note:  12/24/2019 8:25 AM  Ann Peterson  has presented today for surgery, with the diagnosis of LEFT URETERAL CALCULUS.  The various methods of treatment have been discussed with the patient and family. After consideration of risks, benefits and other options for treatment, the patient has consented to  Procedure(s) with comments: CYSTOSCOPY WITH RETROGRADE PYELOGRAM, URETEROSCOPY AND STENT PLACEMENT (Left) - 30 MINS HOLMIUM LASER APPLICATION (Left) as a surgical intervention.  [name] history has been reviewed, patient examined, no change in status, stable for surgery.  I have reviewed the patient's chart and labs.  Questions were answered to the patient's satisfaction.     Nicolette Bang

## 2019-12-31 DIAGNOSIS — N201 Calculus of ureter: Secondary | ICD-10-CM | POA: Diagnosis not present

## 2020-01-01 ENCOUNTER — Other Ambulatory Visit: Payer: Self-pay

## 2020-01-01 DIAGNOSIS — N200 Calculus of kidney: Secondary | ICD-10-CM

## 2020-01-11 ENCOUNTER — Other Ambulatory Visit: Payer: Self-pay | Admitting: Family Medicine

## 2020-01-11 DIAGNOSIS — J302 Other seasonal allergic rhinitis: Secondary | ICD-10-CM

## 2020-01-19 ENCOUNTER — Other Ambulatory Visit: Payer: Self-pay | Admitting: Family Medicine

## 2020-01-19 DIAGNOSIS — E039 Hypothyroidism, unspecified: Secondary | ICD-10-CM

## 2020-02-26 ENCOUNTER — Telehealth: Payer: Self-pay | Admitting: Family Medicine

## 2020-02-26 DIAGNOSIS — E039 Hypothyroidism, unspecified: Secondary | ICD-10-CM

## 2020-02-26 MED ORDER — LEVOTHYROXINE SODIUM 175 MCG PO TABS: ORAL_TABLET | ORAL | 0 refills | Status: DC

## 2020-02-26 NOTE — Telephone Encounter (Signed)
Refill sent to pharmacy.  Left detailed message

## 2020-03-21 ENCOUNTER — Other Ambulatory Visit: Payer: Self-pay | Admitting: Family Medicine

## 2020-03-21 DIAGNOSIS — E039 Hypothyroidism, unspecified: Secondary | ICD-10-CM

## 2020-03-28 ENCOUNTER — Ambulatory Visit: Payer: BC Managed Care – PPO | Admitting: Urology

## 2020-03-29 ENCOUNTER — Encounter: Payer: Self-pay | Admitting: Family

## 2020-03-29 ENCOUNTER — Telehealth: Payer: Self-pay | Admitting: Family Medicine

## 2020-03-29 ENCOUNTER — Ambulatory Visit (INDEPENDENT_AMBULATORY_CARE_PROVIDER_SITE_OTHER): Payer: BC Managed Care – PPO | Admitting: Family

## 2020-03-29 DIAGNOSIS — R399 Unspecified symptoms and signs involving the genitourinary system: Secondary | ICD-10-CM

## 2020-03-29 LAB — URINALYSIS, COMPLETE
Bilirubin, UA: NEGATIVE
Glucose, UA: NEGATIVE
Ketones, UA: NEGATIVE
Leukocytes,UA: NEGATIVE
Nitrite, UA: NEGATIVE
Protein,UA: NEGATIVE
Specific Gravity, UA: 1.02 (ref 1.005–1.030)
Urobilinogen, Ur: 0.2 mg/dL (ref 0.2–1.0)
pH, UA: 7 (ref 5.0–7.5)

## 2020-03-29 LAB — MICROSCOPIC EXAMINATION
Bacteria, UA: NONE SEEN
Renal Epithel, UA: NONE SEEN /hpf
WBC, UA: NONE SEEN /hpf (ref 0–5)

## 2020-03-29 MED ORDER — CEPHALEXIN 500 MG PO CAPS: 500.0000 mg | ORAL_CAPSULE | Freq: Two times a day (BID) | ORAL | 0 refills | Status: DC

## 2020-03-29 NOTE — Telephone Encounter (Signed)
Appt made

## 2020-03-29 NOTE — Telephone Encounter (Signed)
Pt called stating that she believes she has a UTI. Pt has an appt scheduled for next week but doesn't want to wait that long to be seen if she does in fact have a UTI. Can we see pt as a televisit and have her come in to give urine sample today or tomorrow?

## 2020-03-29 NOTE — Addendum Note (Signed)
Addended by: Earlene Plater on: 03/29/2020 04:33 PM   Modules accepted: Orders

## 2020-03-29 NOTE — Progress Notes (Signed)
   Virtual Visit via telephone Note Due to COVID-19 pandemic this visit was conducted virtually. This visit type was conducted due to national recommendations for restrictions regarding the COVID-19 Pandemic (e.g. social distancing, sheltering in place) in an effort to limit this patient's exposure and mitigate transmission in our community. All issues noted in this document were discussed and addressed.  A physical exam was not performed with this format.  I connected with Ann Peterson on 03/29/20 at 2:48 pm by telephone and verified that I am speaking with the correct person using two identifiers. Ann Peterson is currently located at home and no one is currently with her during visit. The provider, Evelina Dun, FNP is located in their office at time of visit.  I discussed the limitations, risks, security and privacy concerns of performing an evaluation and management service by telephone and the availability of in person appointments. I also discussed with the patient that there may be a patient responsible charge related to this service. The patient expressed understanding and agreed to proceed.   History and Present Illness:  Dysuria  This is a new problem. The current episode started today. The problem occurs every urination. The problem has been gradually worsening. The quality of the pain is described as burning. The pain is at a severity of 7/10. The pain is mild. There has been no fever. Associated symptoms include frequency and urgency. Pertinent negatives include no discharge, flank pain, hematuria, nausea or vomiting. She has tried increased fluids for the symptoms. The treatment provided mild relief.      Review of Systems  Gastrointestinal: Negative for nausea and vomiting.  Genitourinary: Positive for dysuria, frequency and urgency. Negative for flank pain and hematuria.     Observations/Objective: No SOB or distress noted  Assessment and Plan: 1. UTI symptoms Pt will  come in and leave urine sample.  Force fluids AZO over the counter X2 days RTO if symptoms worsen or do not improve  Culture pending - Urinalysis, Complete; Future - Urine Culture; Future - cephALEXin (KEFLEX) 500 MG capsule; Take 1 capsule (500 mg total) by mouth 2 (two) times daily.  Dispense: 14 capsule; Refill: 0     I discussed the assessment and treatment plan with the patient. The patient was provided an opportunity to ask questions and all were answered. The patient agreed with the plan and demonstrated an understanding of the instructions.   The patient was advised to call back or seek an in-person evaluation if the symptoms worsen or if the condition fails to improve as anticipated.  The above assessment and management plan was discussed with the patient. The patient verbalized understanding of and has agreed to the management plan. Patient is aware to call the clinic if symptoms persist or worsen. Patient is aware when to return to the clinic for a follow-up visit. Patient educated on when it is appropriate to go to the emergency department.   Time call ended:  2:55 pm  I provided 7 minutes of non-face-to-face time during this encounter.    Evelina Dun, FNP

## 2020-03-31 LAB — URINE CULTURE

## 2020-04-08 ENCOUNTER — Encounter: Payer: Self-pay | Admitting: Family Medicine

## 2020-04-08 ENCOUNTER — Ambulatory Visit: Payer: BC Managed Care – PPO | Admitting: Family Medicine

## 2020-04-08 ENCOUNTER — Other Ambulatory Visit: Payer: Self-pay

## 2020-04-08 VITALS — BP 131/71 | HR 75 | Temp 98.1°F | Ht 63.0 in | Wt 193.0 lb

## 2020-04-08 DIAGNOSIS — Z85850 Personal history of malignant neoplasm of thyroid: Secondary | ICD-10-CM | POA: Diagnosis not present

## 2020-04-08 DIAGNOSIS — Z1211 Encounter for screening for malignant neoplasm of colon: Secondary | ICD-10-CM

## 2020-04-08 DIAGNOSIS — Z800 Family history of malignant neoplasm of digestive organs: Secondary | ICD-10-CM | POA: Diagnosis not present

## 2020-04-08 DIAGNOSIS — E890 Postprocedural hypothyroidism: Secondary | ICD-10-CM

## 2020-04-08 DIAGNOSIS — R739 Hyperglycemia, unspecified: Secondary | ICD-10-CM

## 2020-04-08 DIAGNOSIS — I10 Essential (primary) hypertension: Secondary | ICD-10-CM

## 2020-04-08 DIAGNOSIS — B373 Candidiasis of vulva and vagina: Secondary | ICD-10-CM

## 2020-04-08 DIAGNOSIS — F5101 Primary insomnia: Secondary | ICD-10-CM

## 2020-04-08 DIAGNOSIS — B3731 Acute candidiasis of vulva and vagina: Secondary | ICD-10-CM

## 2020-04-08 DIAGNOSIS — E039 Hypothyroidism, unspecified: Secondary | ICD-10-CM | POA: Diagnosis not present

## 2020-04-08 LAB — BAYER DCA HB A1C WAIVED: HB A1C (BAYER DCA - WAIVED): 5.7 % (ref <7.0)

## 2020-04-08 MED ORDER — TRAZODONE HCL 50 MG PO TABS: 25.0000 mg | ORAL_TABLET | Freq: Every evening | ORAL | 3 refills | Status: DC | PRN

## 2020-04-08 MED ORDER — LISINOPRIL 20 MG PO TABS: 10.0000 mg | ORAL_TABLET | Freq: Every day | ORAL | 3 refills | Status: DC

## 2020-04-08 MED ORDER — FLUCONAZOLE 150 MG PO TABS: 150.0000 mg | ORAL_TABLET | Freq: Once | ORAL | 0 refills | Status: AC

## 2020-04-08 NOTE — Progress Notes (Signed)
Subjective: CC: Establish care, hypothyroidism PCP: Janora Norlander, DO VZD:GLOVF Ammar is a 54 y.o. female presenting to clinic today for:  1.  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  She is over 15 years cancer free and therefore was released by her endocrinologist.  She was always told to "keep the TSH low" to prevent recurrence.  She is compliant with her Synthroid 175 mcg, which was a reduction from previous dose due to overly suppressed TSH.  She does report some fatigue.  Does not report any change in voice, difficulty swallowing, heart palpitations or tremor.  She does report insomnia.  She tried various OTC remedies including essential oils, lavender, melatonin.  She worries about taking any prescription medicine as she has had quite a scare with Ambien previously.  She notes she woke up in her car.  2.  Hypertension Patient reports compliance with 10 mg of lisinopril daily.  Does not report any chest pain, shortness of breath or visual disturbance.  3.  Family history of colon cancer, colonic polyps Patient reports that her brother had colon cancer in her sister, who is younger than her has colonic polyps.  She has never had a colonoscopy.  Denies any unplanned weight loss, night sweats, hematochezia or melena.  No change in appetite or reports of early satiety.  She would like to see Dr. Earlean Shawl for colonoscopy.  4.  Vaginitis Patient reports vaginal discharge and itching status post treatment with Keflex.  She would like to have a Diflucan sent to pharmacy   ROS: Per HPI  Allergies  Allergen Reactions  . Pollen Extract    Past Medical History:  Diagnosis Date  . Cancer ([place]) 2000   thryoid papillary carcinoma  . COVID-19 12/03/2019   body aches, joint pain, fever 104 x 4 days, then symptoms resolved  . History of kidney stones   . Hyperlipidemia   . Hypertension   .  PONV (postoperative nausea and vomiting)    n/v after appendectomy 5-6 yrs ago  . Sleep apnea    uses cpap  . Thyroid disease    thyroidectomy due to cancer    Current Outpatient Medications:  .  amLODipine (NORVASC) 10 MG tablet, TAKE 1 TABLET BY MOUTH EVERY DAY (Patient taking differently: at bedtime. ), Disp: 90 tablet, Rfl: 1 .  BIOTIN PO, Take 1 capsule by mouth every evening., Disp: , Rfl:  .  Cholecalciferol (VITAMIN D) 50 MCG (2000 UT) CAPS, Take 1 capsule by mouth every evening., Disp: , Rfl:  .  fluticasone (FLONASE) 50 MCG/ACT nasal spray, SPRAY 2 SPRAYS INTO EACH NOSTRIL EVERY DAY, Disp: 48 mL, Rfl: 1 .  ibuprofen (ADVIL) 200 MG tablet, Take 200 mg by mouth every 6 (six) hours as needed. Takes 2 prn q 4-6 hours, Disp: , Rfl:  .  levothyroxine (SYNTHROID) 175 MCG tablet, TAKE 1 TABLET BY MOUTH EVERY DAY, Disp: 30 tablet, Rfl: 0 .  lisinopril (ZESTRIL) 20 MG tablet, TAKE 0.5 TABLETS (10 MG TOTAL) BY MOUTH DAILY. (NEED TO BE SEEN) (Patient taking differently: at bedtime. ), Disp: 45 tablet, Rfl: 3 .  magnesium oxide (MAG-OX) 400 MG tablet, Take 400 mg by mouth daily., Disp: , Rfl:  .  metoprolol succinate (TOPROL-XL) 100 MG 24 hr tablet, Take 1 tablet (100 mg total) by mouth daily. Take with or immediately following a meal (Patient taking differently: Take 100 mg by mouth at bedtime. Take with  or immediately following a meal), Disp: 90 tablet, Rfl: 1 .  Multiple Vitamins-Minerals (MULTIVITAMIN ADULTS) TABS, Take 1 tablet by mouth at bedtime. , Disp: , Rfl:  .  TURMERIC PO, Take 1 capsule by mouth at bedtime. , Disp: , Rfl:  .  fluconazole (DIFLUCAN) 150 MG tablet, Take 1 tablet (150 mg total) by mouth once for 1 dose. Repeat in 3 days if needed, Disp: 2 tablet, Rfl: 0 .  traZODone (DESYREL) 50 MG tablet, Take 0.5-2 tablets (25-100 mg total) by mouth at bedtime as needed for sleep., Disp: 60 tablet, Rfl: 3 Social History   Socioeconomic History  . Marital status: Widowed    Spouse  name: Not on file  . Number of children: Not on file  . Years of education: Not on file  . Highest education level: Not on file  Occupational History  . Not on file  Tobacco Use  . Smoking status: Never Smoker  . Smokeless tobacco: Never Used  Vaping Use  . Vaping Use: Never used  Substance and Sexual Activity  . Alcohol use: No  . Drug use: No  . Sexual activity: Not on file  Other Topics Concern  . Not on file  Social History Narrative  . Not on file   Social Determinants of Health   Financial Resource Strain:   . Difficulty of Paying Living Expenses:   Food Insecurity:   . Worried About Charity fundraiser in the Last Year:   . Arboriculturist in the Last Year:   Transportation Needs:   . Film/video editor (Medical):   Marland Kitchen Lack of Transportation (Non-Medical):   Physical Activity:   . Days of Exercise per Week:   . Minutes of Exercise per Session:   Stress:   . Feeling of Stress :   Social Connections:   . Frequency of Communication with Friends and Family:   . Frequency of Social Gatherings with Friends and Family:   . Attends Religious Services:   . Active Member of Clubs or Organizations:   . Attends Archivist Meetings:   Marland Kitchen Marital Status:   Intimate Partner Violence:   . Fear of Current or Ex-Partner:   . Emotionally Abused:   Marland Kitchen Physically Abused:   . Sexually Abused:    Family History  Problem Relation Age of Onset  . Hypertension Mother   . Dementia Mother   . Macular degeneration Mother   . Stroke Mother   . COPD Father        lung cancer  . Cancer Brother        colon cancer  . Cancer Brother        brain tumor    Objective: Office vital signs reviewed. BP (!) 131/71   Pulse 75   Temp 98.1 F (36.7 C)   Ht 5\' 3"  (1.6 m)   Wt 193 lb (87.5 kg)   LMP 09/07/2016 (Exact Date)   SpO2 96%   BMI 34.19 kg/m   Physical Examination:  General: Awake, alert, well appearing female, No acute distress HEENT: Normal; sclera white.   Well-healed postsurgical scar at the base of her neck.  No palpable neck masses or lymphadenopathy Cardio: regular rate and rhythm, S1S2 heard, no murmurs appreciated Pulm: clear to auscultation bilaterally, no wheezes, rhonchi or rales; normal work of breathing on room air Skin: dry; intact; no rashes or lesions; normal temperature Neuro: No tremor  Assessment/ Plan: 54 y.o. female   1. Postoperative  hypothyroidism She will come in in 2 weeks.  She is currently taking biotin.  I recommended that she stop the biotin for 2 weeks before we check her labs.  I will reach out to endocrinology for opinion on what the threshold should be to change thyroid medication.  All she knew is that she should "have a low TSH in efforts to prevent recurrence".  Her last several TSH values were below the low end of normal.  I would rather not induce a hyperthyroidism in this patient - Thyroid Panel With TSH; Future  2. History of papillary adenocarcinoma of thyroid  3. Elevated blood sugar - Bayer DCA Hb A1c Waived; Future  4. Family history of colon cancer Certainly needs eval by gastroenterology given strong family history.  Undoubtably will need a colonoscopy as she is not a candidate for Cologuard - Ambulatory referral to Gastroenterology  5. Screen for colon cancer - Ambulatory referral to Gastroenterology  6. Primary insomnia Trial of trazodone.  The passing of her spouse most certainly impacting her ability to sleep.  We talked about antidepressants.  Would specifically consider mirtazapine if we were to start anything as this also helps with sleep.  Alternatively could consider Prozac - trazodone  7. Yeast vaginitis Diflucan.   Orders Placed This Encounter  Procedures  . Bayer DCA Hb A1c Waived    Standing Status:   Future    Standing Expiration Date:   04/08/2021  . Thyroid Panel With TSH    Standing Status:   Future    Standing Expiration Date:   04/08/2021  . Ambulatory referral to  Gastroenterology    Referral Priority:   Routine    Referral Type:   Consultation    Referral Reason:   Specialty Services Required    Number of Visits Requested:   1   Meds ordered this encounter  Medications  . fluconazole (DIFLUCAN) 150 MG tablet    Sig: Take 1 tablet (150 mg total) by mouth once for 1 dose. Repeat in 3 days if needed    Dispense:  2 tablet    Refill:  0  . traZODone (DESYREL) 50 MG tablet    Sig: Take 0.5-2 tablets (25-100 mg total) by mouth at bedtime as needed for sleep.    Dispense:  60 tablet    Refill:  [place], [place] [phone]

## 2020-04-09 LAB — THYROID PANEL WITH TSH
Free Thyroxine Index: 2.3 (ref 1.2–4.9)
T3 Uptake Ratio: 27 % (ref 24–39)
T4, Total: 8.4 ug/dL (ref 4.5–12.0)
TSH: 0.17 u[IU]/mL — ABNORMAL LOW (ref 0.450–4.500)

## 2020-04-11 ENCOUNTER — Telehealth: Payer: Self-pay | Admitting: Family Medicine

## 2020-04-17 ENCOUNTER — Other Ambulatory Visit: Payer: Self-pay | Admitting: Family Medicine

## 2020-04-17 DIAGNOSIS — E039 Hypothyroidism, unspecified: Secondary | ICD-10-CM

## 2020-04-24 ENCOUNTER — Encounter: Payer: Self-pay | Admitting: Family Medicine

## 2020-04-29 ENCOUNTER — Other Ambulatory Visit: Payer: BC Managed Care – PPO

## 2020-04-29 ENCOUNTER — Other Ambulatory Visit: Payer: Self-pay

## 2020-04-29 DIAGNOSIS — E890 Postprocedural hypothyroidism: Secondary | ICD-10-CM | POA: Diagnosis not present

## 2020-04-29 DIAGNOSIS — R739 Hyperglycemia, unspecified: Secondary | ICD-10-CM

## 2020-04-29 DIAGNOSIS — E039 Hypothyroidism, unspecified: Secondary | ICD-10-CM | POA: Diagnosis not present

## 2020-04-29 LAB — BAYER DCA HB A1C WAIVED: HB A1C (BAYER DCA - WAIVED): 5.8 % (ref <7.0)

## 2020-04-30 ENCOUNTER — Other Ambulatory Visit: Payer: Self-pay | Admitting: Family Medicine

## 2020-04-30 DIAGNOSIS — F5101 Primary insomnia: Secondary | ICD-10-CM

## 2020-04-30 LAB — THYROID PANEL WITH TSH
Free Thyroxine Index: 2.2 (ref 1.2–4.9)
T3 Uptake Ratio: 26 % (ref 24–39)
T4, Total: 8.3 ug/dL (ref 4.5–12.0)
TSH: 0.868 u[IU]/mL (ref 0.450–4.500)

## 2020-05-02 DIAGNOSIS — Z1211 Encounter for screening for malignant neoplasm of colon: Secondary | ICD-10-CM | POA: Diagnosis not present

## 2020-05-02 DIAGNOSIS — Z8371 Family history of colonic polyps: Secondary | ICD-10-CM | POA: Diagnosis not present

## 2020-05-02 DIAGNOSIS — K5900 Constipation, unspecified: Secondary | ICD-10-CM | POA: Diagnosis not present

## 2020-05-02 DIAGNOSIS — Z800 Family history of malignant neoplasm of digestive organs: Secondary | ICD-10-CM | POA: Diagnosis not present

## 2020-05-02 NOTE — Telephone Encounter (Signed)
This is a TRIAL so 90d is not indicated.

## 2020-05-04 ENCOUNTER — Other Ambulatory Visit: Payer: Self-pay | Admitting: Family Medicine

## 2020-05-04 DIAGNOSIS — I10 Essential (primary) hypertension: Secondary | ICD-10-CM

## 2020-05-14 ENCOUNTER — Other Ambulatory Visit: Payer: Self-pay | Admitting: Family Medicine

## 2020-05-14 DIAGNOSIS — E039 Hypothyroidism, unspecified: Secondary | ICD-10-CM

## 2020-05-22 ENCOUNTER — Other Ambulatory Visit: Payer: Self-pay | Admitting: Family Medicine

## 2020-05-23 DIAGNOSIS — G4733 Obstructive sleep apnea (adult) (pediatric): Secondary | ICD-10-CM | POA: Diagnosis not present

## 2020-05-24 ENCOUNTER — Encounter: Payer: Self-pay | Admitting: Family Medicine

## 2020-06-16 DIAGNOSIS — K635 Polyp of colon: Secondary | ICD-10-CM | POA: Diagnosis not present

## 2020-06-16 DIAGNOSIS — Z1211 Encounter for screening for malignant neoplasm of colon: Secondary | ICD-10-CM | POA: Diagnosis not present

## 2020-06-16 DIAGNOSIS — Z800 Family history of malignant neoplasm of digestive organs: Secondary | ICD-10-CM | POA: Diagnosis not present

## 2020-06-16 DIAGNOSIS — Z8371 Family history of colonic polyps: Secondary | ICD-10-CM | POA: Diagnosis not present

## 2020-06-16 DIAGNOSIS — D122 Benign neoplasm of ascending colon: Secondary | ICD-10-CM | POA: Diagnosis not present

## 2020-06-16 DIAGNOSIS — D125 Benign neoplasm of sigmoid colon: Secondary | ICD-10-CM | POA: Diagnosis not present

## 2020-07-14 DIAGNOSIS — J029 Acute pharyngitis, unspecified: Secondary | ICD-10-CM | POA: Diagnosis not present

## 2020-07-15 ENCOUNTER — Other Ambulatory Visit: Payer: Self-pay | Admitting: Family Medicine

## 2020-07-15 DIAGNOSIS — J302 Other seasonal allergic rhinitis: Secondary | ICD-10-CM

## 2020-07-31 ENCOUNTER — Other Ambulatory Visit: Payer: Self-pay | Admitting: Family Medicine

## 2020-07-31 DIAGNOSIS — F5101 Primary insomnia: Secondary | ICD-10-CM

## 2020-08-01 NOTE — Telephone Encounter (Signed)
Please inquire as to what dose patient has been taking of the Trazodone so I can fill appropriately.

## 2020-08-02 NOTE — Telephone Encounter (Signed)
Mailbox full-cb 11/23

## 2020-08-03 DIAGNOSIS — H15101 Unspecified episcleritis, right eye: Secondary | ICD-10-CM | POA: Diagnosis not present

## 2020-09-05 ENCOUNTER — Ambulatory Visit (HOSPITAL_COMMUNITY)
Admission: RE | Admit: 2020-09-05 | Discharge: 2020-09-05 | Disposition: A | Discharge status: Home / Self Care | Payer: BC Managed Care – PPO | Source: Ambulatory Visit | Attending: Family Medicine | Admitting: Family Medicine

## 2020-09-05 ENCOUNTER — Other Ambulatory Visit: Payer: Self-pay

## 2020-09-05 ENCOUNTER — Ambulatory Visit (INDEPENDENT_AMBULATORY_CARE_PROVIDER_SITE_OTHER): Payer: BC Managed Care – PPO | Admitting: Family Medicine

## 2020-09-05 VITALS — BP 118/66 | HR 73 | Temp 98.2°F | Ht 63.0 in | Wt 208.0 lb

## 2020-09-05 DIAGNOSIS — N200 Calculus of kidney: Secondary | ICD-10-CM | POA: Diagnosis not present

## 2020-09-05 DIAGNOSIS — Z8619 Personal history of other infectious and parasitic diseases: Secondary | ICD-10-CM | POA: Diagnosis not present

## 2020-09-05 DIAGNOSIS — Z87442 Personal history of urinary calculi: Secondary | ICD-10-CM | POA: Insufficient documentation

## 2020-09-05 DIAGNOSIS — R109 Unspecified abdominal pain: Secondary | ICD-10-CM

## 2020-09-05 DIAGNOSIS — R399 Unspecified symptoms and signs involving the genitourinary system: Secondary | ICD-10-CM | POA: Diagnosis not present

## 2020-09-05 DIAGNOSIS — M47816 Spondylosis without myelopathy or radiculopathy, lumbar region: Secondary | ICD-10-CM | POA: Diagnosis not present

## 2020-09-05 DIAGNOSIS — I700 Atherosclerosis of aorta: Secondary | ICD-10-CM | POA: Diagnosis not present

## 2020-09-05 DIAGNOSIS — K760 Fatty (change of) liver, not elsewhere classified: Secondary | ICD-10-CM | POA: Diagnosis not present

## 2020-09-05 LAB — URINALYSIS, COMPLETE
Bilirubin, UA: NEGATIVE
Glucose, UA: NEGATIVE
Ketones, UA: NEGATIVE
Leukocytes,UA: NEGATIVE
Nitrite, UA: NEGATIVE
Protein,UA: NEGATIVE
Specific Gravity, UA: 1.025 (ref 1.005–1.030)
Urobilinogen, Ur: 0.2 mg/dL (ref 0.2–1.0)
pH, UA: 7 (ref 5.0–7.5)

## 2020-09-05 LAB — MICROSCOPIC EXAMINATION: WBC, UA: NONE SEEN /hpf (ref 0–5)

## 2020-09-05 IMAGING — CT CT RENAL STONE PROTOCOL
2 of 7 series · 14 of 46 positions shown, 19 images · non-contrast
Comparison: [DATE]

CLINICAL DATA: Left-sided abdominal pain

EXAM:
CT ABDOMEN AND PELVIS WITHOUT CONTRAST
TECHNIQUE: Multidetector CT imaging of the abdomen and pelvis was performed
following the standard protocol without IV contrast.

[Series 2: axial st · axial · 0.84mm/px · z∈[-615,-265]mm · 11 of 80 slices shown, 16 images]
[im 5/80  soft-tissue]
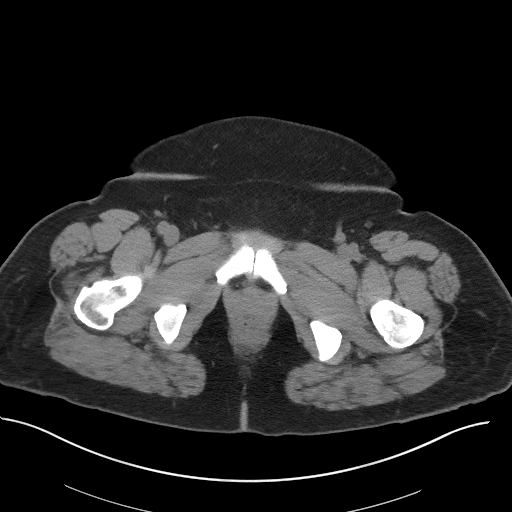
[im 5/80  bone]
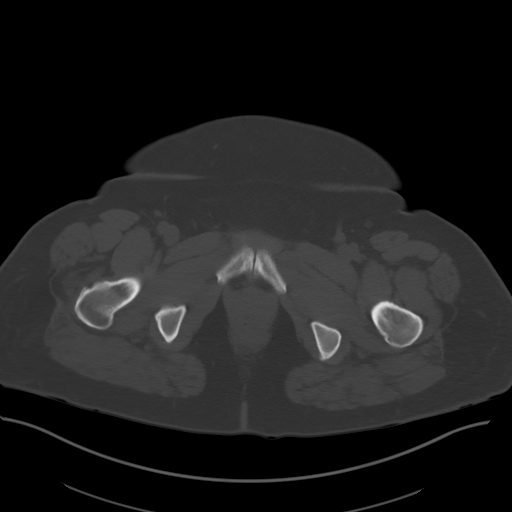
[im 15/80  soft-tissue]
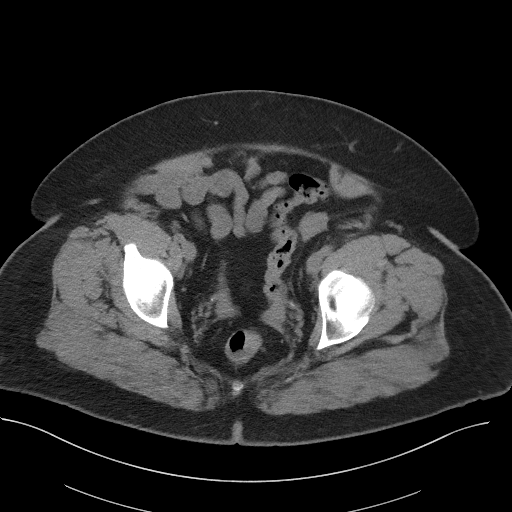
[im 20/80  soft-tissue]
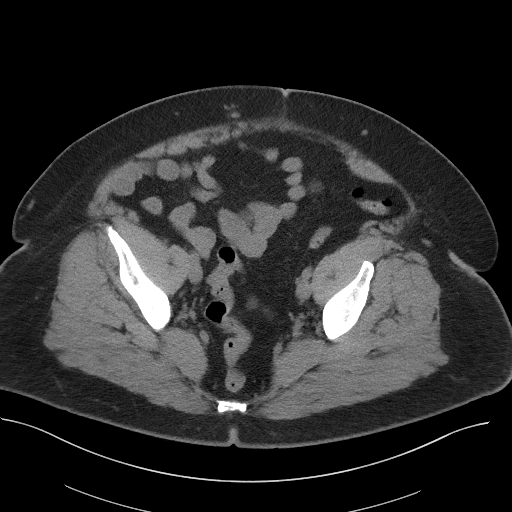
[im 30/80  soft-tissue]
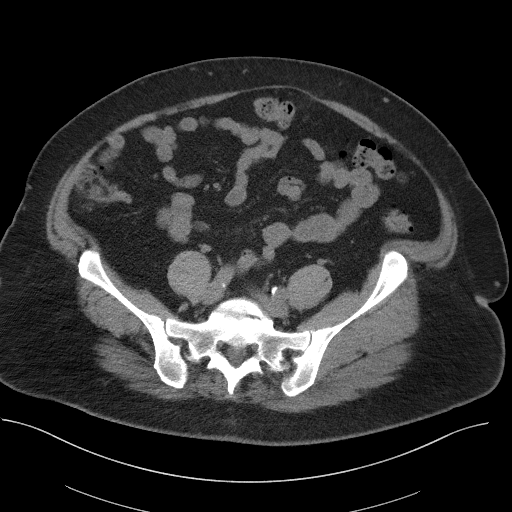
[im 35/80  soft-tissue]
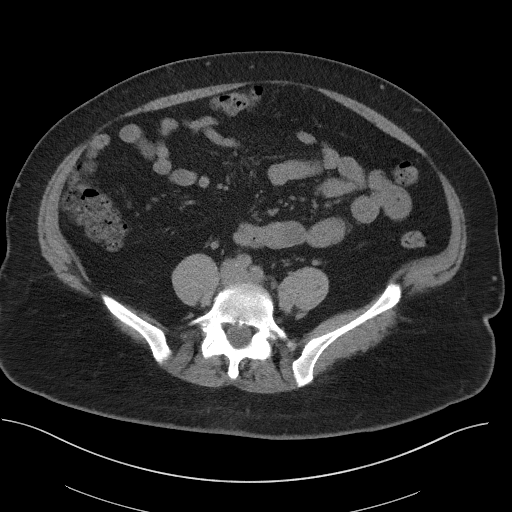
[im 45/80  soft-tissue]
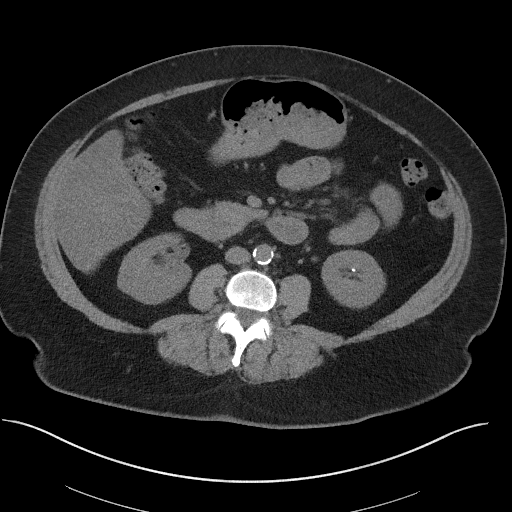
[im 50/80  soft-tissue]
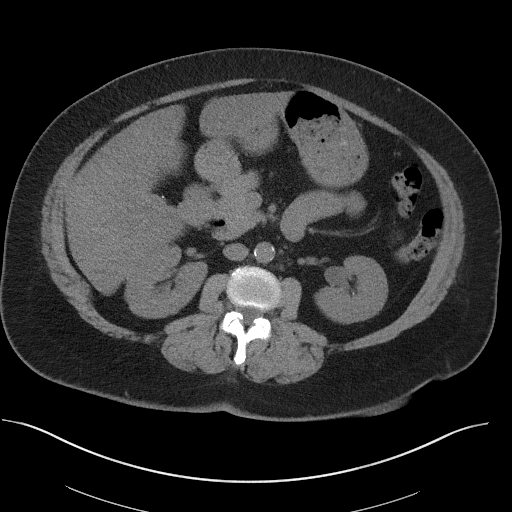
[im 60/80  soft-tissue]
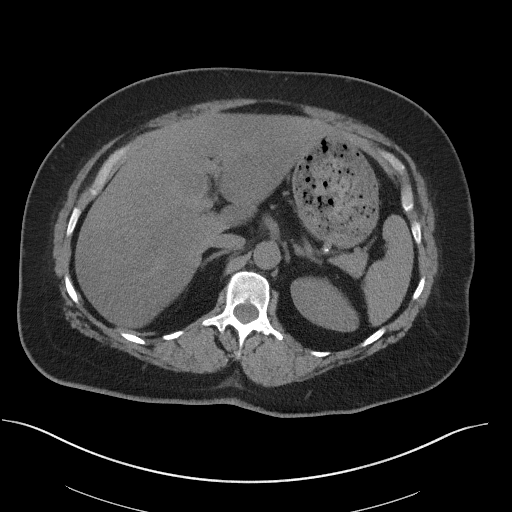
[im 60/80  lung]
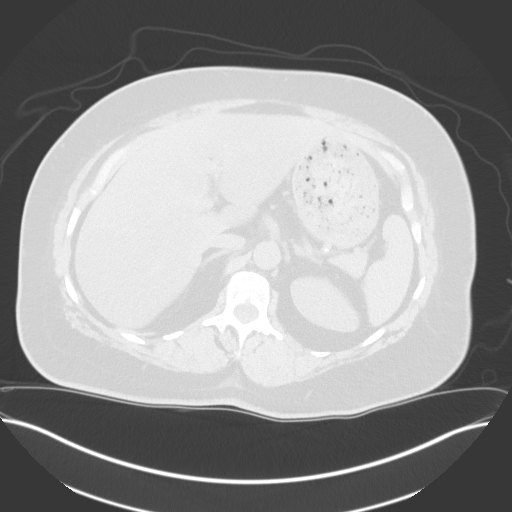
[im 65/80  soft-tissue]
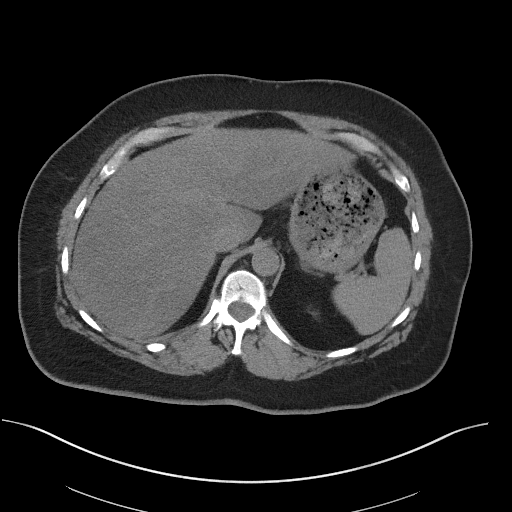
[im 65/80  lung]
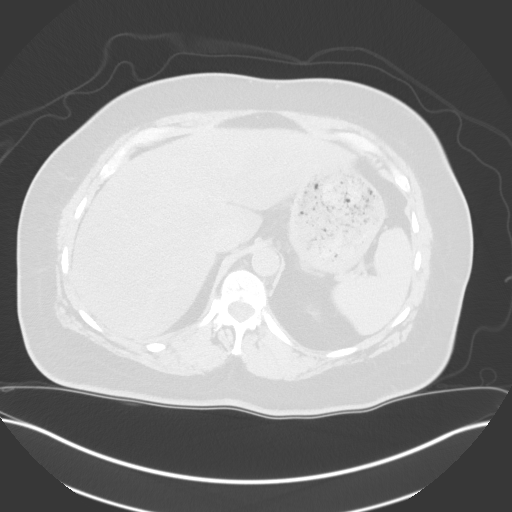
[im 65/80  bone]
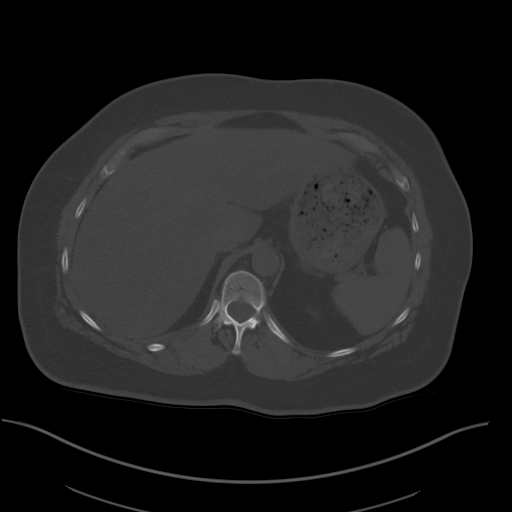
[im 70/80  lung]
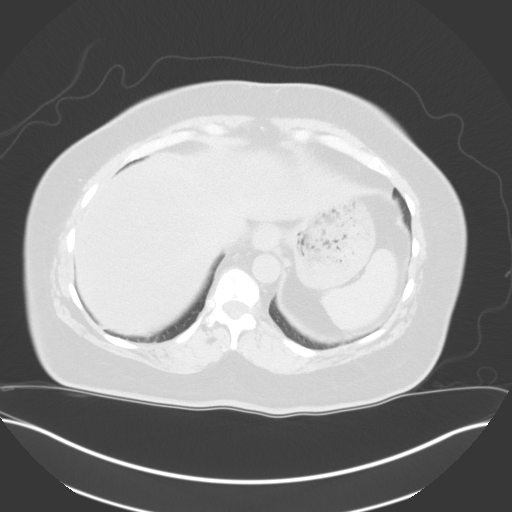
[im 75/80  soft-tissue]
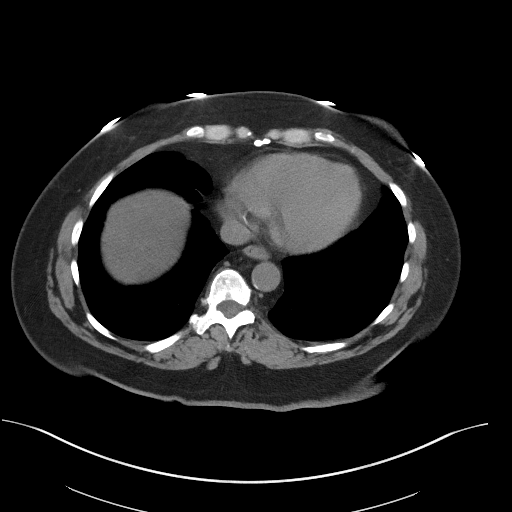
[im 75/80  lung]
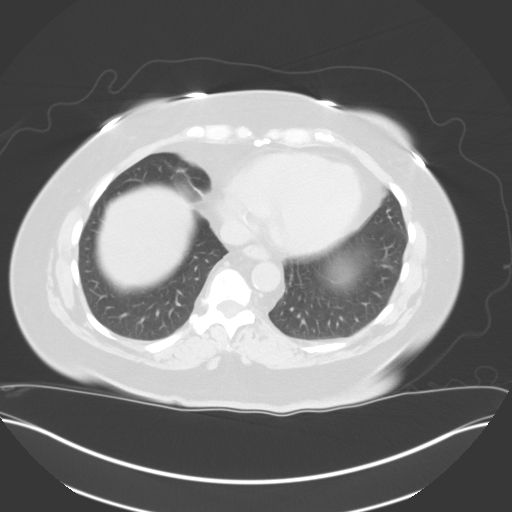

[Series 5: coronal st · coronal · 0.81mm/px · 3 of 114 slices shown]
[im 29/114  soft-tissue]
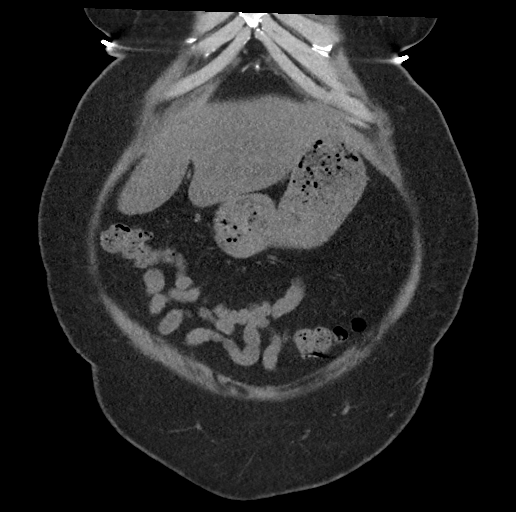
[im 57/114  soft-tissue]
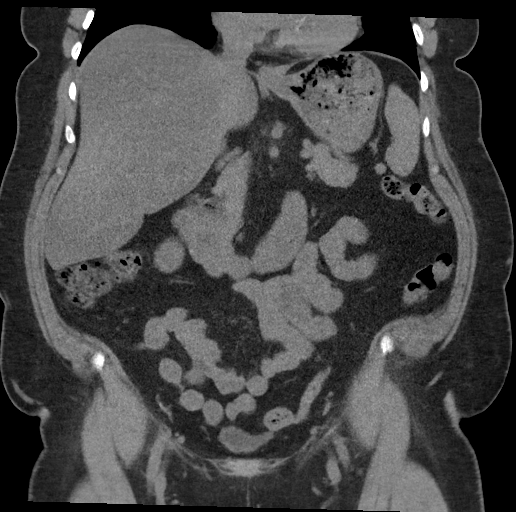
[im 85/114  soft-tissue]
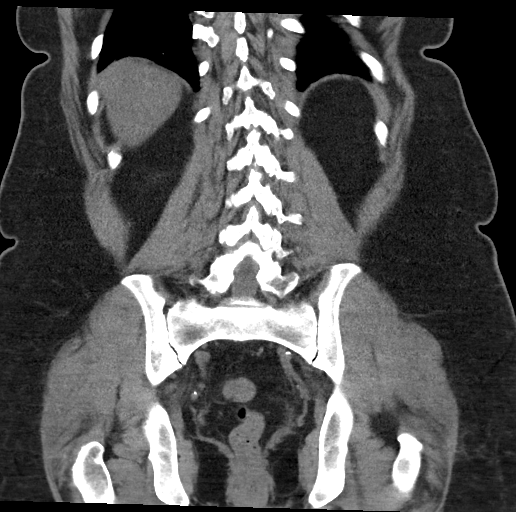

[14 of 46 positions shown; findings below may reference images not displayed]

FINDINGS: Lower chest: No acute abnormality.

Hepatobiliary: Hepatic steatosis. Cholecystectomy. No biliary
dilatation.

Pancreas: Unremarkable

Spleen: Unremarkable.

Adrenals/Urinary Tract: Adrenals are unremarkable. No
hydronephrosis. Bilateral nonobstructing renal calculi measuring up
to 4 mm. No ureteral calculi. Bladder is not well distended but
otherwise unremarkable.

Stomach/Bowel: Stomach is within normal limits. Incidental note is
made of a diverticulum duodenum. Bowel is normal in caliber.

Vascular/Lymphatic: Aortic atherosclerosis. No enlarged lymph nodes.

Reproductive: Status post hysterectomy. No adnexal masses.

Other: No ascites.  Abdominal wall is unremarkable.

Musculoskeletal: Lumbar spine degenerative changes. No acute osseous
abnormality.
IMPRESSION: No obstructing calculus or hydronephrosis. Bilateral nonobstructing
renal calculi.

Hepatic steatosis.

Aortic Atherosclerosis ([6X]-[6X]).

## 2020-09-05 NOTE — Progress Notes (Signed)
Subjective: CC:UTI PCP: Raliegh Ip, DO DDU:KGURK Ann Peterson is a 54 y.o. female presenting to clinic today for:  1. Urinary symptoms Patient reports a 3 day h/o left flank pain and urinary frequency.denies urgency, hematuria, fevers, chills, abdominal pain, nausea, vomiting, vaginal discharge.  Medical history is significant for urosepsis secondary to renal stone in the past.  She is followed by Dr. Ronne Binning but has not reached out to them regarding today's symptoms.  She was worried that this might be infection versus stone.   ROS: Per HPI  Allergies  Allergen Reactions   Pollen Extract    Past Medical History:  Diagnosis Date   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   COVID-19 12/03/2019   body aches, joint pain, fever 104 x 4 days, then symptoms resolved   History of kidney stones    Hyperlipidemia    Hypertension    PONV (postoperative nausea and vomiting)    n/v after appendectomy 5-6 yrs ago   Sleep apnea    uses cpap   Thyroid disease    thyroidectomy due to cancer    Current Outpatient Medications:    amLODipine (NORVASC) 10 MG tablet, TAKE 1 TABLET BY MOUTH EVERY DAY, Disp: 90 tablet, Rfl: 1   BIOTIN PO, Take 1 capsule by mouth every evening., Disp: , Rfl:    Cholecalciferol (VITAMIN D) 50 MCG (2000 UT) CAPS, Take 1 capsule by mouth every evening., Disp: , Rfl:    fluticasone (FLONASE) 50 MCG/ACT nasal spray, SPRAY 2 SPRAYS INTO EACH NOSTRIL EVERY DAY, Disp: 48 mL, Rfl: 1   ibuprofen (ADVIL) 200 MG tablet, Take 200 mg by mouth every 6 (six) hours as needed. Takes 2 prn q 4-6 hours, Disp: , Rfl:    levothyroxine (SYNTHROID) 175 MCG tablet, TAKE 1 TABLET BY MOUTH EVERY DAY, Disp: 30 tablet, Rfl: 11   lisinopril (ZESTRIL) 20 MG tablet, Take 0.5 tablets (10 mg total) by mouth daily., Disp: 45 tablet, Rfl: 3   magnesium oxide (MAG-OX) 400 MG tablet, Take 400 mg by mouth daily., Disp: , Rfl:    metoprolol succinate (TOPROL-XL) 100 MG 24  hr tablet, TAKE 1 TABLET (100 MG TOTAL) BY MOUTH DAILY. TAKE WITH OR IMMEDIATELY FOLLOWING A MEAL, Disp: 90 tablet, Rfl: 1   Multiple Vitamins-Minerals (MULTIVITAMIN ADULTS) TABS, Take 1 tablet by mouth at bedtime. , Disp: , Rfl:    traZODone (DESYREL) 50 MG tablet, TAKE 0.5-2 TABLETS (25-100 MG TOTAL) BY MOUTH AT BEDTIME AS NEEDED FOR SLEEP., Disp: 180 tablet, Rfl: 0   TURMERIC PO, Take 1 capsule by mouth at bedtime. , Disp: , Rfl:  Social History   Socioeconomic History   Marital status: Widowed    Spouse name: Not on file   Number of children: Not on file   Years of education: Not on file   Highest education level: Not on file  Occupational History   Not on file  Tobacco Use   Smoking status: Never Smoker   Smokeless tobacco: Never Used  Vaping Use   Vaping Use: Never used  Substance and Sexual Activity   Alcohol use: No   Drug use: No   Sexual activity: Not on file  Other Topics Concern   Not on file  Social History Narrative   Not on file   Social Determinants of Health   Financial Resource Strain: Not on file  Food Insecurity: Not on file  Transportation Needs: Not on file  Physical Activity: Not on file  Stress: Not on file  Social Connections: Not on file  Intimate Partner Violence: Not on file   Family History  Problem Relation Age of Onset   Hypertension Mother    Dementia Mother    Macular degeneration Mother    Stroke Mother    COPD Father        lung cancer   Cancer Brother        colon cancer   Cancer Brother        brain tumor    Objective: Office vital signs reviewed. BP 118/66    Pulse 73    Temp 98.2 F (36.8 C) (Temporal)    Ht 5\' 3"  (1.6 m)    Wt 208 lb (94.3 kg)    LMP 09/07/2016 (Exact Date)    SpO2 98%    BMI 36.85 kg/m   Physical Examination:  General: Awake, alert, well nourished, well appearing female. No acute distress GU: No suprapubic tenderness to palpation; +left sided CVA TTP Assessment/ Plan: 54 y.o.  female   Left flank pain - Plan: Urinalysis, Complete, CT RENAL STONE STUDY, Urine Culture  History of renal stone - Plan: CT RENAL STONE STUDY, Urine Culture  History of sepsis - Plan: CT RENAL STONE STUDY, Urine Culture  Urinalysis showed trace blood but was negative for leukocytes and nitrite.  I have ordered a stat CT renal stone study and added a urine culture despite lack of evidence to suggest infection on her urine.  Given her history of urosepsis in the past, I think important to proceed with more aggressive work-up.  Plan pending CT renal stone result. Will cc to urologist.  No orders of the defined types were placed in this encounter.  No orders of the defined types were placed in this encounter.    41, DO [facility] [phone]

## 2020-09-06 ENCOUNTER — Other Ambulatory Visit: Payer: Self-pay | Admitting: Family Medicine

## 2020-09-06 ENCOUNTER — Telehealth: Payer: Self-pay | Admitting: Urology

## 2020-09-06 MED ORDER — DICLOFENAC SODIUM 75 MG PO TBEC: 75.0000 mg | DELAYED_RELEASE_TABLET | Freq: Two times a day (BID) | ORAL | 0 refills | Status: DC | PRN

## 2020-09-06 MED ORDER — CEPHALEXIN 500 MG PO CAPS: 500.0000 mg | ORAL_CAPSULE | Freq: Two times a day (BID) | ORAL | 0 refills | Status: AC

## 2020-09-06 NOTE — Progress Notes (Signed)
Patient aware and expressed understanding

## 2020-09-06 NOTE — Telephone Encounter (Signed)
Pt called and LM because she has unbearable low back pain, She states she was a pt of Dr Macario Carls at [facility] and he has put a stent in before.

## 2020-09-06 NOTE — Progress Notes (Signed)
Attempted to reach patient but VM was full and no VM could be left.  Spoke with Dr Ronne Binning and he recommends empirically treating for UTI and following up with him for renal stones.  Keflex sent to pharmacy.  Please inform pt  Ann Peterson M. Nadine Counts, DO [facility]

## 2020-09-07 LAB — URINE CULTURE

## 2020-09-14 NOTE — Telephone Encounter (Signed)
CT showed bilateral renal calculi but no obstructing calculi. I would have her treated for a UTI

## 2020-09-14 NOTE — Telephone Encounter (Signed)
Left message to call back  

## 2020-10-06 NOTE — Telephone Encounter (Signed)
See 12/28 note from [facility].

## 2020-11-02 ENCOUNTER — Other Ambulatory Visit: Payer: Self-pay | Admitting: Family Medicine

## 2020-11-02 DIAGNOSIS — I10 Essential (primary) hypertension: Secondary | ICD-10-CM

## 2020-12-01 ENCOUNTER — Other Ambulatory Visit: Payer: Self-pay | Admitting: Family Medicine

## 2020-12-03 ENCOUNTER — Other Ambulatory Visit: Payer: Self-pay | Admitting: Family Medicine

## 2020-12-03 DIAGNOSIS — I10 Essential (primary) hypertension: Secondary | ICD-10-CM

## 2020-12-05 NOTE — Telephone Encounter (Signed)
[place]. NTBS 30 days given 11/02/20

## 2020-12-11 ENCOUNTER — Other Ambulatory Visit: Payer: Self-pay | Admitting: Family Medicine

## 2020-12-11 DIAGNOSIS — I10 Essential (primary) hypertension: Secondary | ICD-10-CM

## 2020-12-11 DIAGNOSIS — J302 Other seasonal allergic rhinitis: Secondary | ICD-10-CM

## 2020-12-28 DIAGNOSIS — M545 Low back pain, unspecified: Secondary | ICD-10-CM | POA: Diagnosis not present

## 2020-12-28 DIAGNOSIS — I10 Essential (primary) hypertension: Secondary | ICD-10-CM | POA: Diagnosis not present

## 2020-12-28 DIAGNOSIS — Z6835 Body mass index (BMI) 35.0-35.9, adult: Secondary | ICD-10-CM | POA: Diagnosis not present

## 2020-12-29 ENCOUNTER — Other Ambulatory Visit: Payer: Self-pay | Admitting: Podiatry

## 2020-12-29 MED ORDER — METHYLPREDNISOLONE 4 MG PO TBPK: ORAL_TABLET | ORAL | 0 refills | Status: DC

## 2020-12-29 NOTE — Progress Notes (Signed)
PRN RT pain

## 2021-01-03 ENCOUNTER — Other Ambulatory Visit: Payer: Self-pay | Admitting: Family Medicine

## 2021-01-03 NOTE — Telephone Encounter (Signed)
[place]. NTBS 30 days given 12/01/20

## 2021-01-03 NOTE — Telephone Encounter (Signed)
No answer, no vm available.

## 2021-01-05 ENCOUNTER — Other Ambulatory Visit: Payer: Self-pay | Admitting: Family Medicine

## 2021-01-05 DIAGNOSIS — I10 Essential (primary) hypertension: Secondary | ICD-10-CM

## 2021-01-19 ENCOUNTER — Other Ambulatory Visit: Payer: Self-pay | Admitting: Family Medicine

## 2021-01-19 DIAGNOSIS — I10 Essential (primary) hypertension: Secondary | ICD-10-CM

## 2021-01-20 ENCOUNTER — Telehealth: Payer: Self-pay

## 2021-01-20 ENCOUNTER — Other Ambulatory Visit: Payer: Self-pay

## 2021-01-20 DIAGNOSIS — I10 Essential (primary) hypertension: Secondary | ICD-10-CM

## 2021-01-20 MED ORDER — AMLODIPINE BESYLATE 10 MG PO TABS: 10.0000 mg | ORAL_TABLET | Freq: Every day | ORAL | 0 refills | Status: DC

## 2021-01-20 MED ORDER — LISINOPRIL 20 MG PO TABS
ORAL_TABLET | ORAL | 1 refills | Status: DC
Start: 2021-01-20 — End: 2021-03-08

## 2021-01-20 MED ORDER — METOPROLOL SUCCINATE ER 100 MG PO TB24: 100.0000 mg | ORAL_TABLET | Freq: Every day | ORAL | 1 refills | Status: DC

## 2021-01-20 NOTE — Telephone Encounter (Signed)
  Prescription Request  01/20/2021  What is the name of the medication or equipment?  metoprolol succinate (TOPROL-XL) 100 MG 24 hr tablet lisinopril (ZESTRIL) 20 MG tablet  Have you contacted your pharmacy to request a refill? (if applicable) yes ntbs. She scheduled next available for 03/07/2021 [place] would you like this sent to? [facility]   Patient notified that their request is being sent to the clinical staff for review and that they should receive a response within 2 business days.

## 2021-01-20 NOTE — Telephone Encounter (Signed)
NA - no mailbox - full- [id number]-jhb

## 2021-01-20 NOTE — Telephone Encounter (Signed)
[place]. NTBS 30 day refills given in April

## 2021-02-03 ENCOUNTER — Telehealth: Payer: Self-pay

## 2021-02-03 DIAGNOSIS — N200 Calculus of kidney: Secondary | ICD-10-CM

## 2021-02-05 LAB — URINALYSIS, ROUTINE W REFLEX MICROSCOPIC
Bilirubin, UA: NEGATIVE
Glucose, UA: NEGATIVE
Ketones, UA: NEGATIVE
Nitrite, UA: NEGATIVE
Protein,UA: NEGATIVE
RBC, UA: NEGATIVE
Specific Gravity, UA: 1.017 (ref 1.005–1.030)
Urobilinogen, Ur: 0.2 mg/dL (ref 0.2–1.0)
pH, UA: 6 (ref 5.0–7.5)

## 2021-02-05 LAB — MICROSCOPIC EXAMINATION: Casts: NONE SEEN /lpf

## 2021-02-05 LAB — URINE CULTURE: Organism ID, Bacteria: NO GROWTH

## 2021-02-07 ENCOUNTER — Telehealth: Payer: Self-pay | Admitting: Urology

## 2021-02-07 NOTE — Progress Notes (Signed)
Results sent via my chart 

## 2021-02-07 NOTE — Telephone Encounter (Signed)
Pt called wanting to speak to someone about the results of her urine drop off last week. Please send patient a message through my chart.

## 2021-02-07 NOTE — Telephone Encounter (Signed)
Urine culture results sent via my chart

## 2021-02-13 ENCOUNTER — Other Ambulatory Visit: Payer: Self-pay | Admitting: Family Medicine

## 2021-02-17 ENCOUNTER — Ambulatory Visit (HOSPITAL_COMMUNITY)
Admission: RE | Admit: 2021-02-17 | Discharge: 2021-02-17 | Disposition: A | Discharge status: Home / Self Care | Payer: BC Managed Care – PPO | Source: Ambulatory Visit | Attending: Urology | Admitting: Urology

## 2021-02-17 ENCOUNTER — Other Ambulatory Visit: Payer: Self-pay

## 2021-02-17 DIAGNOSIS — N200 Calculus of kidney: Secondary | ICD-10-CM | POA: Diagnosis not present

## 2021-02-17 DIAGNOSIS — N1330 Unspecified hydronephrosis: Secondary | ICD-10-CM | POA: Diagnosis not present

## 2021-02-17 IMAGING — US US RENAL
1 series · 14 of 25 positions shown · non-contrast
Comparison: [DATE]

CLINICAL DATA: Known nephrolithiasis

EXAM:
RENAL / URINARY TRACT ULTRASOUND COMPLETE

[Series 1: us renal · 14 of 53 slices shown]
[im 1/53]
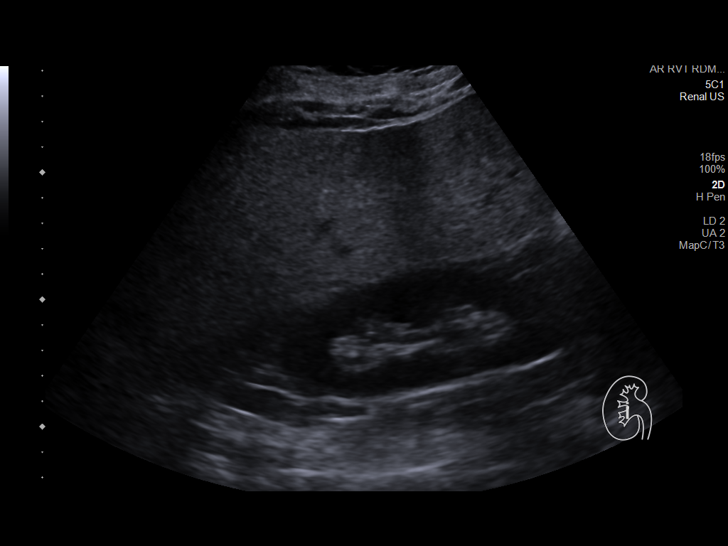
[im 5/53]
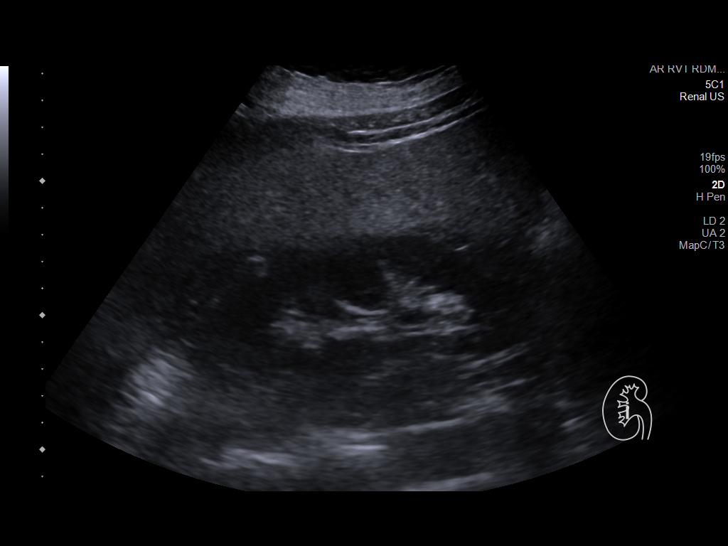
[im 9/53]
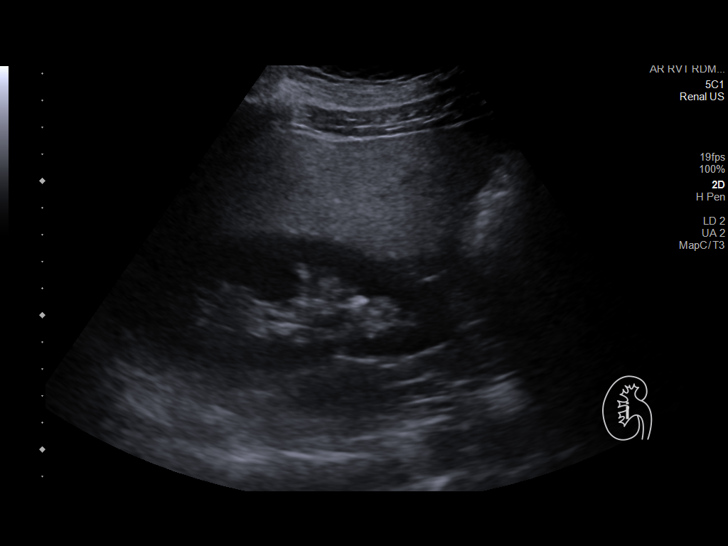
[im 14/53]
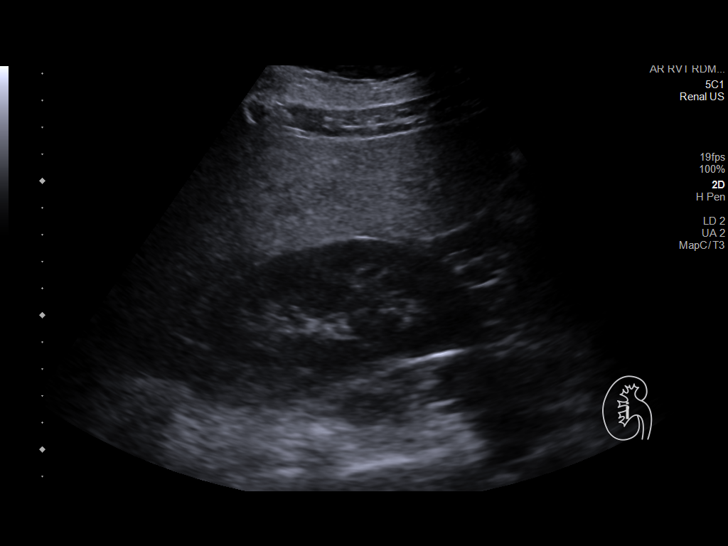
[im 18/53]
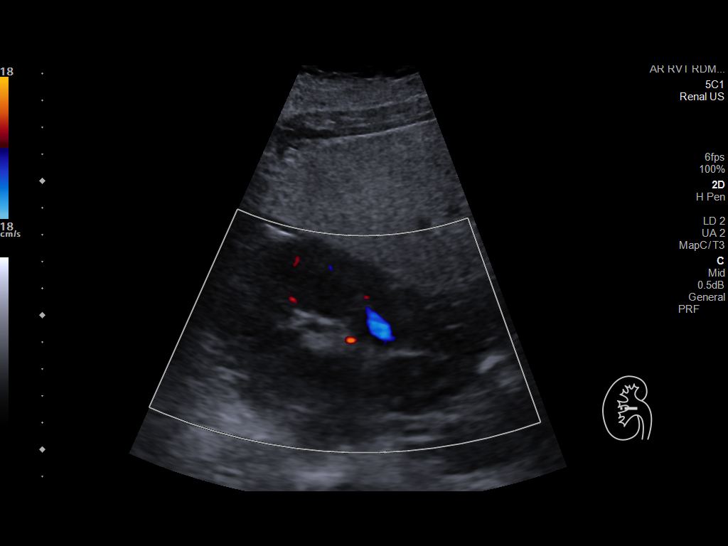
[im 20/53]
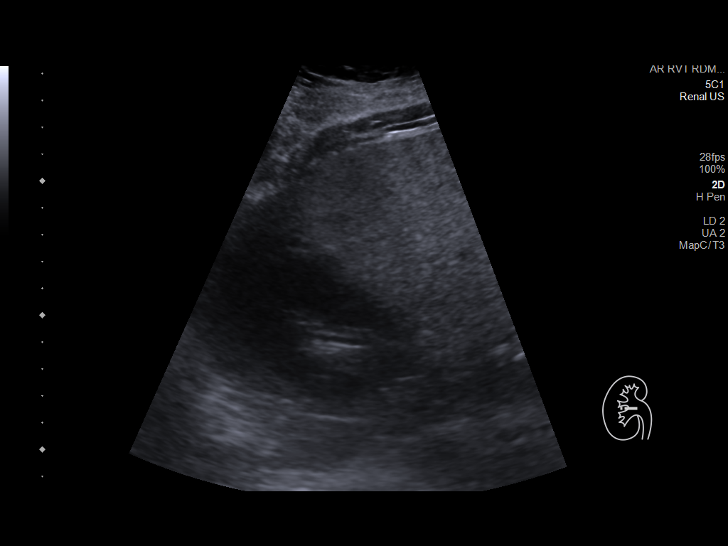
[im 24/53]
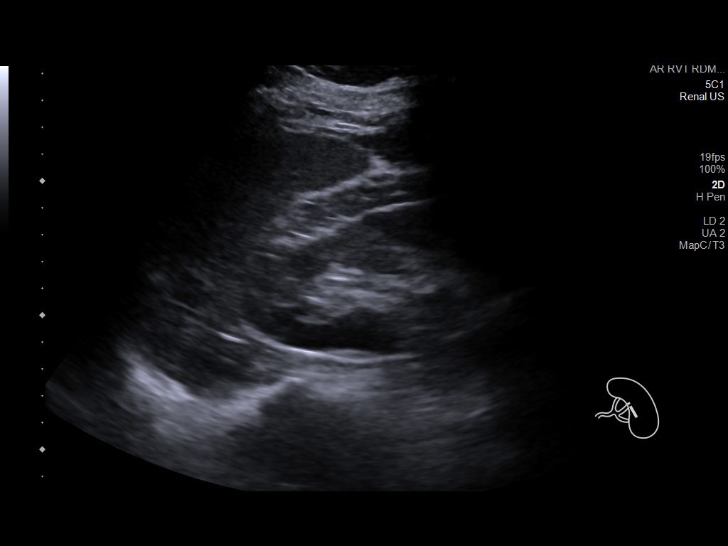
[im 29/53]
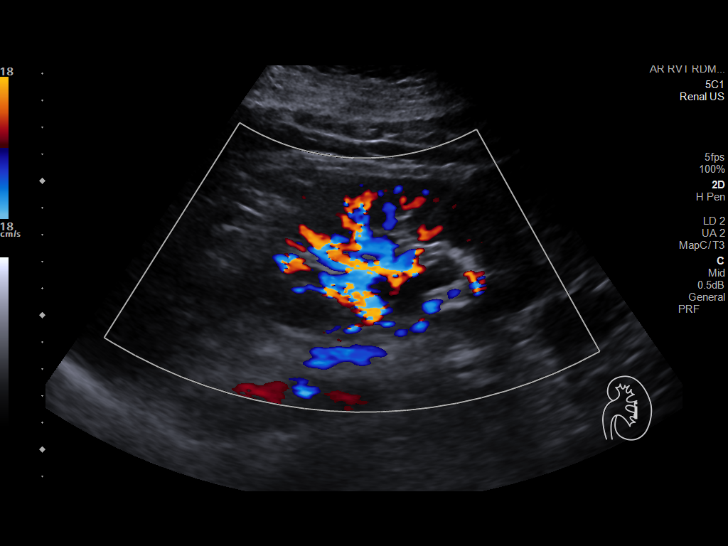
[im 33/53]
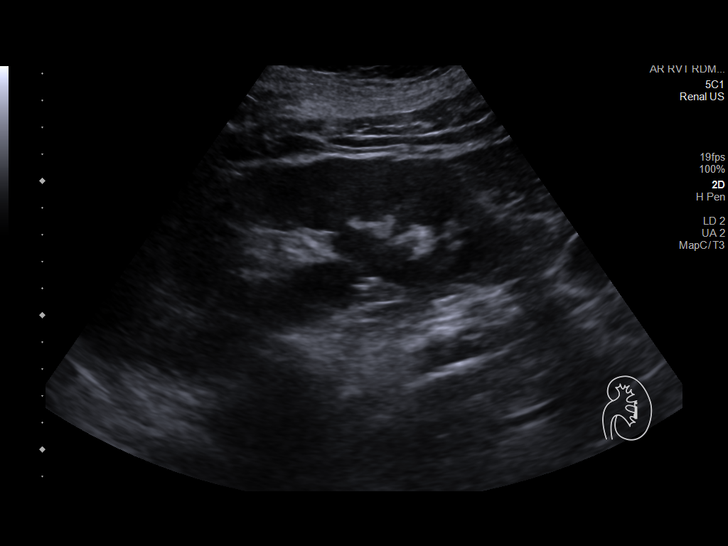
[im 35/53]
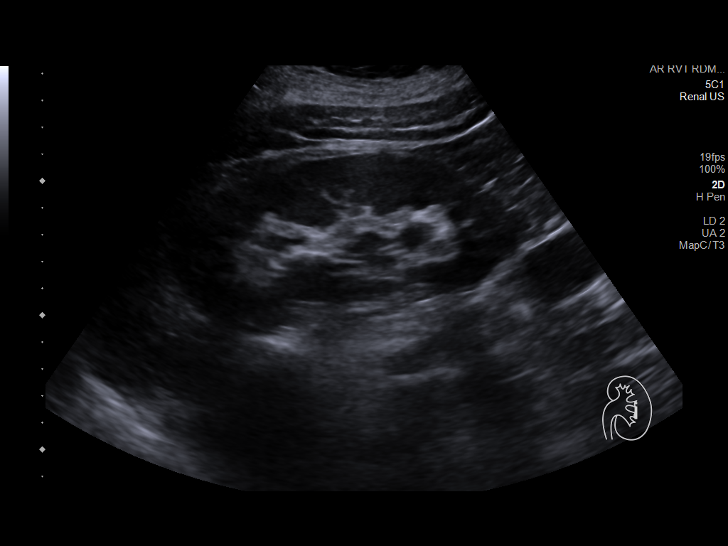
[im 40/53]
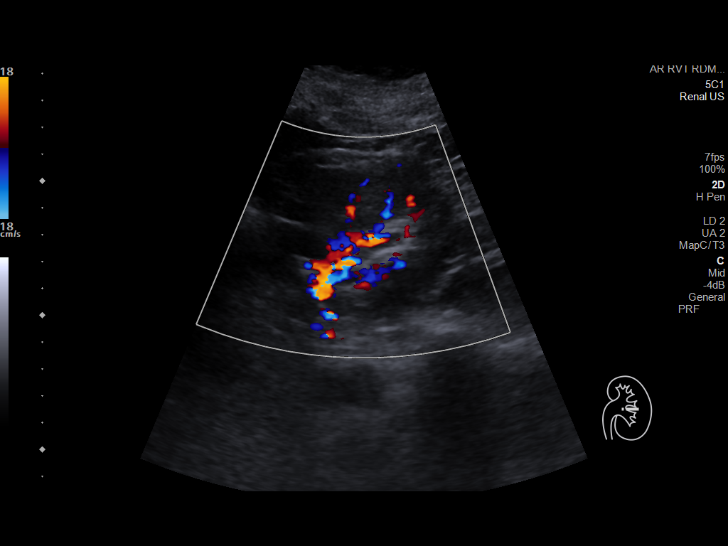
[im 44/53]
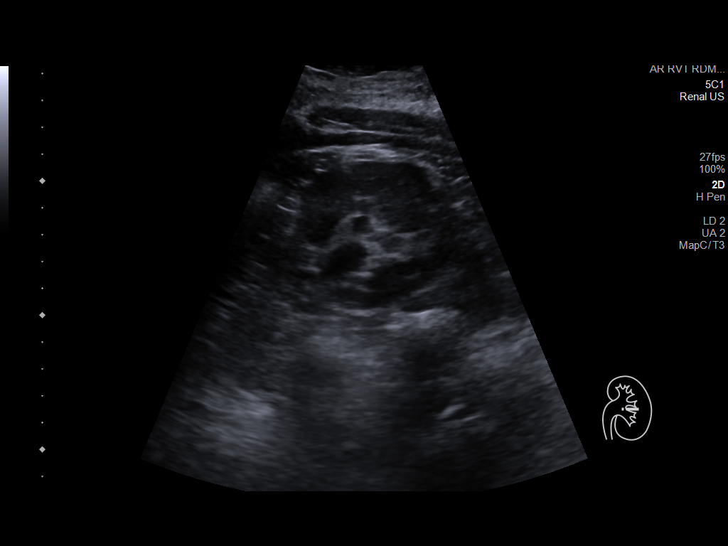
[im 48/53]
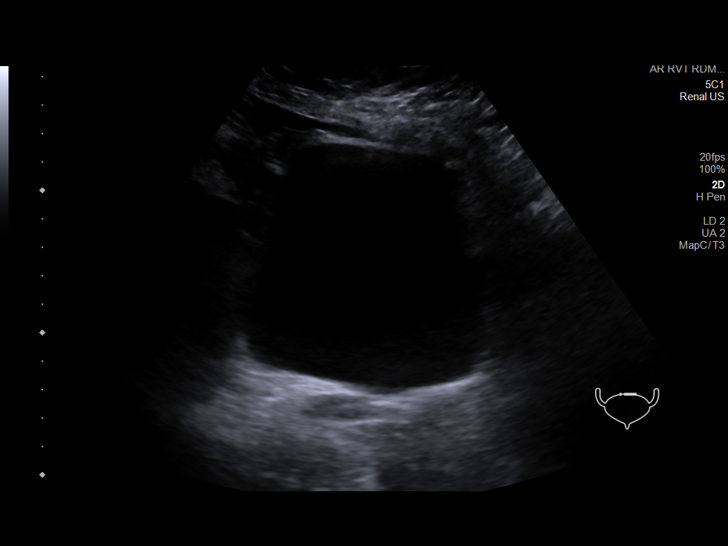
[im 53/53]
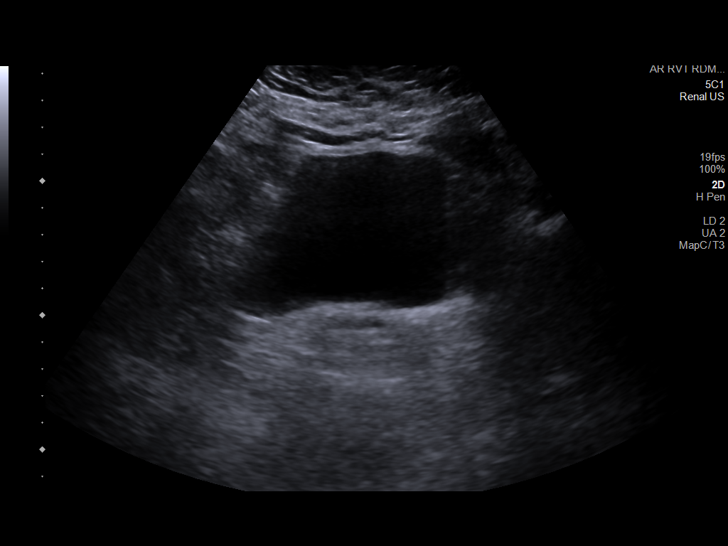

[14 of 25 positions shown; findings below may reference images not displayed]

FINDINGS: Right Kidney:

Renal measurements: 12.6 x 5.5 x 6.2 cm = volume: 2-2.8 mL.
Echogenicity is within normal limits. Several echogenic, shadowing
calculi are present, largest in the lower pole measures up to 8 mm
in diameter. At most mild hydronephrosis with pelvicaliectasis.
Persists postvoiding. No concerning renal mass.

Left Kidney:

Renal measurements: 13.9 x 6.5 x 6 cm = volume: 284.4 mL.
Echogenicity is within normal limits. Several echogenic, shadowing
calculi are present, largest present in the lower pole measures up
to 8 mm in size. At most mild hydronephrosis with pelvicaliectasis
which persists postvoiding. No concerning renal mass.

Bladder:

Bilateral bladder jets are seen.

Other:

Echogenic appearance of the liver with loss of portal [REDACTED]
definition compatible with hepatic steatosis.
IMPRESSION: Bilateral nonobstructing nephroliths are present.

At most mild hydronephrosis/pelvicaliectasis which persists
postvoiding and with visualization of the bilateral bladder jets,
nonspecific.

Hepatic steatosis.

## 2021-02-24 NOTE — Telephone Encounter (Signed)
See other note

## 2021-02-25 ENCOUNTER — Other Ambulatory Visit: Payer: Self-pay | Admitting: Family Medicine

## 2021-02-25 DIAGNOSIS — I10 Essential (primary) hypertension: Secondary | ICD-10-CM

## 2021-03-07 ENCOUNTER — Ambulatory Visit: Payer: BC Managed Care – PPO | Admitting: Family Medicine

## 2021-03-07 ENCOUNTER — Encounter: Payer: Self-pay | Admitting: Family Medicine

## 2021-03-07 ENCOUNTER — Other Ambulatory Visit: Payer: Self-pay

## 2021-03-07 VITALS — BP 116/69 | HR 58 | Temp 97.6°F | Ht 63.0 in | Wt 196.8 lb

## 2021-03-07 DIAGNOSIS — E890 Postprocedural hypothyroidism: Secondary | ICD-10-CM

## 2021-03-07 DIAGNOSIS — M19042 Primary osteoarthritis, left hand: Secondary | ICD-10-CM

## 2021-03-07 DIAGNOSIS — L659 Nonscarring hair loss, unspecified: Secondary | ICD-10-CM

## 2021-03-07 DIAGNOSIS — I10 Essential (primary) hypertension: Secondary | ICD-10-CM | POA: Diagnosis not present

## 2021-03-07 DIAGNOSIS — Z85850 Personal history of malignant neoplasm of thyroid: Secondary | ICD-10-CM | POA: Diagnosis not present

## 2021-03-07 DIAGNOSIS — M19041 Primary osteoarthritis, right hand: Secondary | ICD-10-CM

## 2021-03-07 DIAGNOSIS — L680 Hirsutism: Secondary | ICD-10-CM

## 2021-03-07 LAB — BAYER DCA HB A1C WAIVED: HB A1C (BAYER DCA - WAIVED): 5.7 % (ref <7.0)

## 2021-03-07 MED ORDER — AMLODIPINE BESYLATE 10 MG PO TABS: 10.0000 mg | ORAL_TABLET | Freq: Every day | ORAL | 3 refills | Status: DC

## 2021-03-07 MED ORDER — METOPROLOL SUCCINATE ER 100 MG PO TB24: 100.0000 mg | ORAL_TABLET | Freq: Every day | ORAL | 3 refills | Status: DC

## 2021-03-07 NOTE — Progress Notes (Signed)
Subjective: CC: HTN, hypothyroidism PCP: Janora Norlander, DO [facility]:ENIDP Ann Peterson is a 55 y.o. female presenting to clinic today for:  1. HTN associated with morbid obesity Patient reports compliance with lisinopril 20 mg daily, metoprolol extended release 100 mg daily.  No reports of chest pain, shortness of breath.  She has been actively trying to lose weight with increased physical exercise and change in diet but she is only lost about 5 pounds over the last several months.  She is felt quite deflated with this.  She has noticed some increased hair growth along her face and in fact hair loss along the scalp.  She is also noticing increase in acne.  She has been postmenopausal since her hysterectomy.  She has ovaries remaining.  She wonders if perhaps she is going through menopause  2. 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  Otherwise asymptomatic.  No reports of GI change, tremors, heart palpitations.   ROS: Per HPI  Allergies  Allergen Reactions   Pollen Extract    Past Medical History:  Diagnosis Date   Cancer ([place]) 2000   thryoid papillary carcinoma   COVID-19 12/03/2019   body aches, joint pain, fever 104 x 4 days, then symptoms resolved   History of kidney stones    Hyperlipidemia    Hypertension    PONV (postoperative nausea and vomiting)    n/v after appendectomy 5-6 yrs ago   Sleep apnea    uses cpap   Thyroid disease    thyroidectomy due to cancer    Current Outpatient Medications:    amLODipine (NORVASC) 10 MG tablet, Take 1 tablet (10 mg total) by mouth daily., Disp: 30 tablet, Rfl: 0   BIOTIN PO, Take 1 capsule by mouth every evening., Disp: , Rfl:    Cholecalciferol (VITAMIN D) 50 MCG (2000 UT) CAPS, Take 1 capsule by mouth every evening., Disp: , Rfl:    diclofenac (VOLTAREN) 75 MG EC tablet, Take 1 tablet (75 mg total) by mouth 2 (two) times daily as needed for moderate pain., Disp: 30 tablet, Rfl: 0    fluticasone (FLONASE) 50 MCG/ACT nasal spray, SPRAY 2 SPRAYS INTO EACH NOSTRIL EVERY DAY, Disp: 48 mL, Rfl: 1   levothyroxine (SYNTHROID) 175 MCG tablet, TAKE 1 TABLET BY MOUTH EVERY DAY, Disp: 30 tablet, Rfl: 11   lisinopril (ZESTRIL) 20 MG tablet, TAKE 1/2 TABLET(10 MG TOTAL) BY MOUTH DAILY. (NEED TO BE SEEN), Disp: 45 tablet, Rfl: 1   magnesium oxide (MAG-OX) 400 MG tablet, Take 400 mg by mouth daily., Disp: , Rfl:    metoprolol succinate (TOPROL-XL) 100 MG 24 hr tablet, Take 1 tablet (100 mg total) by mouth daily. With or immediately following a meal, Disp: 30 tablet, Rfl: 0   Multiple Vitamins-Minerals (MULTIVITAMIN ADULTS) TABS, Take 1 tablet by mouth at bedtime. , Disp: , Rfl:    traZODone (DESYREL) 50 MG tablet, TAKE 0.5-2 TABLETS (25-100 MG TOTAL) BY MOUTH AT BEDTIME AS NEEDED FOR SLEEP., Disp: 180 tablet, Rfl: 0   TURMERIC PO, Take 1 capsule by mouth at bedtime. , Disp: , Rfl:  Social History   Socioeconomic History   Marital status: Widowed    Spouse name: Not on file   Number of children: Not on file   Years of education: Not on file   Highest education level: Not on file  Occupational History   Not on file  Tobacco Use   Smoking status: Never   Smokeless tobacco: Never  Vaping Use   Vaping Use: Never used  Substance and Sexual Activity   Alcohol use: No   Drug use: No   Sexual activity: Not on file  Other Topics Concern   Not on file  Social History Narrative   Not on file   Social Determinants of Health   Financial Resource Strain: Not on file  Food Insecurity: Not on file  Transportation Needs: Not on file  Physical Activity: Not on file  Stress: Not on file  Social Connections: Not on file  Intimate Partner Violence: Not on file   Family History  Problem Relation Age of Onset   Hypertension Mother    Dementia Mother    Macular degeneration Mother    Stroke Mother    COPD Father        lung cancer   Cancer Brother        colon cancer   Cancer Brother         brain tumor    Objective: Office vital signs reviewed. BP 116/69   Pulse (!) 58   Temp 97.6 F (36.4 C)   Ht 5' 3"  (1.6 m)   Wt 196 lb 12.8 oz (89.3 kg)   LMP 09/07/2016 (Exact Date)   SpO2 95%   BMI 34.86 kg/m   Physical Examination:  General: Awake, alert, obese, No acute distress; cushingoid body habitus HEENT: Normal; clear white.  No exophthalmos  Cardio: regular rate and rhythm, S1S2 heard, no murmurs appreciated Pulm: clear to auscultation bilaterally, no wheezes, rhonchi or rales; normal work of breathing on room air Extremities: warm, well perfused, No edema, cyanosis or clubbing; +2 pulses bilaterally Skin: dry; intact; no rashes or lesions; normal temperature; villous hair growth noted along her face bilaterally Neuro: No tremor   Assessment/ Plan: 55 y.o. female   Postoperative hypothyroidism - Plan: TSH, T4, Free  History of thyroid cancer - Plan: CANCELED: CBC  Essential hypertension - Plan: CMP14+EGFR, amLODipine (NORVASC) 10 MG tablet  Morbid obesity (HCC) - Plan: CMP14+EGFR, LDL Cholesterol, Direct, Bayer DCA Hb A1c Waived, Insulin, random  Hair thinning - Plan: FSH/LH  Hirsutism - Plan: FSH/LH, Insulin, random  Primary osteoarthritis of both hands - Plan: ANA w/Reflex if Positive, Arthritis Panel  Check TSH, free T4.  Blood pressure under good control.  Continue Norvasc.  Check CMP.  I anticipate that we will put her on spironolactone so I would favor discontinuing the ACE inhibitor so as to avoid hyperkalemia.  Awaiting her laboratory results before proceeding but she is aware that this will be the plan.  Would like a blood pressure check roughly 2 weeks of following discontinuation of lisinopril and initiation of spironolactone along with repeat BMP.  I have ordered arthritis labs given reports of pinky pain and joint inflammation.  No known autoimmune disease  With regards to her obesity, we did discuss consideration for Ozempic versus  Wegovy versus Mounjaro.  Would like to see what her metabolic profile looks like before proceeding.  She does have a history of papillary thyroid cancer we discussed that there was evidence with medullary thyroid cancers that showed increased risk.  No orders of the defined types were placed in this encounter.  No orders of the defined types were placed in this encounter.    Janora Norlander, DO [place] [phone]

## 2021-03-07 NOTE — Patient Instructions (Signed)
You had labs performed today.  You will be contacted with the results of the labs once they are available, usually in the next 3 business days for routine lab work.  If you have an active my chart account, they will be released to your MyChart.  If you prefer to have these labs released to you via telephone, please let us know.  So Spironolactone can actually HELP scalp hair growth while REDUCING facial hair growth and acne.  We'd need to discontinue the Lisinopril if we proceed with that medication.  Together they have the potential to make potassium too high.  We will see what labs say but may plan for a trial of spironolactone for your symptoms.  Androgenetic alopecia  Androgenetic alopecia the most common cause of hair loss. This hair loss is genetically programmed. Men may see a receding hair line or thinning or baldness on the top of their scalp. Women tend to have a widening part and loss of hair at the top of their scalp. Men may take oral medication such as Propecia or may use a topical medication like minoxidil (Rogaine) to treat the condition. Women may also use the topical medication minoxidil. Some physicians may treat women with spironolactone, a fluid pill, which can lower female hormone levels subsequently causing regrowth of hair or slowing of the hair loss.

## 2021-03-08 ENCOUNTER — Other Ambulatory Visit: Payer: Self-pay | Admitting: Family Medicine

## 2021-03-08 ENCOUNTER — Encounter: Payer: Self-pay | Admitting: Family Medicine

## 2021-03-08 DIAGNOSIS — L680 Hirsutism: Secondary | ICD-10-CM

## 2021-03-08 DIAGNOSIS — E8881 Metabolic syndrome: Secondary | ICD-10-CM

## 2021-03-08 LAB — CMP14+EGFR
ALT: 35 IU/L — ABNORMAL HIGH (ref 0–32)
AST: 18 IU/L (ref 0–40)
Albumin/Globulin Ratio: 2.2 (ref 1.2–2.2)
Albumin: 4.7 g/dL (ref 3.8–4.9)
Alkaline Phosphatase: 81 IU/L (ref 44–121)
BUN/Creatinine Ratio: 17 (ref 9–23)
BUN: 18 mg/dL (ref 6–24)
Bilirubin Total: 0.3 mg/dL (ref 0.0–1.2)
CO2: 25 mmol/L (ref 20–29)
Calcium: 9.5 mg/dL (ref 8.7–10.2)
Chloride: 100 mmol/L (ref 96–106)
Creatinine, Ser: 1.03 mg/dL — ABNORMAL HIGH (ref 0.57–1.00)
Globulin, Total: 2.1 g/dL (ref 1.5–4.5)
Glucose: 91 mg/dL (ref 65–99)
Potassium: 4.3 mmol/L (ref 3.5–5.2)
Sodium: 140 mmol/L (ref 134–144)
Total Protein: 6.8 g/dL (ref 6.0–8.5)
eGFR: 65 mL/min/{1.73_m2} (ref >59)

## 2021-03-08 LAB — ARTHRITIS PANEL
Basophils Absolute: 0 10*3/uL (ref 0.0–0.2)
Basos: 0 %
EOS (ABSOLUTE): 0.1 10*3/uL (ref 0.0–0.4)
Eos: 1 %
Hematocrit: 39 % (ref 34.0–46.6)
Hemoglobin: 13.4 g/dL (ref 11.1–15.9)
Immature Grans (Abs): 0 10*3/uL (ref 0.0–0.1)
Immature Granulocytes: 0 %
Lymphocytes Absolute: 1.6 10*3/uL (ref 0.7–3.1)
Lymphs: 24 %
MCH: 31.2 pg (ref 26.6–33.0)
MCHC: 34.4 g/dL (ref 31.5–35.7)
MCV: 91 fL (ref 79–97)
Monocytes Absolute: 0.5 10*3/uL (ref 0.1–0.9)
Monocytes: 7 %
Neutrophils Absolute: 4.5 10*3/uL (ref 1.4–7.0)
Neutrophils: 68 %
Platelets: 183 10*3/uL (ref 150–450)
RBC: 4.3 x10E6/uL (ref 3.77–5.28)
RDW: 12.6 % (ref 11.7–15.4)
Rheumatoid fact SerPl-aCnc: <10 IU/mL (ref <14.0)
Sed Rate: 9 mm/hr (ref 0–40)
Uric Acid: 6.5 mg/dL (ref 3.0–7.2)
WBC: 6.7 10*3/uL (ref 3.4–10.8)

## 2021-03-08 LAB — INSULIN, RANDOM: INSULIN: 41 u[IU]/mL — ABNORMAL HIGH (ref 2.6–24.9)

## 2021-03-08 LAB — LDL CHOLESTEROL, DIRECT: LDL Direct: 153 mg/dL — ABNORMAL HIGH (ref 0–99)

## 2021-03-08 LAB — FSH/LH
FSH: 43.5 m[IU]/mL
LH: 21.8 m[IU]/mL

## 2021-03-08 LAB — ANA W/REFLEX IF POSITIVE: Anti Nuclear Antibody (ANA): NEGATIVE

## 2021-03-08 LAB — T4, FREE: Free T4: 1.73 ng/dL (ref 0.82–1.77)

## 2021-03-08 LAB — TSH: TSH: 2.12 u[IU]/mL (ref 0.450–4.500)

## 2021-03-08 MED ORDER — METFORMIN HCL 500 MG PO TABS: 500.0000 mg | ORAL_TABLET | Freq: Every day | ORAL | 0 refills | Status: DC

## 2021-03-08 MED ORDER — SPIRONOLACTONE 50 MG PO TABS: 50.0000 mg | ORAL_TABLET | Freq: Two times a day (BID) | ORAL | 0 refills | Status: DC

## 2021-03-20 ENCOUNTER — Telehealth: Payer: BC Managed Care – PPO

## 2021-03-20 ENCOUNTER — Ambulatory Visit (INDEPENDENT_AMBULATORY_CARE_PROVIDER_SITE_OTHER): Payer: BC Managed Care – PPO | Admitting: Family Medicine

## 2021-03-20 ENCOUNTER — Encounter: Payer: Self-pay | Admitting: Family Medicine

## 2021-03-20 DIAGNOSIS — U071 COVID-19: Secondary | ICD-10-CM | POA: Diagnosis not present

## 2021-03-20 MED ORDER — MOLNUPIRAVIR EUA 200MG CAPSULE: 4.0000 | ORAL_CAPSULE | Freq: Two times a day (BID) | ORAL | 0 refills | Status: AC

## 2021-03-20 MED ORDER — PREDNISONE 10 MG (21) PO TBPK: ORAL_TABLET | ORAL | 0 refills | Status: DC

## 2021-03-20 MED ORDER — AZITHROMYCIN 250 MG PO TABS: ORAL_TABLET | ORAL | 0 refills | Status: AC

## 2021-03-20 MED ORDER — ALBUTEROL SULFATE HFA 108 (90 BASE) MCG/ACT IN AERS: 2.0000 | INHALATION_SPRAY | Freq: Four times a day (QID) | RESPIRATORY_TRACT | 2 refills | Status: AC | PRN

## 2021-03-20 NOTE — Progress Notes (Signed)
Virtual Visit via video Note   Due to COVID-19 pandemic this visit was conducted virtually. This visit type was conducted due to national recommendations for restrictions regarding the COVID-19 Pandemic (e.g. social distancing, sheltering in place) in an effort to limit this patient's exposure and mitigate transmission in our community. All issues noted in this document were discussed and addressed.  A physical exam was not performed with this format.  I connected with  Ann Peterson  on 03/20/21 at 1121 by video and verified that I am speaking with the correct person using two identifiers. Ann Peterson is currently located at home and no one is currently with her during the visit. The provider, Gwenlyn Perking, FNP is located in their office at time of visit.  I discussed the limitations, risks, security and privacy concerns of performing an evaluation and management service by video  and the availability of in person appointments. I also discussed with the patient that there may be a patient responsible charge related to this service. The patient expressed understanding and agreed to proceed.  CC: Covid  History and Present Illness:  Ann Peterson reports a positive home Covid test. She reports a scratchy throat, coughing, sneezing, fever, congestion, and runny nose x 2 days. Her fever has gotten as high as 104. It has come down to 101.2 with tylenol. She has some mild shortness of breath with activity. She reports that her chest burns when she coughs. She has been vaccinated and boosted. She has a history of pneumonia.    ROS  AS per HPI.    Observations/Objective: Alert and oriented. Respirations unlabored. Skin appears well perfused. Hoarseness noted in voice.   Assessment and Plan: Ann Peterson was seen today for covid positive.  Diagnoses and all orders for this visit:  COVID-19 Molnupiravir ordered. Patient has hx of HTN and obesity. Has had hysterectomy. Prednisone pack and zpak ordered.  Albuterol prn. Mucinex for cough, congestion.Tylenol for fever, pain. Rest, stay well hydrate. Quarantine discussed. Discussed return precautions and when to seek emergency care.  -     molnupiravir EUA 200 mg CAPS; Take 4 capsules (800 mg total) by mouth 2 (two) times daily for 5 days. -     azithromycin (ZITHROMAX) 250 MG tablet; Take 2 tablets on day 1, then 1 tablet daily on days 2 through 5 -     predniSONE (STERAPRED UNI-PAK 21 TAB) 10 MG (21) TBPK tablet; Use as directed on back of pill pack -     albuterol (VENTOLIN HFA) 108 (90 Base) MCG/ACT inhaler; Inhale 2 puffs into the lungs every 6 (six) hours as needed for wheezing or shortness of breath.    Follow Up Instructions: Return to office for new or worsening symptoms, or if symptoms persist.     I discussed the assessment and treatment plan with the patient. The patient was provided an opportunity to ask questions and all were answered. The patient agreed with the plan and demonstrated an understanding of the instructions.   The patient was advised to call back or seek an in-person evaluation if the symptoms worsen or if the condition fails to improve as anticipated.  The above assessment and management plan was discussed with the patient. The patient verbalized understanding of and has agreed to the management plan. Patient is aware to call the clinic if symptoms persist or worsen. Patient is aware when to return to the clinic for a follow-up visit. Patient educated on when it is appropriate to go to the  emergency department.   Time call ended: 1128  I provided 8 minutes of face-to-face time during this encounter.    Gwenlyn Perking, FNP

## 2021-03-27 ENCOUNTER — Ambulatory Visit: Payer: BC Managed Care – PPO | Admitting: *Deleted

## 2021-03-27 ENCOUNTER — Other Ambulatory Visit: Payer: Self-pay

## 2021-03-27 VITALS — BP 135/80

## 2021-03-27 DIAGNOSIS — I10 Essential (primary) hypertension: Secondary | ICD-10-CM

## 2021-03-27 NOTE — Progress Notes (Signed)
Patient came in for a blood pressure check.  Patient was under the impression that she was also  going to receive an injection for weight loss. Advised patient to contact PCP via MyChart message.

## 2021-03-28 ENCOUNTER — Other Ambulatory Visit: Payer: Self-pay | Admitting: *Deleted

## 2021-03-28 DIAGNOSIS — I10 Essential (primary) hypertension: Secondary | ICD-10-CM

## 2021-03-28 MED ORDER — AMLODIPINE BESYLATE 10 MG PO TABS: 10.0000 mg | ORAL_TABLET | Freq: Every day | ORAL | 2 refills | Status: DC

## 2021-04-04 ENCOUNTER — Other Ambulatory Visit: Payer: Self-pay

## 2021-04-04 ENCOUNTER — Ambulatory Visit: Payer: BC Managed Care – PPO | Admitting: Urology

## 2021-04-04 VITALS — BP 131/80 | HR 80

## 2021-04-04 DIAGNOSIS — N200 Calculus of kidney: Secondary | ICD-10-CM

## 2021-04-04 LAB — URINALYSIS, ROUTINE W REFLEX MICROSCOPIC
Bilirubin, UA: NEGATIVE
Glucose, UA: NEGATIVE
Ketones, UA: NEGATIVE
Leukocytes,UA: NEGATIVE
Nitrite, UA: NEGATIVE
Protein,UA: NEGATIVE
Specific Gravity, UA: 1.015 (ref 1.005–1.030)
Urobilinogen, Ur: 0.2 mg/dL (ref 0.2–1.0)
pH, UA: 6 (ref 5.0–7.5)

## 2021-04-04 LAB — MICROSCOPIC EXAMINATION
Bacteria, UA: NONE SEEN
Epithelial Cells (non renal): NONE SEEN /hpf (ref 0–10)
Renal Epithel, UA: NONE SEEN /hpf
WBC, UA: NONE SEEN /hpf (ref 0–5)

## 2021-04-04 NOTE — Progress Notes (Signed)

## 2021-04-04 NOTE — Progress Notes (Signed)
04/04/2021 4:24 PM   Ann Peterson Seen May 17, 1966 [id number]  Referring provider: Janora Norlander, DO [place],  [place] [address]  Nephrolithiasis  HPI: Ann Peterson is a 55yo here for followup for nephrolithiasis. She has passed 2 stones in the past month. She had a renal US 02/17/2021 which showed bilateral 75m renal calculi. She denies any flank pain currently.    PMH: Past Medical History:  Diagnosis Date   [place] 2000   thryoid papillary carcinoma   COVID-19 12/03/2019   body aches, joint pain, fever 104 x 4 days, then symptoms resolved   History of kidney stones    Hyperlipidemia    Hypertension    PONV (postoperative nausea and vomiting)    n/v after appendectomy 5-6 yrs ago   Sleep apnea    uses cpap   Thyroid disease    thyroidectomy due to cancer    Surgical History: Past Surgical History:  Procedure Laterality Date   ABDOMINAL HYSTERECTOMY  09/12/2016   partial   APPENDECTOMY  2011   CESAREAN SECTION     x 3   CHOLECYSTECTOMY  2000   laproscopic   CYSTOSCOPY W/ URETERAL STENT PLACEMENT N/A 12/03/2019   Procedure: CYSTOSCOPY WITH STENT PLACEMENT, RETROGRADE;  Surgeon: MCleon Gustin MD;  Location: [facility];  Service: Urology;  Laterality: N/A;   CYSTOSCOPY WITH RETROGRADE PYELOGRAM, URETEROSCOPY AND STENT PLACEMENT Left 12/24/2019   Procedure: CYSTOSCOPY WITH RETROGRADE PYELOGRAM, URETEROSCOPY, STENT REMOVAL AND STONE EXTRACTION;  Surgeon: MCleon Gustin MD;  [facility]  Service: Urology;  Laterality: Left;   TOTAL THYROIDECTOMY  2000   sx and radiation done    Home Medications:  Allergies as of 04/04/2021       Reactions   Pollen Extract         Medication List        Accurate as of April 04, 2021  4:24 PM. If you have any questions, ask your nurse or doctor.          albuterol 108 (90 Base) MCG/ACT inhaler Commonly known as: VENTOLIN HFA Inhale 2 puffs into the lungs every 6 (six) hours as  needed for wheezing or shortness of breath.   amLODipine 10 MG tablet Commonly known as: NORVASC Take 1 tablet (10 mg total) by mouth daily.   BIOTIN PO Take 1 capsule by mouth every evening.   diclofenac 75 MG EC tablet Commonly known as: VOLTAREN Take 1 tablet (75 mg total) by mouth 2 (two) times daily as needed for moderate pain.   fluticasone 50 MCG/ACT nasal spray Commonly known as: FLONASE SPRAY 2 SPRAYS INTO EACH NOSTRIL EVERY DAY   levothyroxine 175 MCG tablet Commonly known as: SYNTHROID TAKE 1 TABLET BY MOUTH EVERY DAY   magnesium oxide 400 MG tablet Commonly known as: MAG-OX Take 400 mg by mouth daily.   meloxicam 15 MG tablet Commonly known as: MOBIC Take 15 mg by mouth daily.   metFORMIN 500 MG tablet Commonly known as: GLUCOPHAGE Take 1 tablet (500 mg total) by mouth daily with breakfast.   metoprolol succinate 100 MG 24 hr tablet Commonly known as: TOPROL-XL Take 1 tablet (100 mg total) by mouth daily. With or immediately following a meal   predniSONE 10 MG (21) Tbpk tablet Commonly known as: STERAPRED UNI-PAK 21 TAB Use as directed on back of pill pack   spironolactone 50 MG tablet Commonly known as: Aldactone Take 1 tablet (50 mg total) by mouth  2 (two) times daily.   traZODone 50 MG tablet Commonly known as: DESYREL TAKE 0.5-2 TABLETS (25-100 MG TOTAL) BY MOUTH AT BEDTIME AS NEEDED FOR SLEEP.   TURMERIC PO Take 1 capsule by mouth at bedtime.   Vitamin D 50 MCG (2000 UT) Caps Take 1 capsule by mouth every evening.        Allergies:  Allergies  Allergen Reactions   Pollen Extract     Family History: Family History  Problem Relation Age of Onset   Hypertension Mother    Dementia Mother    Macular degeneration Mother    Stroke Mother    COPD Father        lung cancer   Cancer Brother        colon cancer   Cancer Brother        brain tumor    Social History:  reports that she has never smoked. She has never used smokeless  tobacco. She reports that she does not drink alcohol and does not use drugs.  ROS: All other review of systems were reviewed and are negative except what is noted above in HPI  Physical Exam: BP 131/80   Pulse 80   LMP 09/07/2016 (Exact Date)   Constitutional:  Alert and oriented, No acute distress. HEENT: [place] AT, moist mucus membranes.  Trachea midline, no masses. Cardiovascular: No clubbing, cyanosis, or edema. Respiratory: Normal respiratory effort, no increased work of breathing. GI: Abdomen is soft, nontender, nondistended, no abdominal masses GU: No CVA tenderness.  Lymph: No cervical or inguinal lymphadenopathy. Skin: No rashes, bruises or suspicious lesions. Neurologic: Grossly intact, no focal deficits, moving all 4 extremities. Psychiatric: Normal mood and affect.  Laboratory Data: Lab Results  Component Value Date   WBC 6.7 03/07/2021   HGB 13.4 03/07/2021   HCT 39.0 03/07/2021   MCV 91 03/07/2021   PLT 183 03/07/2021    Lab Results  Component Value Date   CREATININE 1.03 (H) 03/07/2021    No results found for: PSA  No results found for: TESTOSTERONE  Lab Results  Component Value Date   HGBA1C 5.7 03/07/2021    Urinalysis    Component Value Date/Time   COLORURINE YELLOW 07/03/2018 1732   APPEARANCEUR Clear 02/03/2021 1532   LABSPEC 1.015 07/03/2018 1732   PHURINE 7.5 07/03/2018 1732   GLUCOSEU Negative 02/03/2021 1532   HGBUR SMALL (A) 07/03/2018 1732   BILIRUBINUR Negative 02/03/2021 1532   KETONESUR NEGATIVE 07/03/2018 1732   PROTEINUR Negative 02/03/2021 1532   PROTEINUR NEGATIVE 07/03/2018 1732   UROBILINOGEN 0.2 02/20/2014 0845   NITRITE Negative 02/03/2021 1532   NITRITE NEGATIVE 07/03/2018 1732   LEUKOCYTESUR 1+ (A) 02/03/2021 1532    Lab Results  Component Value Date   LABMICR See below: 02/03/2021   WBCUA 11-30 (A) 02/03/2021   LABEPIT 0-10 02/03/2021   MUCUS neg 10/05/2013   BACTERIA Few 02/03/2021    Pertinent  Imaging:  Results for orders placed in visit on 11/30/19  DG Abd 1 View  Narrative CLINICAL DATA:  LEFT flank pain radiating to groin, history of kidney stones  EXAM: ABDOMEN - 1 VIEW  COMPARISON:  10/05/2013  FINDINGS: Multiple tiny BILATERAL renal calculi up to 4 mm RIGHT and 3 mm LEFT.  In addition, 5 x 3 mm calculus at approximately the LEFT ureteropelvic junction, new.  No additional potential urinary tract calcifications.  Small LEFT pelvic phleboliths stable.  Levoconvex thoracolumbar scoliosis.  Bowel gas pattern normal.  IMPRESSION: Multiple BILATERAL renal  calculi within additional 5 x 3 mm calculus projecting over the LEFT ureteropelvic junction region.   Electronically Signed By: Lavonia Dana M.D. On: 11/30/2019 16:57  No results found for this or any previous visit.  No results found for this or any previous visit.  No results found for this or any previous visit.  Results for orders placed during the hospital encounter of 02/17/21  US RENAL  Narrative CLINICAL DATA:  Known nephrolithiasis  EXAM: RENAL / URINARY TRACT ULTRASOUND COMPLETE  COMPARISON:  09/05/2020  FINDINGS: Right Kidney:  Renal measurements: 12.6 x 5.5 x 6.2 cm = volume: 2-2.8 mL. Echogenicity is within normal limits. Several echogenic, shadowing calculi are present, largest in the lower pole measures up to 8 mm in diameter. At most mild hydronephrosis with pelvicaliectasis. Persists postvoiding. No concerning renal mass.  Left Kidney:  Renal measurements: 13.9 x 6.5 x 6 cm = volume: 284.4 mL. Echogenicity is within normal limits. Several echogenic, shadowing calculi are present, largest present in the lower pole measures up to 8 mm in size. At most mild hydronephrosis with pelvicaliectasis which persists postvoiding. No concerning renal mass.  Bladder:  Bilateral bladder jets are seen.  Other:  Echogenic appearance of the liver with loss of portal  triad definition compatible with hepatic steatosis.  IMPRESSION: Bilateral nonobstructing nephroliths are present.  At most mild hydronephrosis/pelvicaliectasis which persists postvoiding and with visualization of the bilateral bladder jets, nonspecific.  Hepatic steatosis.   Electronically Signed By: Lovena Le M.D. On: 02/20/2021 00:21  No results found for this or any previous visit.  No results found for this or any previous visit.  Results for orders placed during the hospital encounter of 09/05/20  CT RENAL STONE STUDY  Narrative CLINICAL DATA:  Left-sided abdominal pain  EXAM: CT ABDOMEN AND PELVIS WITHOUT CONTRAST  TECHNIQUE: Multidetector CT imaging of the abdomen and pelvis was performed following the standard protocol without IV contrast.  COMPARISON:  12/03/2019  FINDINGS: Lower chest: No acute abnormality.  Hepatobiliary: Hepatic steatosis. Cholecystectomy. No biliary dilatation.  Pancreas: Unremarkable  Spleen: Unremarkable.  Adrenals/Urinary Tract: Adrenals are unremarkable. No hydronephrosis. Bilateral nonobstructing renal calculi measuring up to 4 mm. No ureteral calculi. Bladder is not well distended but otherwise unremarkable.  Stomach/Bowel: Stomach is within normal limits. 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 Bowel is normal in caliber.  Vascular/Lymphatic: Aortic atherosclerosis. No enlarged lymph nodes.  Reproductive: Status post hysterectomy. No adnexal masses.  Other: No ascites.  Abdominal wall is unremarkable.  Musculoskeletal: Lumbar spine degenerative changes. No acute osseous abnormality.  IMPRESSION: No obstructing calculus or hydronephrosis. Bilateral nonobstructing renal calculi.  Hepatic steatosis.  Aortic Atherosclerosis (ICD10-I70.0).   Electronically Signed By: Macy Mis M.D. On: 09/05/2020 15:20   Assessment & Plan:    1. Nephrolithiasis -We discussed the management of kidney  stones. These options include observation, ureteroscopy, shockwave lithotripsy (ESWL) and percutaneous nephrolithotomy (PCNL). We discussed which options are relevant to the patient's stone(s). We discussed the natural history of kidney stones as well as the complications of untreated stones and the impact on quality of life without treatment as well as with each of the above listed treatments. We also discussed the efficacy of each treatment in its ability to clear the stone burden. With any of these management options I discussed the signs and symptoms of infection and the need for emergent treatment should these be experienced. For each option we discussed the ability of each procedure to clear the patient of  their stone burden.   For observation I described the risks which include but are not limited to silent renal damage, life-threatening infection, need for emergent surgery, failure to pass stone and pain.   For ureteroscopy I described the risks which include bleeding, infection, damage to contiguous structures, positioning injury, ureteral stricture, ureteral avulsion, ureteral injury, need for prolonged ureteral stent, inability to perform ureteroscopy, need for an interval procedure, inability to clear stone burden, stent discomfort/pain, heart attack, stroke, pulmonary embolus and the inherent risks with general anesthesia.   For shockwave lithotripsy I described the risks which include arrhythmia, kidney contusion, kidney hemorrhage, need for transfusion, pain, inability to adequately break up stone, inability to pass stone fragments, Steinstrasse, infection associated with obstructing stones, need for alternate surgical procedure, need for repeat shockwave lithotripsy, MI, CVA, PE and the inherent risks with anesthesia/conscious sedation.   For PCNL I described the risks including positioning injury, pneumothorax, hydrothorax, need for chest tube, inability to clear stone burden, renal  laceration, arterial venous fistula or malformation, need for embolization of kidney, loss of kidney or renal function, need for repeat procedure, need for prolonged nephrostomy tube, ureteral avulsion, MI, CVA, PE and the inherent risks of general anesthesia.   - The patient would like to proceed with bilateral ureteroscopic stone extraction - Urinalysis, Routine w reflex microscopic   No follow-ups on file.  Nicolette Bang, MD  [facility] [place]

## 2021-04-04 NOTE — H&P (View-Only) (Signed)
04/04/2021 4:24 PM   Ann Peterson Seen 28-Sep-1965 [id number]  Referring provider: Janora Norlander, DO [place],  [place] [address]  Nephrolithiasis  HPI: Ann Peterson is a 55yo here for followup for nephrolithiasis. She has passed 2 stones in the past month. She had a renal US 02/17/2021 which showed bilateral 50m renal calculi. She denies any flank pain currently.    PMH: Past Medical History:  Diagnosis Date   Cancer ([facility] 2000   thryoid papillary carcinoma   COVID-19 12/03/2019   body aches, joint pain, fever 104 x 4 days, then symptoms resolved   History of kidney stones    Hyperlipidemia    Hypertension    PONV (postoperative nausea and vomiting)    n/v after appendectomy 5-6 yrs ago   Sleep apnea    uses cpap   Thyroid disease    thyroidectomy due to cancer    Surgical History: Past Surgical History:  Procedure Laterality Date   ABDOMINAL HYSTERECTOMY  09/12/2016   partial   APPENDECTOMY  2011   CESAREAN SECTION     x 3   CHOLECYSTECTOMY  2000   laproscopic   CYSTOSCOPY W/ URETERAL STENT PLACEMENT N/A 12/03/2019   Procedure: CYSTOSCOPY WITH STENT PLACEMENT, RETROGRADE;  Surgeon: MCleon Gustin MD;  Location: [facility];  Service: Urology;  Laterality: N/A;   CYSTOSCOPY WITH RETROGRADE PYELOGRAM, URETEROSCOPY AND STENT PLACEMENT Left 12/24/2019   Procedure: CYSTOSCOPY WITH RETROGRADE PYELOGRAM, URETEROSCOPY, STENT REMOVAL AND STONE EXTRACTION;  Surgeon: MCleon Gustin MD;  [facility]  Service: Urology;  Laterality: Left;   TOTAL THYROIDECTOMY  2000   sx and radiation done    Home Medications:  Allergies as of 04/04/2021       Reactions   Pollen Extract         Medication List        Accurate as of April 04, 2021  4:24 PM. If you have any questions, ask your nurse or doctor.          albuterol 108 (90 Base) MCG/ACT inhaler Commonly known as: VENTOLIN HFA Inhale 2 puffs into the lungs every 6 (six) hours as  needed for wheezing or shortness of breath.   amLODipine 10 MG tablet Commonly known as: NORVASC Take 1 tablet (10 mg total) by mouth daily.   BIOTIN PO Take 1 capsule by mouth every evening.   diclofenac 75 MG EC tablet Commonly known as: VOLTAREN Take 1 tablet (75 mg total) by mouth 2 (two) times daily as needed for moderate pain.   fluticasone 50 MCG/ACT nasal spray Commonly known as: FLONASE SPRAY 2 SPRAYS INTO EACH NOSTRIL EVERY DAY   levothyroxine 175 MCG tablet Commonly known as: SYNTHROID TAKE 1 TABLET BY MOUTH EVERY DAY   magnesium oxide 400 MG tablet Commonly known as: MAG-OX Take 400 mg by mouth daily.   meloxicam 15 MG tablet Commonly known as: MOBIC Take 15 mg by mouth daily.   metFORMIN 500 MG tablet Commonly known as: GLUCOPHAGE Take 1 tablet (500 mg total) by mouth daily with breakfast.   metoprolol succinate 100 MG 24 hr tablet Commonly known as: TOPROL-XL Take 1 tablet (100 mg total) by mouth daily. With or immediately following a meal   predniSONE 10 MG (21) Tbpk tablet Commonly known as: STERAPRED UNI-PAK 21 TAB Use as directed on back of pill pack   spironolactone 50 MG tablet Commonly known as: Aldactone Take 1 tablet (50 mg total) by mouth  2 (two) times daily.   traZODone 50 MG tablet Commonly known as: DESYREL TAKE 0.5-2 TABLETS (25-100 MG TOTAL) BY MOUTH AT BEDTIME AS NEEDED FOR SLEEP.   TURMERIC PO Take 1 capsule by mouth at bedtime.   Vitamin D 50 MCG (2000 UT) Caps Take 1 capsule by mouth every evening.        Allergies:  Allergies  Allergen Reactions   Pollen Extract     Family History: Family History  Problem Relation Age of Onset   Hypertension Mother    Dementia Mother    Macular degeneration Mother    Stroke Mother    COPD Father        lung cancer   Cancer Brother        colon cancer   Cancer Brother        brain tumor    Social History:  reports that she has never smoked. She has never used smokeless  tobacco. She reports that she does not drink alcohol and does not use drugs.  ROS: All other review of systems were reviewed and are negative except what is noted above in HPI  Physical Exam: BP 131/80   Pulse 80   LMP 09/07/2016 (Exact Date)   Constitutional:  Alert and oriented, No acute distress. HEENT: Ann Peterson AT, moist mucus membranes.  Trachea midline, no masses. Cardiovascular: No clubbing, cyanosis, or edema. Respiratory: Normal respiratory effort, no increased work of breathing. GI: Abdomen is soft, nontender, nondistended, no abdominal masses GU: No CVA tenderness.  Lymph: No cervical or inguinal lymphadenopathy. Skin: No rashes, bruises or suspicious lesions. Neurologic: Grossly intact, no focal deficits, moving all 4 extremities. Psychiatric: Normal mood and affect.  Laboratory Data: Lab Results  Component Value Date   WBC 6.7 03/07/2021   HGB 13.4 03/07/2021   HCT 39.0 03/07/2021   MCV 91 03/07/2021   PLT 183 03/07/2021    Lab Results  Component Value Date   CREATININE 1.03 (H) 03/07/2021    No results found for: PSA  No results found for: TESTOSTERONE  Lab Results  Component Value Date   HGBA1C 5.7 03/07/2021    Urinalysis    Component Value Date/Time   COLORURINE YELLOW 07/03/2018 1732   APPEARANCEUR Clear 02/03/2021 1532   LABSPEC 1.015 07/03/2018 1732   PHURINE 7.5 07/03/2018 1732   GLUCOSEU Negative 02/03/2021 1532   HGBUR SMALL (A) 07/03/2018 1732   BILIRUBINUR Negative 02/03/2021 1532   KETONESUR NEGATIVE 07/03/2018 1732   PROTEINUR Negative 02/03/2021 1532   PROTEINUR NEGATIVE 07/03/2018 1732   UROBILINOGEN 0.2 02/20/2014 0845   NITRITE Negative 02/03/2021 1532   NITRITE NEGATIVE 07/03/2018 1732   LEUKOCYTESUR 1+ (A) 02/03/2021 1532    Lab Results  Component Value Date   LABMICR See below: 02/03/2021   WBCUA 11-30 (A) 02/03/2021   LABEPIT 0-10 02/03/2021   MUCUS neg 10/05/2013   BACTERIA Few 02/03/2021    Pertinent  Imaging:  Results for orders placed in visit on 11/30/19  DG Abd 1 View  Narrative CLINICAL DATA:  LEFT flank pain radiating to groin, history of kidney stones  EXAM: ABDOMEN - 1 VIEW  COMPARISON:  10/05/2013  FINDINGS: Multiple tiny BILATERAL renal calculi up to 4 mm RIGHT and 3 mm LEFT.  In addition, 5 x 3 mm calculus at approximately the LEFT ureteropelvic junction, new.  No additional potential urinary tract calcifications.  Small LEFT pelvic phleboliths stable.  Levoconvex thoracolumbar scoliosis.  Bowel gas pattern normal.  IMPRESSION: Multiple BILATERAL renal  calculi within additional 5 x 3 mm calculus projecting over the LEFT ureteropelvic junction region.   Electronically Signed By: Lavonia Dana M.D. On: 11/30/2019 16:57  No results found for this or any previous visit.  No results found for this or any previous visit.  No results found for this or any previous visit.  Results for orders placed during the hospital encounter of 02/17/21  US RENAL  Narrative CLINICAL DATA:  Known nephrolithiasis  EXAM: RENAL / URINARY TRACT ULTRASOUND COMPLETE  COMPARISON:  09/05/2020  FINDINGS: Right Kidney:  Renal measurements: 12.6 x 5.5 x 6.2 cm = volume: 2-2.8 mL. Echogenicity is within normal limits. Several echogenic, shadowing calculi are present, largest in the lower pole measures up to 8 mm in diameter. At most mild hydronephrosis with pelvicaliectasis. Persists postvoiding. No concerning renal mass.  Left Kidney:  Renal measurements: 13.9 x 6.5 x 6 cm = volume: 284.4 mL. Echogenicity is within normal limits. Several echogenic, shadowing calculi are present, largest present in the lower pole measures up to 8 mm in size. At most mild hydronephrosis with pelvicaliectasis which persists postvoiding. No concerning renal mass.  Bladder:  Bilateral bladder jets are seen.  Other:  Echogenic appearance of the liver with loss of portal  triad definition compatible with hepatic steatosis.  IMPRESSION: Bilateral nonobstructing nephroliths are present.  At most mild hydronephrosis/pelvicaliectasis which persists postvoiding and with visualization of the bilateral bladder jets, nonspecific.  Hepatic steatosis.   Electronically Signed By: Lovena Le M.D. On: 02/20/2021 00:21  No results found for this or any previous visit.  No results found for this or any previous visit.  Results for orders placed during the hospital encounter of 09/05/20  CT RENAL STONE STUDY  Narrative CLINICAL DATA:  Left-sided abdominal pain  EXAM: CT ABDOMEN AND PELVIS WITHOUT CONTRAST  TECHNIQUE: Multidetector CT imaging of the abdomen and pelvis was performed following the standard protocol without IV contrast.  COMPARISON:  12/03/2019  FINDINGS: Lower chest: No acute abnormality.  Hepatobiliary: Hepatic steatosis. Cholecystectomy. No biliary dilatation.  Pancreas: Unremarkable  Spleen: Unremarkable.  Adrenals/Urinary Tract: Adrenals are unremarkable. No hydronephrosis. Bilateral nonobstructing renal calculi measuring up to 4 mm. No ureteral calculi. Bladder is not well distended but otherwise unremarkable.  Stomach/Bowel: Stomach is within normal limits. 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 Bowel is normal in caliber.  Vascular/Lymphatic: Aortic atherosclerosis. No enlarged lymph nodes.  Reproductive: Status post hysterectomy. No adnexal masses.  Other: No ascites.  Abdominal wall is unremarkable.  Musculoskeletal: Lumbar spine degenerative changes. No acute osseous abnormality.  IMPRESSION: No obstructing calculus or hydronephrosis. Bilateral nonobstructing renal calculi.  Hepatic steatosis.  Aortic Atherosclerosis (ICD10-I70.0).   Electronically Signed By: Macy Mis M.D. On: 09/05/2020 15:20   Assessment & Plan:    1. Nephrolithiasis -We discussed the management of kidney  stones. These options include observation, ureteroscopy, shockwave lithotripsy (ESWL) and percutaneous nephrolithotomy (PCNL). We discussed which options are relevant to the patient's stone(s). We discussed the natural history of kidney stones as well as the complications of untreated stones and the impact on quality of life without treatment as well as with each of the above listed treatments. We also discussed the efficacy of each treatment in its ability to clear the stone burden. With any of these management options I discussed the signs and symptoms of infection and the need for emergent treatment should these be experienced. For each option we discussed the ability of each procedure to clear the patient of  their stone burden.   For observation I described the risks which include but are not limited to silent renal damage, life-threatening infection, need for emergent surgery, failure to pass stone and pain.   For ureteroscopy I described the risks which include bleeding, infection, damage to contiguous structures, positioning injury, ureteral stricture, ureteral avulsion, ureteral injury, need for prolonged ureteral stent, inability to perform ureteroscopy, need for an interval procedure, inability to clear stone burden, stent discomfort/pain, heart attack, stroke, pulmonary embolus and the inherent risks with general anesthesia.   For shockwave lithotripsy I described the risks which include arrhythmia, kidney contusion, kidney hemorrhage, need for transfusion, pain, inability to adequately break up stone, inability to pass stone fragments, Steinstrasse, infection associated with obstructing stones, need for alternate surgical procedure, need for repeat shockwave lithotripsy, MI, CVA, PE and the inherent risks with anesthesia/conscious sedation.   For PCNL I described the risks including positioning injury, pneumothorax, hydrothorax, need for chest tube, inability to clear stone burden, renal  laceration, arterial venous fistula or malformation, need for embolization of kidney, loss of kidney or renal function, need for repeat procedure, need for prolonged nephrostomy tube, ureteral avulsion, MI, CVA, PE and the inherent risks of general anesthesia.   - The patient would like to proceed with bilateral ureteroscopic stone extraction - Urinalysis, Routine w reflex microscopic   No follow-ups on file.  Nicolette Bang, MD  [facility] [place]

## 2021-04-05 ENCOUNTER — Encounter: Payer: Self-pay | Admitting: Urology

## 2021-04-05 LAB — BASIC METABOLIC PANEL
BUN/Creatinine Ratio: 28 — ABNORMAL HIGH (ref 9–23)
BUN: 19 mg/dL (ref 6–24)
CO2: 26 mmol/L (ref 20–29)
Calcium: 10.2 mg/dL (ref 8.7–10.2)
Chloride: 98 mmol/L (ref 96–106)
Creatinine, Ser: 0.68 mg/dL (ref 0.57–1.00)
Glucose: 110 mg/dL — ABNORMAL HIGH (ref 65–99)
Potassium: 4.7 mmol/L (ref 3.5–5.2)
Sodium: 142 mmol/L (ref 134–144)
eGFR: 103 mL/min/{1.73_m2} (ref >59)

## 2021-04-05 LAB — PTH, INTACT AND CALCIUM
Calcium: 9.8 mg/dL (ref 8.7–10.2)
PTH: 16 pg/mL (ref 15–65)

## 2021-04-05 LAB — URIC ACID: Uric Acid: 5.5 mg/dL (ref 3.0–7.2)

## 2021-04-05 NOTE — Patient Instructions (Signed)
Textbook of Natural Medicine (5th ed., pp. 1518-1527.e3). [place], [place]: Elsevier.">  Dietary Guidelines to Help Prevent Kidney Stones Kidney stones are deposits of minerals and salts that form inside your kidneys. Your risk of developing kidney stones may be greater depending on your diet, your lifestyle, the medicines you take, and whether you have certain medical conditions. Most people can lower their chances of developing kidney stones by following the instructions below. Your dietitian may give you more specific instructions depending on your overall health and the type of kidney stones youtend to develop. What are tips for following this plan? Reading food labels  Choose foods with "no salt added" or "low-salt" labels. Limit your salt (sodium) intake to less than 1,500 mg a day. Choose foods with calcium for each meal and snack. Try to eat about 300 mg of calcium at each meal. Foods that contain 200-500 mg of calcium a serving include: 8 oz (237 mL) of milk, calcium-fortifiednon-dairy milk, and calcium-fortifiedfruit juice. Calcium-fortified means that calcium has been added to these drinks. 8 oz (237 mL) of kefir, yogurt, and soy yogurt. 4 oz (114 g) of tofu. 1 oz (28 g) of cheese. 1 cup (150 g) of dried figs. 1 cup (91 g) of cooked broccoli. One 3 oz (85 g) can of sardines or mackerel. Most people need 1,000-1,500 mg of calcium a day. Talk to your dietitian abouthow much calcium is recommended for you. Shopping Buy plenty of fresh fruits and vegetables. Most people do not need to avoid fruits and vegetables, even if these foods contain nutrients that may contribute to kidney stones. When shopping for convenience foods, choose: Whole pieces of fruit. Pre-made salads with dressing on the side. Low-fat fruit and yogurt smoothies. Avoid buying frozen meals or prepared deli foods. These can be high in sodium. Look for foods with live cultures, such as yogurt and kefir. Choose high-fiber  grains, such as whole-wheat breads, oat bran, and wheat cereals. Cooking Do not add salt to food when cooking. Place a salt shaker on the table and allow each person to add his or her own salt to taste. Use vegetable protein, such as beans, textured vegetable protein (TVP), or tofu, instead of meat in pasta, casseroles, and soups. Meal planning Eat less salt, if told by your dietitian. To do this: Avoid eating processed or pre-made food. Avoid eating fast food. Eat less animal protein, including cheese, meat, poultry, or fish, if told by your dietitian. To do this: Limit the number of times you have meat, poultry, fish, or cheese each week. Eat a diet free of meat at least 2 days a week. Eat only one serving each day of meat, poultry, fish, or seafood. When you prepare animal protein, cut pieces into small portion sizes. For most meat and fish, one serving is about the size of the palm of your hand. Eat at least five servings of fresh fruits and vegetables each day. To do this: Keep fruits and vegetables on hand for snacks. Eat one piece of fruit or a handful of berries with breakfast. Have a salad and fruit at lunch. Have two kinds of vegetables at dinner. Limit foods that are high in a substance called oxalate. These include: Spinach (cooked), rhubarb, beets, sweet potatoes, and Swiss chard. Peanuts. Potato chips, french fries, and baked potatoes with skin on. Nuts and nut products. Chocolate. If you regularly take a diuretic medicine, make sure to eat at least 1 or 2 servings of fruits or vegetables that are   high in potassium each day. These include: Avocado. Banana. Orange, prune, carrot, or tomato juice. Baked potato. Cabbage. Beans and split peas. Lifestyle  Drink enough fluid to keep your urine pale yellow. This is the most important thing you can do. Spread your fluid intake throughout the day. If you drink alcohol: Limit how much you use to: 0-1 drink a day for women who  are not pregnant. 0-2 drinks a day for men. Be aware of how much alcohol is in your drink. In the [place] one drink equals one 12 oz bottle of beer (355 mL), one 5 oz glass of wine (148 mL), or one 1 oz glass of hard liquor (44 mL). Lose weight if told by your health care provider. Work with your dietitian to find an eating plan and weight loss strategies that work best for you.  General information Talk to your health care provider and dietitian about taking daily supplements. You may be told the following depending on your health and the cause of your kidney stones: Not to take supplements with vitamin C. To take a calcium supplement. To take a daily probiotic supplement. To take other supplements such as magnesium, fish oil, or vitamin B6. Take over-the-counter and prescription medicines only as told by your health care provider. These include supplements. What foods should I limit? Limit your intake of the following foods, or eat them as told by your dietitian. Vegetables Spinach. Rhubarb. Beets. Canned vegetables. Pickles. Olives. Baked potatoeswith skin. Grains Wheat bran. Baked goods. Salted crackers. Cereals high in sugar. Meats and other proteins Nuts. Nut butters. Large portions of meat, poultry, or fish. Salted, precooked,or cured meats, such as sausages, meat loaves, and hot dogs. Dairy Cheese. Beverages Regular soft drinks. Regular vegetable juice. Seasonings and condiments Seasoning blends with salt. Salad dressings. Soy sauce. Ketchup. Barbecue sauce. Other foods Canned soups. Canned pasta sauce. Casseroles. Pizza. Lasagna. Frozen meals.Potato chips. French fries. The items listed above may not be a complete list of foods and beverages you should limit. Contact a dietitian for more information. What foods should I avoid? Talk to your dietitian about specific foods you should avoid based on the typeof kidney stones you have and your overall health. Fruits Grapefruit. The  item listed above may not be a complete list of foods and beverages you should avoid. Contact a dietitian for more information. Summary Kidney stones are deposits of minerals and salts that form inside your kidneys. You can lower your risk of kidney stones by making changes to your diet. The most important thing you can do is drink enough fluid. Drink enough fluid to keep your urine pale yellow. Talk to your dietitian about how much calcium you should have each day, and eat less salt and animal protein as told by your dietitian. This information is not intended to replace advice given to you by your health care provider. Make sure you discuss any questions you have with your healthcare provider. Document Revised: 08/20/2019 Document Reviewed: 08/20/2019 Elsevier Patient Education  2022 Elsevier Inc.  

## 2021-04-11 ENCOUNTER — Ambulatory Visit: Payer: BC Managed Care – PPO | Admitting: Podiatry

## 2021-04-11 ENCOUNTER — Other Ambulatory Visit: Payer: Self-pay

## 2021-04-11 DIAGNOSIS — M722 Plantar fascial fibromatosis: Secondary | ICD-10-CM | POA: Diagnosis not present

## 2021-04-12 NOTE — Progress Notes (Signed)
Results via mychart

## 2021-04-16 NOTE — Progress Notes (Signed)
  Subjective:  Patient ID: Ann Peterson, female    DOB: 09-11-1965,  MRN: [id number]  Chief Complaint  Patient presents with   Foot Pain    left foot-pt is requesting injection    55 y.o. female presents with the above complaint. History confirmed with patient.  She states it feels like Planter fasciitis.  Its been ongoing for years and she had a flareup a few months ago that she has not been able to eliminate herself.  Is quite hard to walk.  Hurts  where arch meets the heel.  She is a Marine scientist at [employer]  Objective:  Physical Exam: warm, good capillary refill, no trophic changes or ulcerative lesions, normal DP and PT pulses, and normal sensory exam. Left Foot: point tenderness over the heel pad and point tenderness of the mid plantar fascia Assessment:   1. Plantar fasciitis of left foot      Plan:  Patient was evaluated and treated and all questions answered.  Discussed the etiology and treatment options for plantar fasciitis including stretching, formal physical therapy, supportive shoegears such as a running shoe or sneaker, pre fabricated orthoses, injection therapy, and oral medications. We also discussed the role of surgical treatment of this for patients who do not improve after exhausting non-surgical treatment options.   -Educated patient on stretching and icing of the affected limb -Injection delivered to the plantar fascia of the left foot.  After sterile prep with povidone-iodine solution and alcohol, the left heel was injected with 0.5cc 2% xylocaine plain, 0.5cc 0.5% marcaine plain, '5mg'$  triamcinolone acetonide, and '2mg'$  dexamethasone was injected along the medial plantar fascia at the insertion on the plantar calcaneus. The patient tolerated the procedure well without complication.  Return if symptoms worsen or fail to improve.

## 2021-04-27 NOTE — Patient Instructions (Signed)
Ann Peterson  04/27/2021     '@PREFPERIOPPHARMACY'$ @   Your procedure is scheduled on  05/04/2021.   Report to [facility] at  [phone]  A.M.   Call this number if you have problems the morning of surgery:  [phone]   Remember:  Do not eat or drink after midnight.      Take these medicines the morning of surgery with A SIP OF WATER            amlodipine, voltaren or mobic (if needed), levothyroxine, metoprolol.     Do not wear jewelry, make-up or nail polish.  Do not wear lotions, powders, or perfumes, or deodorant.  Do not shave 48 hours prior to surgery.  Men may shave face and neck.  Do not bring valuables to the hospital.  [facility] is not responsible for any belongings or valuables.  Contacts, dentures or bridgework may not be worn into surgery.  Leave your suitcase in the car.  After surgery it may be brought to your room.  For patients admitted to the hospital, discharge time will be determined by your treatment team.  Patients discharged the day of surgery will not be allowed to drive home and must  have someone with them for 24 hours.    Special instructions:   DO NOT smoke tobacco or vape fore 24 hours before your procedure.  Please read over the following fact sheets that you were given. Coughing and Deep Breathing, Surgical Site Infection Prevention, Anesthesia Post-op Instructions, and Care and Recovery After Surgery      Ureteral Stent Implantation, Care After This sheet gives you information about how to care for yourself after your procedure. Your health care provider may also give you more specific instructions. If you have problems or questions, contact your health careprovider. What can I expect after the procedure? After the procedure, it is common to have: Nausea. Mild pain when you urinate. You may feel this pain in your lower back or lower abdomen. The pain should stop within a few minutes after you urinate. This may last for up to 1  week. A small amount of blood in your urine for several days. Follow these instructions at home: Medicines Take over-the-counter and prescription medicines only as told by your health care provider. If you were prescribed an antibiotic medicine, take it as told by your health care provider. Do not stop taking the antibiotic even if you start to feel better. Do not drive for 24 hours if you were given a sedative during your procedure. Ask your health care provider if the medicine prescribed to you requires you to avoid driving or using heavy machinery. Activity Rest as told by your health care provider. Avoid sitting for a long time without moving. Get up to take short walks every 1-2 hours. This is important to improve blood flow and breathing. Ask for help if you feel weak or unsteady. Return to your normal activities as told by your health care provider. Ask your health care provider what activities are safe for you. General instructions  Watch for any blood in your urine. Call your health care provider if the amount of blood in your urine increases. If you have a catheter: Follow instructions from your health care provider about taking care of your catheter and collection bag. Do not take baths, swim, or use a hot tub until your health care provider approves. Ask your health care provider if you may  take showers. You may only be allowed to take sponge baths. Drink enough fluid to keep your urine pale yellow. Do not use any products that contain nicotine or tobacco, such as cigarettes, e-cigarettes, and chewing tobacco. These can delay healing after surgery. If you need help quitting, ask your health care provider. Keep all follow-up visits as told by your health care provider. This is important.  Contact a health care provider if: You have pain that gets worse or does not get better with medicine, especially pain when you urinate. You have difficulty urinating. You feel nauseous or you  vomit repeatedly during a period of more than 2 days after the procedure. Get help right away if: Your urine is dark red or has blood clots in it. You are leaking urine (have incontinence). The end of the stent comes out of your urethra. You cannot urinate. You have sudden, sharp, or severe pain in your abdomen or lower back. You have a fever. You have swelling or pain in your legs. You have difficulty breathing. Summary After the procedure, it is common to have mild pain when you urinate that goes away within a few minutes after you urinate. This may last for up to 1 week. Watch for any blood in your urine. Call your health care provider if the amount of blood in your urine increases. Take over-the-counter and prescription medicines only as told by your health care provider. Drink enough fluid to keep your urine pale yellow. This information is not intended to replace advice given to you by your health care provider. Make sure you discuss any questions you have with your healthcare provider. Document Revised: 06/03/2018 Document Reviewed: 06/04/2018 Elsevier Patient Education  2022 [place] Anesthesia, Adult, Care After This sheet gives you information about how to care for yourself after your procedure. Your health care provider may also give you more specific instructions. If you have problems or questions, contact your health careprovider. What can I expect after the procedure? After the procedure, the following side effects are common: Pain or discomfort at the IV site. Nausea. Vomiting. Sore throat. Trouble concentrating. Feeling cold or chills. Feeling weak or tired. Sleepiness and fatigue. Soreness and body aches. These side effects can affect parts of the body that were not involved in surgery. Follow these instructions at home: For the time period you were told by your health care provider:  Rest. Do not participate in activities where you could fall or  become injured. Do not drive or use machinery. Do not drink alcohol. Do not take sleeping pills or medicines that cause drowsiness. Do not make important decisions or sign legal documents. Do not take care of children on your own.  Eating and drinking Follow any instructions from your health care provider about eating or drinking restrictions. When you feel hungry, start by eating small amounts of foods that are soft and easy to digest (bland), such as toast. Gradually return to your regular diet. Drink enough fluid to keep your urine pale yellow. If you vomit, rehydrate by drinking water, juice, or clear broth. General instructions If you have sleep apnea, surgery and certain medicines can increase your risk for breathing problems. Follow instructions from your health care provider about wearing your sleep device: Anytime you are sleeping, including during daytime naps. While taking prescription pain medicines, sleeping medicines, or medicines that make you drowsy. Have a responsible adult stay with you for the time you are told. It is important to have someone help  care for you until you are awake and alert. Return to your normal activities as told by your health care provider. Ask your health care provider what activities are safe for you. Take over-the-counter and prescription medicines only as told by your health care provider. If you smoke, do not smoke without supervision. Keep all follow-up visits as told by your health care provider. This is important. Contact a health care provider if: You have nausea or vomiting that does not get better with medicine. You cannot eat or drink without vomiting. You have pain that does not get better with medicine. You are unable to pass urine. You develop a skin rash. You have a fever. You have redness around your IV site that gets worse. Get help right away if: You have difficulty breathing. You have chest pain. You have blood in your urine  or stool, or you vomit blood. Summary After the procedure, it is common to have a sore throat or nausea. It is also common to feel tired. Have a responsible adult stay with you for the time you are told. It is important to have someone help care for you until you are awake and alert. When you feel hungry, start by eating small amounts of foods that are soft and easy to digest (bland), such as toast. Gradually return to your regular diet. Drink enough fluid to keep your urine pale yellow. Return to your normal activities as told by your health care provider. Ask your health care provider what activities are safe for you. This information is not intended to replace advice given to you by your health care provider. Make sure you discuss any questions you have with your healthcare provider. Document Revised: 05/12/2020 Document Reviewed: 12/10/2019 Elsevier Patient Education  2022 [place]. How to Use Chlorhexidine for Bathing Chlorhexidine gluconate (CHG) is a germ-killing (antiseptic) solution that is used to clean the skin. It can get rid of the bacteria that normally live on the skin and can keep them away for about 24 hours. To clean your skin with CHG, you may be given: A CHG solution to use in the shower or as part of a sponge bath. A prepackaged cloth that contains CHG. Cleaning your skin with CHG may help lower the risk for infection: While you are staying in the intensive care unit of the hospital. If you have a vascular access, such as a central line, to provide short-term or long-term access to your veins. If you have a catheter to drain urine from your bladder. If you are on a ventilator. A ventilator is a machine that helps you breathe by moving air in and out of your lungs. After surgery. What are the risks? Risks of using CHG include: A skin reaction. Hearing loss, if CHG gets in your ears. Eye injury, if CHG gets in your eyes and is not rinsed out. The CHG product catching  fire. Make sure that you avoid smoking and flames after applying CHG to your skin. Do not use CHG: If you have a chlorhexidine allergy or have previously reacted to chlorhexidine. On babies younger than 73 months of age. How to use CHG solution Use CHG only as told by your health care provider, and follow the instructions on the label. Use the full amount of CHG as directed. Usually, this is one bottle. During a shower Follow these steps when using CHG solution during a shower (unless your health care provider gives you different instructions): Start the shower. Use your normal soap  and shampoo to wash your face and hair. Turn off the shower or move out of the shower stream. Pour the CHG onto a clean washcloth. Do not use any type of brush or rough-edged sponge. Starting at your neck, lather your body down to your toes. Make sure you follow these instructions: If you will be having surgery, pay special attention to the part of your body where you will be having surgery. Scrub this area for at least 1 minute. Do not use CHG on your head or face. If the solution gets into your ears or eyes, rinse them well with water. Avoid your genital area. Avoid any areas of skin that have broken skin, cuts, or scrapes. Scrub your back and under your arms. Make sure to wash skin folds. Let the lather sit on your skin for 1-2 minutes or as long as told by your health care provider. Thoroughly rinse your entire body in the shower. Make sure that all body creases and crevices are rinsed well. Dry off with a clean towel. Do not put any substances on your body afterward--such as powder, lotion, or perfume--unless you are told to do so by your health care provider. Only use lotions that are recommended by the manufacturer. Put on clean clothes or pajamas. If it is the night before your surgery, sleep in clean sheets.  During a sponge bath Follow these steps when using CHG solution during a sponge bath (unless  your health care provider gives you different instructions): Use your normal soap and shampoo to wash your face and hair. Pour the CHG onto a clean washcloth. Starting at your neck, lather your body down to your toes. Make sure you follow these instructions: If you will be having surgery, pay special attention to the part of your body where you will be having surgery. Scrub this area for at least 1 minute. Do not use CHG on your head or face. If the solution gets into your ears or eyes, rinse them well with water. Avoid your genital area. Avoid any areas of skin that have broken skin, cuts, or scrapes. Scrub your back and under your arms. Make sure to wash skin folds. Let the lather sit on your skin for 1-2 minutes or as long as told by your health care provider. Using a different clean, wet washcloth, thoroughly rinse your entire body. Make sure that all body creases and crevices are rinsed well. Dry off with a clean towel. Do not put any substances on your body afterward--such as powder, lotion, or perfume--unless you are told to do so by your health care provider. Only use lotions that are recommended by the manufacturer. Put on clean clothes or pajamas. If it is the night before your surgery, sleep in clean sheets. How to use CHG prepackaged cloths Only use CHG cloths as told by your health care provider, and follow the instructions on the label. Use the CHG cloth on clean, dry skin. Do not use the CHG cloth on your head or face unless your health care provider tells you to. When washing with the CHG cloth: Avoid your genital area. Avoid any areas of skin that have broken skin, cuts, or scrapes. Before surgery Follow these steps when using a CHG cloth to clean before surgery (unless your health care provider gives you different instructions): Using the CHG cloth, vigorously scrub the part of your body where you will be having surgery. Scrub using a back-and-forth motion for 3 minutes. The  area on your  body should be completely wet with CHG when you are done scrubbing. Do not rinse. Discard the cloth and let the area air-dry. Do not put any substances on the area afterward, such as powder, lotion, or perfume. Put on clean clothes or pajamas. If it is the night before your surgery, sleep in clean sheets.  For general bathing Follow these steps when using CHG cloths for general bathing (unless your health care provider gives you different instructions). Use a separate CHG cloth for each area of your body. Make sure you wash between any folds of skin and between your fingers and toes. Wash your body in the following order, switching to a new cloth after each step: The front of your neck, shoulders, and chest. Both of your arms, under your arms, and your hands. Your stomach and groin area, avoiding the genitals. Your right leg and foot. Your left leg and foot. The back of your neck, your back, and your buttocks. Do not rinse. Discard the cloth and let the area air-dry. Do not put any substances on your body afterward--such as powder, lotion, or perfume--unless you are told to do so by your health care provider. Only use lotions that are recommended by the manufacturer. Put on clean clothes or pajamas. Contact a health care provider if: Your skin gets irritated after scrubbing. You have questions about using your solution or cloth. Get help right away if: Your eyes become very red or swollen. Your eyes itch badly. Your skin itches badly and is red or swollen. Your hearing changes. You have trouble seeing. You have swelling or tingling in your mouth or throat. You have trouble breathing. You swallow any chlorhexidine. Summary Chlorhexidine gluconate (CHG) is a germ-killing (antiseptic) solution that is used to clean the skin. Cleaning your skin with CHG may help to lower your risk for infection. You may be given CHG to use for bathing. It may be in a bottle or in a prepackaged  cloth to use on your skin. Carefully follow your health care provider's instructions and the instructions on the product label. Do not use CHG if you have a chlorhexidine allergy. Contact your health care provider if your skin gets irritated after scrubbing. This information is not intended to replace advice given to you by your health care provider. Make sure you discuss any questions you have with your healthcare provider. Document Revised: 01/08/2020 Document Reviewed: 02/12/2020 Elsevier Patient Education  [place].

## 2021-05-01 ENCOUNTER — Other Ambulatory Visit: Payer: Self-pay

## 2021-05-01 ENCOUNTER — Encounter (HOSPITAL_COMMUNITY)
Admission: RE | Admit: 2021-05-01 | Discharge: 2021-05-01 | Disposition: A | Discharge status: Home / Self Care | Payer: BC Managed Care – PPO | Source: Ambulatory Visit | Attending: Urology | Admitting: Urology

## 2021-05-01 ENCOUNTER — Encounter (HOSPITAL_COMMUNITY): Payer: Self-pay

## 2021-05-01 DIAGNOSIS — Z8616 Personal history of COVID-19: Secondary | ICD-10-CM | POA: Diagnosis not present

## 2021-05-01 DIAGNOSIS — N200 Calculus of kidney: Secondary | ICD-10-CM | POA: Diagnosis not present

## 2021-05-01 DIAGNOSIS — Z01818 Encounter for other preprocedural examination: Secondary | ICD-10-CM | POA: Insufficient documentation

## 2021-05-01 DIAGNOSIS — Z79899 Other long term (current) drug therapy: Secondary | ICD-10-CM | POA: Diagnosis not present

## 2021-05-01 DIAGNOSIS — Z9109 Other allergy status, other than to drugs and biological substances: Secondary | ICD-10-CM | POA: Diagnosis not present

## 2021-05-01 DIAGNOSIS — N202 Calculus of kidney with calculus of ureter: Secondary | ICD-10-CM | POA: Diagnosis not present

## 2021-05-01 DIAGNOSIS — Z87442 Personal history of urinary calculi: Secondary | ICD-10-CM | POA: Diagnosis not present

## 2021-05-04 ENCOUNTER — Ambulatory Visit (HOSPITAL_COMMUNITY)
Admission: RE | Admit: 2021-05-04 | Discharge: 2021-05-04 | Disposition: A | Discharge status: Home / Self Care | Payer: BC Managed Care – PPO | Attending: Urology | Admitting: Urology

## 2021-05-04 ENCOUNTER — Ambulatory Visit (HOSPITAL_COMMUNITY): Payer: BC Managed Care – PPO | Admitting: Anesthesiology

## 2021-05-04 ENCOUNTER — Encounter (HOSPITAL_COMMUNITY)
Admission: RE | Disposition: A | Discharge status: Home / Self Care | Payer: Self-pay | Source: Home / Self Care | Attending: Urology

## 2021-05-04 ENCOUNTER — Encounter (HOSPITAL_COMMUNITY): Payer: Self-pay | Admitting: Urology

## 2021-05-04 ENCOUNTER — Ambulatory Visit (HOSPITAL_COMMUNITY): Payer: BC Managed Care – PPO

## 2021-05-04 DIAGNOSIS — N200 Calculus of kidney: Secondary | ICD-10-CM | POA: Insufficient documentation

## 2021-05-04 DIAGNOSIS — Z87442 Personal history of urinary calculi: Secondary | ICD-10-CM | POA: Insufficient documentation

## 2021-05-04 DIAGNOSIS — Z8616 Personal history of COVID-19: Secondary | ICD-10-CM | POA: Insufficient documentation

## 2021-05-04 DIAGNOSIS — Z79899 Other long term (current) drug therapy: Secondary | ICD-10-CM | POA: Diagnosis not present

## 2021-05-04 DIAGNOSIS — Z9109 Other allergy status, other than to drugs and biological substances: Secondary | ICD-10-CM | POA: Insufficient documentation

## 2021-05-04 DIAGNOSIS — E890 Postprocedural hypothyroidism: Secondary | ICD-10-CM | POA: Diagnosis not present

## 2021-05-04 DIAGNOSIS — N202 Calculus of kidney with calculus of ureter: Secondary | ICD-10-CM | POA: Diagnosis not present

## 2021-05-04 HISTORY — PX: HOLMIUM LASER APPLICATION: SHX5852

## 2021-05-04 HISTORY — PX: CYSTOSCOPY WITH RETROGRADE PYELOGRAM, URETEROSCOPY AND STENT PLACEMENT: SHX5789

## 2021-05-04 LAB — GLUCOSE, CAPILLARY: Glucose-Capillary: 120 mg/dL — ABNORMAL HIGH (ref 70–99)

## 2021-05-04 SURGERY — CYSTOURETEROSCOPY, WITH RETROGRADE PYELOGRAM AND STENT INSERTION
Anesthesia: General | Site: Ureter | Laterality: Bilateral

## 2021-05-04 MED ORDER — LACTATED RINGERS IV SOLN: INTRAVENOUS | Status: DC

## 2021-05-04 MED ORDER — DIATRIZOATE MEGLUMINE 30 % UR SOLN
URETHRAL | Status: AC
  Filled 2021-05-04: qty 100

## 2021-05-04 MED ORDER — MIDAZOLAM HCL 2 MG/2ML IJ SOLN
INTRAMUSCULAR | Status: AC
  Filled 2021-05-04: qty 2

## 2021-05-04 MED ORDER — WATER FOR IRRIGATION, STERILE IR SOLN
Status: DC | PRN
  Administered 2021-05-04: 500 mL

## 2021-05-04 MED ORDER — FENTANYL CITRATE (PF) 100 MCG/2ML IJ SOLN
INTRAMUSCULAR | Status: AC
  Filled 2021-05-04: qty 2

## 2021-05-04 MED ORDER — MEPERIDINE HCL 50 MG/ML IJ SOLN: 6.2500 mg | INTRAMUSCULAR | Status: DC | PRN

## 2021-05-04 MED ORDER — ONDANSETRON HCL 4 MG/2ML IJ SOLN
INTRAMUSCULAR | Status: AC
  Filled 2021-05-04: qty 2

## 2021-05-04 MED ORDER — OXYCODONE-ACETAMINOPHEN 5-325 MG PO TABS: 1.0000 | ORAL_TABLET | ORAL | 0 refills | Status: DC | PRN

## 2021-05-04 MED ORDER — PROPOFOL 10 MG/ML IV BOLUS
INTRAVENOUS | Status: AC
  Filled 2021-05-04: qty 20

## 2021-05-04 MED ORDER — HYDROCODONE-ACETAMINOPHEN 5-325 MG PO TABS
ORAL_TABLET | ORAL | Status: AC
  Filled 2021-05-04: qty 1

## 2021-05-04 MED ORDER — PROPOFOL 10 MG/ML IV BOLUS
INTRAVENOUS | Status: DC | PRN
  Administered 2021-05-04: 200 mg via INTRAVENOUS

## 2021-05-04 MED ORDER — SODIUM CHLORIDE 0.9 % IR SOLN
Status: DC | PRN
  Administered 2021-05-04 (×2): 3000 mL

## 2021-05-04 MED ORDER — CEFAZOLIN SODIUM-DEXTROSE 2-4 GM/100ML-% IV SOLN
2.0000 g | INTRAVENOUS | Status: AC
  Administered 2021-05-04: 2 g via INTRAVENOUS
  Filled 2021-05-04: qty 100

## 2021-05-04 MED ORDER — LIDOCAINE HCL (PF) 2 % IJ SOLN
INTRAMUSCULAR | Status: AC
  Filled 2021-05-04: qty 5

## 2021-05-04 MED ORDER — LIDOCAINE HCL (CARDIAC) PF 100 MG/5ML IV SOSY
PREFILLED_SYRINGE | INTRAVENOUS | Status: DC | PRN
  Administered 2021-05-04: 100 mg via INTRAVENOUS

## 2021-05-04 MED ORDER — MIDAZOLAM HCL 5 MG/5ML IJ SOLN
INTRAMUSCULAR | Status: DC | PRN
  Administered 2021-05-04: 2 mg via INTRAVENOUS

## 2021-05-04 MED ORDER — FENTANYL CITRATE (PF) 100 MCG/2ML IJ SOLN
INTRAMUSCULAR | Status: DC | PRN
  Administered 2021-05-04 (×2): 50 ug via INTRAVENOUS

## 2021-05-04 MED ORDER — SCOPOLAMINE 1 MG/3DAYS TD PT72
MEDICATED_PATCH | TRANSDERMAL | Status: AC
  Administered 2021-05-04: 1.5 mg via TRANSDERMAL
  Filled 2021-05-04: qty 1

## 2021-05-04 MED ORDER — DIATRIZOATE MEGLUMINE 30 % UR SOLN
URETHRAL | Status: DC | PRN
  Administered 2021-05-04: 20 mL via URETHRAL

## 2021-05-04 MED ORDER — ORAL CARE MOUTH RINSE: 15.0000 mL | Freq: Once | OROMUCOSAL | Status: AC

## 2021-05-04 MED ORDER — CHLORHEXIDINE GLUCONATE 0.12 % MT SOLN
15.0000 mL | Freq: Once | OROMUCOSAL | Status: AC
  Administered 2021-05-04: 15 mL via OROMUCOSAL
  Filled 2021-05-04: qty 15

## 2021-05-04 MED ORDER — ONDANSETRON HCL 4 MG/2ML IJ SOLN
INTRAMUSCULAR | Status: DC | PRN
  Administered 2021-05-04: 4 mg via INTRAVENOUS

## 2021-05-04 MED ORDER — DEXAMETHASONE SODIUM PHOSPHATE 10 MG/ML IJ SOLN
INTRAMUSCULAR | Status: AC
  Filled 2021-05-04: qty 1

## 2021-05-04 MED ORDER — SCOPOLAMINE 1 MG/3DAYS TD PT72: 1.0000 | MEDICATED_PATCH | Freq: Once | TRANSDERMAL | Status: DC

## 2021-05-04 MED ORDER — DEXAMETHASONE SODIUM PHOSPHATE 4 MG/ML IJ SOLN
INTRAMUSCULAR | Status: DC | PRN
  Administered 2021-05-04: 10 mg via INTRAVENOUS

## 2021-05-04 MED ORDER — HYDROCODONE-ACETAMINOPHEN 5-325 MG PO TABS
1.0000 | ORAL_TABLET | Freq: Once | ORAL | Status: AC
  Administered 2021-05-04: 1 via ORAL

## 2021-05-04 MED ORDER — HYDROMORPHONE HCL 1 MG/ML IJ SOLN
0.2500 mg | INTRAMUSCULAR | Status: DC | PRN
  Administered 2021-05-04: 0.5 mg via INTRAVENOUS
  Filled 2021-05-04: qty 0.5

## 2021-05-04 MED ORDER — ONDANSETRON HCL 4 MG/2ML IJ SOLN: 4.0000 mg | Freq: Once | INTRAMUSCULAR | Status: DC | PRN

## 2021-05-04 SURGICAL SUPPLY — 27 items
BAG DRAIN URO TABLE W/ADPT NS (BAG) ×3 IMPLANT
BAG DRN 8 ADPR NS SKTRN CSTL (BAG) ×2
BAG HAMPER (MISCELLANEOUS) ×3 IMPLANT
CATH INTERMIT  6FR 70CM (CATHETERS) ×3 IMPLANT
CLOTH BEACON ORANGE TIMEOUT ST (SAFETY) ×3 IMPLANT
EXTRACTOR STONE NITINOL NGAGE (UROLOGICAL SUPPLIES) ×3 IMPLANT
GLOVE SURG POLYISO LF SZ8 (GLOVE) ×3 IMPLANT
GLOVE SURG UNDER POLY LF SZ7 (GLOVE) ×6 IMPLANT
GOWN STRL REUS W/TWL LRG LVL3 (GOWN DISPOSABLE) ×3 IMPLANT
GOWN STRL REUS W/TWL XL LVL3 (GOWN DISPOSABLE) ×3 IMPLANT
GUIDEWIRE STR DUAL SENSOR (WIRE) ×3 IMPLANT
GUIDEWIRE STR ZIPWIRE 035X150 (MISCELLANEOUS) ×3 IMPLANT
IV NS IRRIG 3000ML ARTHROMATIC (IV SOLUTION) ×6 IMPLANT
KIT TURNOVER CYSTO (KITS) ×3 IMPLANT
MANIFOLD NEPTUNE II (INSTRUMENTS) ×3 IMPLANT
NEEDLE HYPO 18GX1.5 BLUNT FILL (NEEDLE) ×3 IMPLANT
PACK CYSTO (CUSTOM PROCEDURE TRAY) ×3 IMPLANT
PAD ARMBOARD 7.5X6 YLW CONV (MISCELLANEOUS) ×3 IMPLANT
SHEATH URETERAL 12FRX35CM (MISCELLANEOUS) ×3 IMPLANT
STENT URET 6FRX24 CONTOUR (STENTS) ×6 IMPLANT
SYR 10ML LL (SYRINGE) ×3 IMPLANT
SYR 30ML LL (SYRINGE) ×3 IMPLANT
SYR CONTROL 10ML LL (SYRINGE) ×3 IMPLANT
TOWEL OR 17X26 4PK STRL BLUE (TOWEL DISPOSABLE) ×3 IMPLANT
TRACTIP FLEXIVA PULS ID 200XHI (Laser) ×2 IMPLANT
TRACTIP FLEXIVA PULSE ID 200 (Laser) ×3
WATER STERILE IRR 500ML POUR (IV SOLUTION) ×3 IMPLANT

## 2021-05-04 NOTE — Interval H&P Note (Signed)
History and Physical Interval Note:  05/04/2021 7:33 AM  Ann Peterson  has presented today for surgery, with the diagnosis of bilateral renal calculi.  The various methods of treatment have been discussed with the patient and family. After consideration of risks, benefits and other options for treatment, the patient has consented to  Procedure(s): CYSTOSCOPY WITH RETROGRADE PYELOGRAM, URETEROSCOPY AND STENT PLACEMENT (Bilateral) HOLMIUM LASER APPLICATION (Bilateral) as a surgical intervention.  [name] history has been reviewed, patient examined, no change in status, stable for surgery.  I have reviewed the patient's chart and labs.  Questions were answered to the patient's satisfaction.     Nicolette Bang

## 2021-05-04 NOTE — Op Note (Signed)
Marland KitchenPreoperative diagnosis: bilateral renal calculi  Postoperative diagnosis: Same  Procedure: 1 cystoscopy 2. bilateralretrograde pyelography 3.  Intraoperative fluoroscopy, under one hour, with interpretation 4.  Bilateral ureteroscopic stone manipulation with laser lithotripsy and basket extraction 5.  bilateral 6 x 24 JJ stent placement  Attending: Rosie Fate  Anesthesia: General  Estimated blood loss: None  Drains: bilateral 6 x 24 JJ ureteral stent with tether  Specimens: stone for analysis  Antibiotics: ancef  Findings: bilateral mid and lower pole renal calculi. No hydronephrosis. No masses/lesions in the bladder. Ureteral orifices in normal anatomic location.  Indications: Patient is a 55 year old female with a history of bilateral renal calculi and bilateral flank pain. After discussing treatment options, they decided proceed with bilateral ureteroscopic stone manipulation.  Procedure her in detail: The patient was brought to the operating room and a brief timeout was done to ensure correct patient, correct procedure, correct site.  General anesthesia was administered patient was placed in dorsal lithotomy position.  Her genitalia was then prepped and draped in usual sterile fashion.  A rigid 71 French cystoscope was passed in the urethra and the bladder.  Bladder was inspected free masses or lesions.  the ureteral orifices were in the normal orthotopic locations. a 6 french ureteral catheter was then instilled into the left ureteral orifice.  a gentle retrograde was obtained and findings noted above. We then advanced a zipwire through the catheter and up to the renal pelvis.  we then removed the cystoscope and cannulated the left ureteral orifice with a semirigid ureteroscope.  We located no stone in the ureter. We then placed a sensor wire up to the renal pelvis. We removed the scope and advanced a 12/14 x 35cm access sheath up to the renal pelvis. We then used the flexible  ureteroscope to perform nephroscopy. We located calculi in the mid and lower poles which were removed with an NGage basket. Once the stone were removed we then removed the access sheath under direct vision and noted to injury to the ureter.  We then placed a 6 x 24 double-j ureteral stent over the original zip wire. We then removed the wire and good coil was noted in the the renal pelvis under fluoroscopy and the bladder under direct vision.  We then turned out attention to the right side.  6 french ureteral catheter was then instilled into the right ureteral orifice.  a gentle retrograde was obtained and findings noted above. We then advanced a zipwire through the catheter and up to the renal pelvis. we then removed the cystoscope and cannulated the right ureteral orifice with a semirigid ureteroscope.  We located no stone in the ureter. We then placed a sensor wire up to the renal pelvis. We removed the scope and advanced a 12/14 x 35cm access sheath up to the renal pelvis. We then used the flexible ureteroscope to perform nephroscopy. We located calculi in the mid and lower poles which were removed with an NGage basket. A larger calculus was discovered in the lower pole which was fragmented with a  242nm laser fiber. The fragments were then removed with a NGage basket. Once the stone were removed we then removed the access sheath under direct vision and noted to injury to the ureter. we then placed a 6 x 24 double-j ureteral stent over the original zip wire.  We then removed the wire and good coil was noted in the the renal pelvis under fluoroscopy and the bladder under direct vision.   the  bladder was then drained and this concluded the procedure which was well tolerated by patient.  Complications: None  Condition: Stable, extubated, transferred to PACU  Plan: Patient is to be discharged home as to follow-up in 2 weeks. She is to remove her stents by pulling the tether in 72 hours

## 2021-05-04 NOTE — Anesthesia Procedure Notes (Signed)
Procedure Name: LMA Insertion Date/Time: 05/04/2021 7:54 AM Performed by: Riki Sheer, CRNA Pre-anesthesia Checklist: Patient identified, Emergency Drugs available, Suction available, Patient being monitored and Timeout performed Patient Re-evaluated:Patient Re-evaluated prior to induction Oxygen Delivery Method: Circle system utilized Preoxygenation: Pre-oxygenation with 100% oxygen Induction Type: IV induction Ventilation: Mask ventilation without difficulty LMA: LMA inserted LMA Size: 4.0 Number of attempts: 1 Placement Confirmation: positive ETCO2, CO2 detector and breath sounds checked- equal and bilateral Tube secured with: Tape Dental Injury: Teeth and Oropharynx as per pre-operative assessment  Comments: LMA placed easily

## 2021-05-04 NOTE — Transfer of Care (Signed)
Immediate Anesthesia Transfer of Care Note  Patient: Ann Peterson  Procedure(s) Performed: CYSTOSCOPY WITH RETROGRADE PYELOGRAM, URETEROSCOPY AND STENT PLACEMENT (Bilateral: Ureter) HOLMIUM LASER APPLICATION (Bilateral)  Patient Location: PACU  Anesthesia Type:General  Level of Consciousness: awake and drowsy  Airway & Oxygen Therapy: Patient Spontanous Breathing and Patient connected to nasal cannula oxygen  Post-op Assessment: Report given to RN and Post -op Vital signs reviewed and stable  Post vital signs: Reviewed and stable  Last Vitals:  Vitals Value Taken Time  BP 139/79 05/04/21 0902  Temp    Pulse 69 05/04/21 0903  Resp 18 05/04/21 0903  SpO2 93 % 05/04/21 0903  Vitals shown include unvalidated device data.  Last Pain:  Vitals:   05/04/21 0707  TempSrc: Oral  PainSc: 6       Patients Stated Pain Goal: 5 ([id number])  Complications: No notable events documented.

## 2021-05-04 NOTE — Anesthesia Postprocedure Evaluation (Signed)
Anesthesia Post Note  Patient: Naoko Coppa  Procedure(s) Performed: CYSTOSCOPY WITH RETROGRADE PYELOGRAM, URETEROSCOPY AND STENT PLACEMENT (Bilateral: Ureter) HOLMIUM LASER APPLICATION (Bilateral)  Patient location during evaluation: PACU Anesthesia Type: General Level of consciousness: awake and alert and oriented Pain management: satisfactory to patient Vital Signs Assessment: post-procedure vital signs reviewed and stable Respiratory status: spontaneous breathing and respiratory function stable Cardiovascular status: blood pressure returned to baseline and stable Postop Assessment: no apparent nausea or vomiting Anesthetic complications: no   No notable events documented.   Last Vitals:  Vitals:   05/04/21 0930 05/04/21 0953  BP: 123/67 138/76  Pulse: 77 74  Resp: 14 18  Temp:  36.8 C  SpO2:  93%    Last Pain:  Vitals:   05/04/21 0953  TempSrc: Oral  PainSc: 8                  Kenita Bines C Pearlena Ow

## 2021-05-04 NOTE — Anesthesia Preprocedure Evaluation (Addendum)
Anesthesia Evaluation  Patient identified by MRN, date of birth, ID band Patient awake    Reviewed: Allergy & Precautions, NPO status , Patient's Chart, lab work & pertinent test results, reviewed documented beta blocker date and time   History of Anesthesia Complications (+) PONV and history of anesthetic complications  Airway Mallampati: I  TM Distance: >3 FB Neck ROM: Full    Dental  (+) Dental Advisory Given   Pulmonary sleep apnea and Continuous Positive Airway Pressure Ventilation ,    Pulmonary exam normal breath sounds clear to auscultation       Cardiovascular Exercise Tolerance: Good hypertension, Pt. on medications Normal cardiovascular exam Rhythm:Regular Rate:Normal     Neuro/Psych PSYCHIATRIC DISORDERS negative neurological ROS     GI/Hepatic negative GI ROS, Neg liver ROS,   Endo/Other  Hypothyroidism (thyroid cancer)   Renal/GU Renal disease (stones)     Musculoskeletal   Abdominal   Peds  (+) ATTENTION DEFICIT DISORDER WITHOUT HYPERACTIVITY Hematology negative hematology ROS (+)   Anesthesia Other Findings   Reproductive/Obstetrics                           Anesthesia Physical Anesthesia Plan  ASA: 3  Anesthesia Plan: General   Post-op Pain Management:    Induction: Intravenous  PONV Risk Score and Plan: 4 or greater and Ondansetron, Dexamethasone and Midazolam  Airway Management Planned: LMA  Additional Equipment:   Intra-op Plan:   Post-operative Plan: Extubation in OR  Informed Consent: I have reviewed the patients History and Physical, chart, labs and discussed the procedure including the risks, benefits and alternatives for the proposed anesthesia with the patient or authorized representative who has indicated his/her understanding and acceptance.     Dental advisory given  Plan Discussed with: CRNA and Surgeon  Anesthesia Plan Comments:        Anesthesia Quick Evaluation

## 2021-05-05 ENCOUNTER — Encounter (HOSPITAL_COMMUNITY): Payer: Self-pay | Admitting: Urology

## 2021-05-09 ENCOUNTER — Other Ambulatory Visit: Payer: Self-pay

## 2021-05-09 ENCOUNTER — Ambulatory Visit: Payer: BC Managed Care – PPO | Admitting: Pharmacist

## 2021-05-09 LAB — CALCULI, WITH PHOTOGRAPH (CLINICAL LAB)
Calcium Oxalate Dihydrate: 20 %
Calcium Oxalate Monohydrate: 80 %
Weight Calculi: 66 mg

## 2021-05-09 MED ORDER — SAXENDA 18 MG/3ML ~~LOC~~ SOPN: 3.0000 mg | PEN_INJECTOR | Freq: Every day | SUBCUTANEOUS | 5 refills | Status: DC

## 2021-05-09 MED ORDER — BD PEN NEEDLE MICRO U/F 32G X 6 MM MISC: 1 refills | Status: DC

## 2021-05-09 NOTE — Progress Notes (Signed)
   05/09/2021 Name: Jacqueli Limones MRN: [id number] DOB: 1965-12-10   S:  74 YOF Presents for weight loss evaluation, education, and management.  Patient was referred and last seen by Primary Care Provider on 03/07/21.  She is seeking pharmacotherapy for weight loss.  Kirke Shaggy is preferred on her insurance. We would like to switch her to once weekly wegovy when it becomes available again.     Insurance coverage/medication affordability: [employer] employee  [employer]    Patient reports adherence with medications. Current medications for weight loss: N/A   She reports she has difficulty with snacking/carbs   Discussed meal planning options and Plate method for healthy eating Avoid sugary drinks and desserts Incorporate balanced protein, non starchy veggies, 1 serving of carbohydrate with each meal Increase water intake Increase physical activity as able   Goal weight is around  150lbs per BMI chart     A/P:   -Healthy eating and meal planning discussed   -Increased water and exercise   -START SAXENDA  START: 0.'6MG'$  INTO THE SKIN ONCE DAILY FOR 1 WEEK INCREASE WEEKLY IN INCREMENTS OF 0.'6MG'$ /DAY UNTIL MAINTENANCE DOSAGE OF 3 MG ONCE DAILY IS REACHED  PEN NEEDLES CALLED IN   Patient denies history of thyroid/medullary cancer.             Work to eat low fat smaller meals to reduce side effects             Decreased carbonated beverages   Written patient instructions provided.  Total time in face to face counseling 25 minutes.   Regina Eck, PharmD, BCPS Clinical Pharmacist, [place]  [facility] Phone [phone]

## 2021-05-10 ENCOUNTER — Ambulatory Visit: Payer: BC Managed Care – PPO | Admitting: Urology

## 2021-05-10 ENCOUNTER — Other Ambulatory Visit: Payer: Self-pay

## 2021-05-10 ENCOUNTER — Encounter: Payer: Self-pay | Admitting: Urology

## 2021-05-10 VITALS — BP 142/80 | HR 81

## 2021-05-10 DIAGNOSIS — N200 Calculus of kidney: Secondary | ICD-10-CM

## 2021-05-10 LAB — MICROSCOPIC EXAMINATION
Bacteria, UA: NONE SEEN
Epithelial Cells (non renal): >10 /hpf — AB (ref 0–10)
Renal Epithel, UA: NONE SEEN /hpf

## 2021-05-10 LAB — URINALYSIS, ROUTINE W REFLEX MICROSCOPIC
Bilirubin, UA: NEGATIVE
Glucose, UA: NEGATIVE
Ketones, UA: NEGATIVE
Leukocytes,UA: NEGATIVE
Nitrite, UA: NEGATIVE
Protein,UA: NEGATIVE
Specific Gravity, UA: 1.02 (ref 1.005–1.030)
Urobilinogen, Ur: 0.2 mg/dL (ref 0.2–1.0)
pH, UA: 6.5 (ref 5.0–7.5)

## 2021-05-10 NOTE — Progress Notes (Signed)
05/10/2021 2:31 PM   Ann Peterson Seen [birth year] [id number]  Referring provider: Janora Norlander, DO [place],  [place] [id number]  Followup nephrolithiasis   HPI: Ann Peterson is a 55yo here for followup for nephrolithiasis. She removed her stent POD#3. No flank pain currently. Stone composition CaOx. No worsening LUTS. No hematuria. No other complaints today   PMH: Past Medical History:  Diagnosis Date   Cancer ([place]) 2000   thryoid papillary carcinoma   COVID-19 12/03/2019   body aches, joint pain, fever 104 x 4 days, then symptoms resolved   History of kidney stones    Hyperlipidemia    Hypertension    PONV (postoperative nausea and vomiting)    n/v after appendectomy 5-6 yrs ago   Sleep apnea    uses cpap   Thyroid disease    thyroidectomy due to cancer    Surgical History: Past Surgical History:  Procedure Laterality Date   ABDOMINAL HYSTERECTOMY  09/12/2016   partial   APPENDECTOMY  2011   CESAREAN SECTION     x 3   CHOLECYSTECTOMY  2000   laproscopic   CYSTOSCOPY W/ URETERAL STENT PLACEMENT N/A 12/03/2019   Procedure: CYSTOSCOPY WITH STENT PLACEMENT, RETROGRADE;  Surgeon: Cleon Gustin, MD;  Location: [facility];  Service: Urology;  Laterality: N/A;   CYSTOSCOPY WITH RETROGRADE PYELOGRAM, URETEROSCOPY AND STENT PLACEMENT Left 12/24/2019   Procedure: CYSTOSCOPY WITH RETROGRADE PYELOGRAM, URETEROSCOPY, STENT REMOVAL AND STONE EXTRACTION;  Surgeon: Cleon Gustin, MD;  Location: [facility];  Service: Urology;  Laterality: Left;   CYSTOSCOPY WITH RETROGRADE PYELOGRAM, URETEROSCOPY AND STENT PLACEMENT Bilateral 05/04/2021   Procedure: CYSTOSCOPY WITH RETROGRADE PYELOGRAM, URETEROSCOPY AND STENT PLACEMENT;  Surgeon: Cleon Gustin, MD;  Location: [facility];  Service: Urology;  Laterality: Bilateral;   HOLMIUM LASER APPLICATION Bilateral 0000000   Procedure: HOLMIUM LASER APPLICATION;  Surgeon: Cleon Gustin, MD;  Location: [facility];  Service: Urology;  Laterality: Bilateral;   TOTAL THYROIDECTOMY  2000   sx and radiation done    Home Medications:  Allergies as of 05/10/2021       Reactions   Pollen Extract         Medication List        Accurate as of May 10, 2021  2:31 PM. If you have any questions, ask your nurse or doctor.          albuterol 108 (90 Base) MCG/ACT inhaler Commonly known as: VENTOLIN HFA Inhale 2 puffs into the lungs every 6 (six) hours as needed for wheezing or shortness of breath.   amLODipine 10 MG tablet Commonly known as: NORVASC Take 1 tablet (10 mg total) by mouth daily.   BD Pen Needle Micro U/F 32G X 6 MM Misc Generic drug: Insulin Pen Needle Use to inject saxenda daily   BIOTIN PO Take 1 capsule by mouth every evening.   fluticasone 50 MCG/ACT nasal spray Commonly known as: FLONASE SPRAY 2 SPRAYS INTO EACH NOSTRIL EVERY DAY What changed: See the new instructions.   levothyroxine 175 MCG tablet Commonly known as: SYNTHROID TAKE 1 TABLET BY MOUTH EVERY DAY What changed: when to take this   magnesium oxide 400 MG tablet Commonly known as: MAG-OX Take 400 mg by mouth daily.   metFORMIN 500 MG tablet Commonly known as: GLUCOPHAGE Take 1 tablet (500 mg total) by mouth daily with breakfast.   metoprolol succinate 100 MG 24 hr tablet Commonly known as: TOPROL-XL Take 1 tablet (  100 mg total) by mouth daily. With or immediately following a meal   oxyCODONE-acetaminophen 5-325 MG tablet Commonly known as: Percocet Take 1 tablet by mouth every 4 (four) hours as needed.   predniSONE 10 MG (21) Tbpk tablet Commonly known as: STERAPRED UNI-PAK 21 TAB Use as directed on back of pill pack   Saxenda 18 MG/3ML Sopn Generic drug: Liraglutide -Weight Management Inject 3 mg into the skin daily.   spironolactone 50 MG tablet Commonly known as: Aldactone Take 1 tablet (50 mg total) by mouth 2 (two) times daily.   traZODone 50 MG tablet Commonly known as:  DESYREL TAKE 0.5-2 TABLETS (25-100 MG TOTAL) BY MOUTH AT BEDTIME AS NEEDED FOR SLEEP.   TURMERIC PO Take 1 capsule by mouth at bedtime.   Vitamin D 50 MCG (2000 UT) Caps Take 1 capsule by mouth every evening.        Allergies:  Allergies  Allergen Reactions   Pollen Extract     Family History: Family History  Problem Relation Age of Onset   Hypertension Mother    Dementia Mother    Macular degeneration Mother    Stroke Mother    COPD Father        lung cancer   Cancer Brother        colon cancer   Cancer Brother        brain tumor    Social History:  reports that she has never smoked. She has never used smokeless tobacco. She reports that she does not drink alcohol and does not use drugs.  ROS: All other review of systems were reviewed and are negative except what is noted above in HPI  Physical Exam: BP (!) 142/80   Pulse 81   LMP 09/07/2016 (Exact Date)   Constitutional:  Alert and oriented, No acute distress. HEENT: [place] AT, moist mucus membranes.  Trachea midline, no masses. Cardiovascular: No clubbing, cyanosis, or edema. Respiratory: Normal respiratory effort, no increased work of breathing. GI: Abdomen is soft, nontender, nondistended, no abdominal masses GU: No CVA tenderness.  Lymph: No cervical or inguinal lymphadenopathy. Skin: No rashes, bruises or suspicious lesions. Neurologic: Grossly intact, no focal deficits, moving all 4 extremities. Psychiatric: Normal mood and affect.  Laboratory Data: Lab Results  Component Value Date   WBC 6.7 03/07/2021   HGB 13.4 03/07/2021   HCT 39.0 03/07/2021   MCV 91 03/07/2021   PLT 183 03/07/2021    Lab Results  Component Value Date   CREATININE 0.68 04/04/2021    No results found for: PSA  No results found for: TESTOSTERONE  Lab Results  Component Value Date   HGBA1C 5.7 03/07/2021    Urinalysis    Component Value Date/Time   COLORURINE YELLOW 07/03/2018 1732   APPEARANCEUR Clear 04/04/2021  1604   LABSPEC 1.015 07/03/2018 1732   PHURINE 7.5 07/03/2018 1732   GLUCOSEU Negative 04/04/2021 1604   HGBUR SMALL (A) 07/03/2018 1732   BILIRUBINUR Negative 04/04/2021 [place] 07/03/2018 1732   PROTEINUR Negative 04/04/2021 1604   PROTEINUR NEGATIVE 07/03/2018 1732   UROBILINOGEN 0.2 02/20/2014 0845   NITRITE Negative 04/04/2021 1604   NITRITE NEGATIVE 07/03/2018 1732   LEUKOCYTESUR Negative 04/04/2021 1604    Lab Results  Component Value Date   LABMICR See below: 04/04/2021   WBCUA None seen 04/04/2021   LABEPIT None seen 04/04/2021   MUCUS Present 04/04/2021   BACTERIA None seen 04/04/2021    Pertinent Imaging:  Results for orders  placed in visit on 11/30/19  DG Abd 1 View  Narrative CLINICAL DATA:  LEFT flank pain radiating to groin, history of kidney stones  EXAM: ABDOMEN - 1 VIEW  COMPARISON:  10/05/2013  FINDINGS: Multiple tiny BILATERAL renal calculi up to 4 mm RIGHT and 3 mm LEFT.  In addition, 5 x 3 mm calculus at approximately the LEFT ureteropelvic junction, new.  No additional potential urinary tract calcifications.  Small LEFT pelvic phleboliths stable.  Levoconvex thoracolumbar scoliosis.  Bowel gas pattern normal.  IMPRESSION: Multiple BILATERAL renal calculi within additional 5 x 3 mm calculus projecting over the LEFT ureteropelvic junction region.   Electronically Signed By: Lavonia Dana M.D. On: 11/30/2019 16:57  No results found for this or any previous visit.  No results found for this or any previous visit.  No results found for this or any previous visit.  Results for orders placed during the hospital encounter of 02/17/21  US RENAL  Narrative CLINICAL DATA:  Known nephrolithiasis  EXAM: RENAL / URINARY TRACT ULTRASOUND COMPLETE  COMPARISON:  09/05/2020  FINDINGS: Right Kidney:  Renal measurements: 12.6 x 5.5 x 6.2 cm = volume: 2-2.8 mL. Echogenicity is within normal limits. Several  echogenic, shadowing calculi are present, largest in the lower pole measures up to 8 mm in diameter. At most mild hydronephrosis with pelvicaliectasis. Persists postvoiding. No concerning renal mass.  Left Kidney:  Renal measurements: 13.9 x 6.5 x 6 cm = volume: 284.4 mL. Echogenicity is within normal limits. Several echogenic, shadowing calculi are present, largest present in the lower pole measures up to 8 mm in size. At most mild hydronephrosis with pelvicaliectasis which persists postvoiding. No concerning renal mass.  Bladder:  Bilateral bladder jets are seen.  Other:  Echogenic appearance of the liver with loss of portal triad definition compatible with hepatic steatosis.  IMPRESSION: Bilateral nonobstructing nephroliths are present.  At most mild hydronephrosis/pelvicaliectasis which persists postvoiding and with visualization of the bilateral bladder jets, nonspecific.  Hepatic steatosis.   Electronically Signed By: Lovena Le M.D. On: 02/20/2021 00:21  No results found for this or any previous visit.  No results found for this or any previous visit.  Results for orders placed during the hospital encounter of 09/05/20  CT RENAL STONE STUDY  Narrative CLINICAL DATA:  Left-sided abdominal pain  EXAM: CT ABDOMEN AND PELVIS WITHOUT CONTRAST  TECHNIQUE: Multidetector CT imaging of the abdomen and pelvis was performed following the standard protocol without IV contrast.  COMPARISON:  12/03/2019  FINDINGS: Lower chest: No acute abnormality.  Hepatobiliary: Hepatic steatosis. Cholecystectomy. No biliary dilatation.  Pancreas: Unremarkable  Spleen: Unremarkable.  Adrenals/Urinary Tract: Adrenals are unremarkable. No hydronephrosis. Bilateral nonobstructing renal calculi measuring up to 4 mm. No ureteral calculi. Bladder is not well distended but otherwise unremarkable.  Stomach/Bowel: Stomach is within normal limits. 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 Bowel is normal in caliber.  Vascular/Lymphatic: Aortic atherosclerosis. No enlarged lymph nodes.  Reproductive: Status post hysterectomy. No adnexal masses.  Other: No ascites.  Abdominal wall is unremarkable.  Musculoskeletal: Lumbar spine degenerative changes. No acute osseous abnormality.  IMPRESSION: No obstructing calculus or hydronephrosis. Bilateral nonobstructing renal calculi.  Hepatic steatosis.  Aortic Atherosclerosis (ICD10-I70.0).   Electronically Signed By: Macy Mis M.D. On: 09/05/2020 15:20   Assessment & Plan:    1. Nephrolithiasis -RTC 4-6 weeks with renal US -24 hour urine collection - Urinalysis, Routine w reflex microscopic   No follow-ups on file.  Nicolette Bang, MD  [facility] [place]

## 2021-05-10 NOTE — Patient Instructions (Signed)
Dietary Guidelines to Help Prevent Kidney Stones Kidney stones are deposits of minerals and salts that form inside your kidneys. Your risk of developing kidney stones may be greater depending on your diet, your lifestyle, the medicines you take, and whether you have certain medical conditions. Most people can lower their chances of developing kidney stones by following the instructions below. Your dietitian may give you more specific instructions depending on your overall health and the type of kidney stones you tend to develop. What are tips for following this plan? Reading food labels  Choose foods with "no salt added" or "low-salt" labels. Limit your salt (sodium) intake to less than 1,500 mg a day. Choose foods with calcium for each meal and snack. Try to eat about 300 mg of calcium at each meal. Foods that contain 200-500 mg of calcium a serving include: 8 oz (237 mL) of milk, calcium-fortifiednon-dairy milk, and calcium-fortifiedfruit juice. Calcium-fortified means that calcium has been added to these drinks. 8 oz (237 mL) of kefir, yogurt, and soy yogurt. 4 oz (114 g) of tofu. 1 oz (28 g) of cheese. 1 cup (150 g) of dried figs. 1 cup (91 g) of cooked broccoli. One 3 oz (85 g) can of sardines or mackerel. Most people need 1,000-1,500 mg of calcium a day. Talk to your dietitian about how much calcium is recommended for you. Shopping Buy plenty of fresh fruits and vegetables. Most people do not need to avoid fruits and vegetables, even if these foods contain nutrients that may contribute to kidney stones. When shopping for convenience foods, choose: Whole pieces of fruit. Pre-made salads with dressing on the side. Low-fat fruit and yogurt smoothies. Avoid buying frozen meals or prepared deli foods. These can be high in sodium. Look for foods with live cultures, such as yogurt and kefir. Choose high-fiber grains, such as whole-wheat breads, oat bran, and wheat cereals. Cooking Do not add  salt to food when cooking. Place a salt shaker on the table and allow each person to add his or her own salt to taste. Use vegetable protein, such as beans, textured vegetable protein (TVP), or tofu, instead of meat in pasta, casseroles, and soups. Meal planning Eat less salt, if told by your dietitian. To do this: Avoid eating processed or pre-made food. Avoid eating fast food. Eat less animal protein, including cheese, meat, poultry, or fish, if told by your dietitian. To do this: Limit the number of times you have meat, poultry, fish, or cheese each week. Eat a diet free of meat at least 2 days a week. Eat only one serving each day of meat, poultry, fish, or seafood. When you prepare animal protein, cut pieces into small portion sizes. For most meat and fish, one serving is about the size of the palm of your hand. Eat at least five servings of fresh fruits and vegetables each day. To do this: Keep fruits and vegetables on hand for snacks. Eat one piece of fruit or a handful of berries with breakfast. Have a salad and fruit at lunch. Have two kinds of vegetables at dinner. Limit foods that are high in a substance called oxalate. These include: Spinach (cooked), rhubarb, beets, sweet potatoes, and Swiss chard. Peanuts. Potato chips, french fries, and baked potatoes with skin on. Nuts and nut products. Chocolate. If you regularly take a diuretic medicine, make sure to eat at least 1 or 2 servings of fruits or vegetables that are high in potassium each day. These include: Avocado. Banana. Orange, prune,   carrot, or tomato juice. Baked potato. Cabbage. Beans and split peas. Lifestyle  Drink enough fluid to keep your urine pale yellow. This is the most important thing you can do. Spread your fluid intake throughout the day. If you drink alcohol: Limit how much you use to: 0-1 drink a day for women who are not pregnant. 0-2 drinks a day for men. Be aware of how much alcohol is in your  drink. In the [place] one drink equals one 12 oz bottle of beer (355 mL), one 5 oz glass of wine (148 mL), or one 1 oz glass of hard liquor (44 mL). Lose weight if told by your health care provider. Work with your dietitian to find an eating plan and weight loss strategies that work best for you. General information Talk to your health care provider and dietitian about taking daily supplements. You may be told the following depending on your health and the cause of your kidney stones: Not to take supplements with vitamin C. To take a calcium supplement. To take a daily probiotic supplement. To take other supplements such as magnesium, fish oil, or vitamin B6. Take over-the-counter and prescription medicines only as told by your health care provider. These include supplements. What foods should I limit? Limit your intake of the following foods, or eat them as told by your dietitian. Vegetables Spinach. Rhubarb. Beets. Canned vegetables. Pickles. Olives. Baked potatoes with skin. Grains Wheat bran. Baked goods. Salted crackers. Cereals high in sugar. Meats and other proteins Nuts. Nut butters. Large portions of meat, poultry, or fish. Salted, precooked, or cured meats, such as sausages, meat loaves, and hot dogs. Dairy Cheese. Beverages Regular soft drinks. Regular vegetable juice. Seasonings and condiments Seasoning blends with salt. Salad dressings. Soy sauce. Ketchup. Barbecue sauce. Other foods Canned soups. Canned pasta sauce. Casseroles. Pizza. Lasagna. Frozen meals. Potato chips. French fries. The items listed above may not be a complete list of foods and beverages you should limit. Contact a dietitian for more information. What foods should I avoid? Talk to your dietitian about specific foods you should avoid based on the type of kidney stones you have and your overall health. Fruits Grapefruit. The item listed above may not be a complete list of foods and beverages you should  avoid. Contact a dietitian for more information. Summary Kidney stones are deposits of minerals and salts that form inside your kidneys. You can lower your risk of kidney stones by making changes to your diet. The most important thing you can do is drink enough fluid. Drink enough fluid to keep your urine pale yellow. Talk to your dietitian about how much calcium you should have each day, and eat less salt and animal protein as told by your dietitian. This information is not intended to replace advice given to you by your health care provider. Make sure you discuss any questions you have with your health care provider. Document Revised: 08/20/2019 Document Reviewed: 08/20/2019 Elsevier Patient Education  2022 [employer]vier Inc.  

## 2021-05-16 ENCOUNTER — Other Ambulatory Visit: Payer: Self-pay | Admitting: Family Medicine

## 2021-05-16 DIAGNOSIS — E039 Hypothyroidism, unspecified: Secondary | ICD-10-CM

## 2021-05-23 ENCOUNTER — Other Ambulatory Visit: Payer: Self-pay | Admitting: Urology

## 2021-06-01 ENCOUNTER — Ambulatory Visit (HOSPITAL_COMMUNITY)
Admission: RE | Admit: 2021-06-01 | Discharge: 2021-06-01 | Disposition: A | Discharge status: Home / Self Care | Payer: BC Managed Care – PPO | Source: Ambulatory Visit | Attending: Urology | Admitting: Urology

## 2021-06-01 ENCOUNTER — Other Ambulatory Visit: Payer: Self-pay

## 2021-06-01 DIAGNOSIS — K760 Fatty (change of) liver, not elsewhere classified: Secondary | ICD-10-CM | POA: Diagnosis not present

## 2021-06-01 DIAGNOSIS — N200 Calculus of kidney: Secondary | ICD-10-CM | POA: Diagnosis not present

## 2021-06-01 DIAGNOSIS — N3289 Other specified disorders of bladder: Secondary | ICD-10-CM | POA: Diagnosis not present

## 2021-06-01 IMAGING — US US RENAL
1 series · 14 of 25 positions shown · non-contrast
Comparison: Prior ultrasound from [DATE].

CLINICAL DATA: Follow-up examination for nephrolithiasis.

EXAM:
RENAL / URINARY TRACT ULTRASOUND COMPLETE

[Series 1: us renal · 14 of 65 slices shown]
[im 1/65]
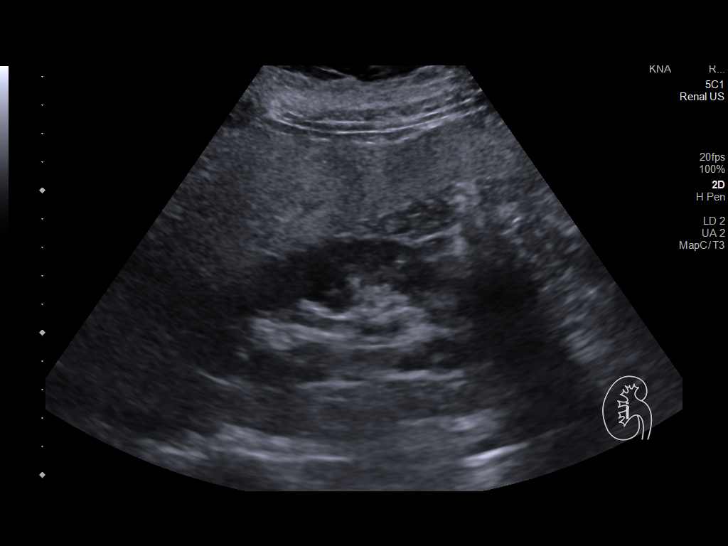
[im 6/65]
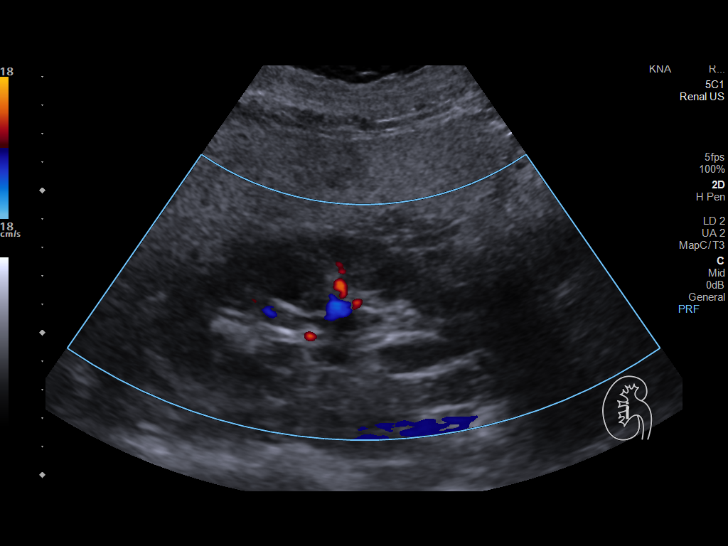
[im 11/65]
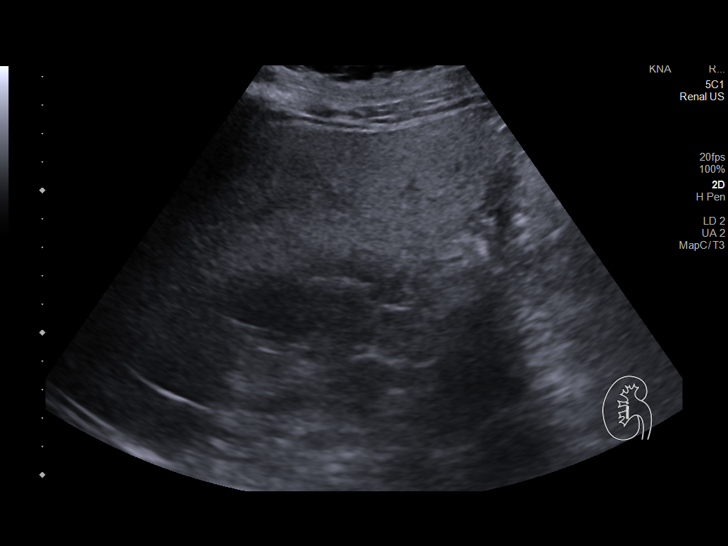
[im 17/65]
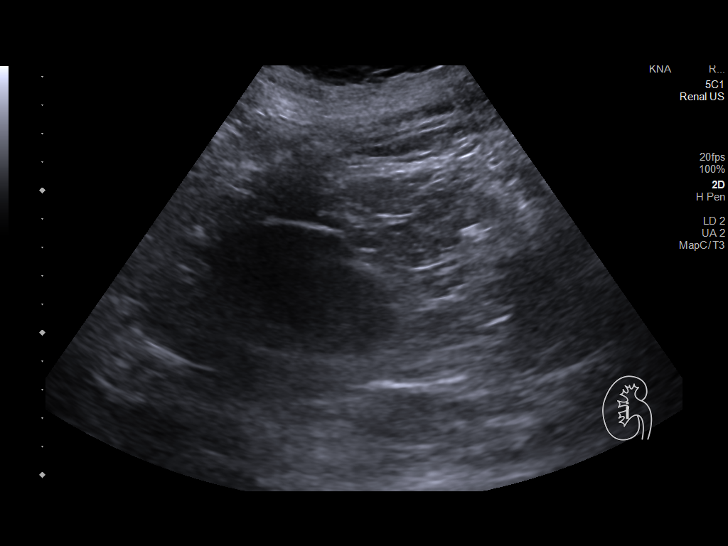
[im 22/65]
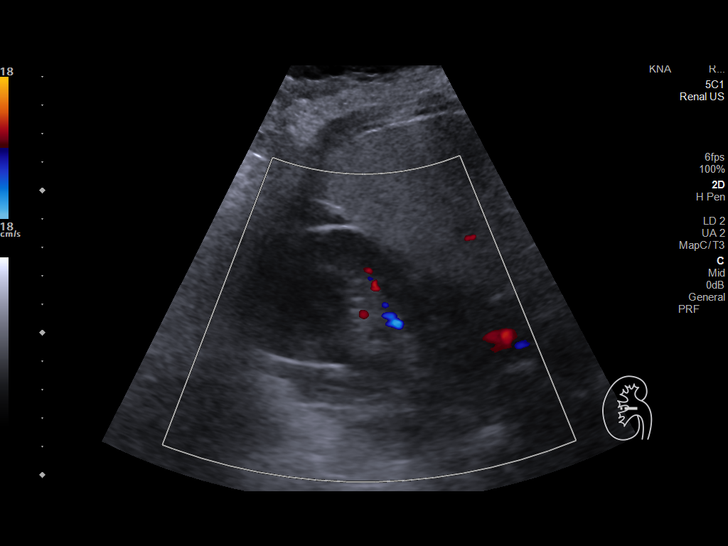
[im 25/65]
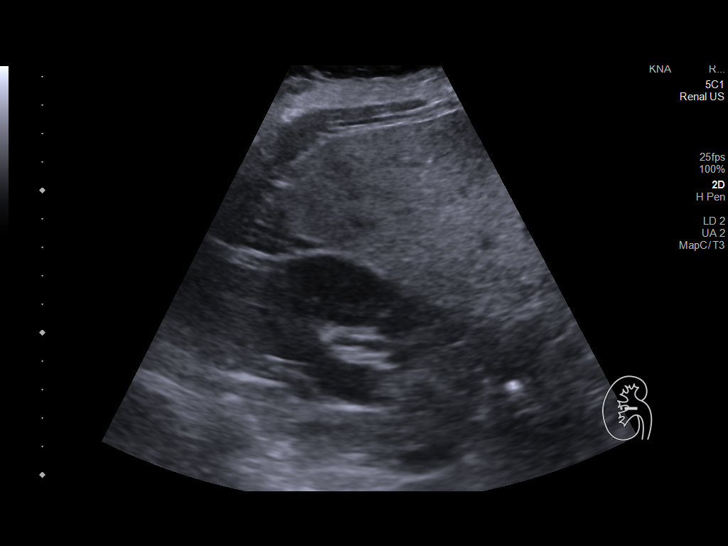
[im 30/65]
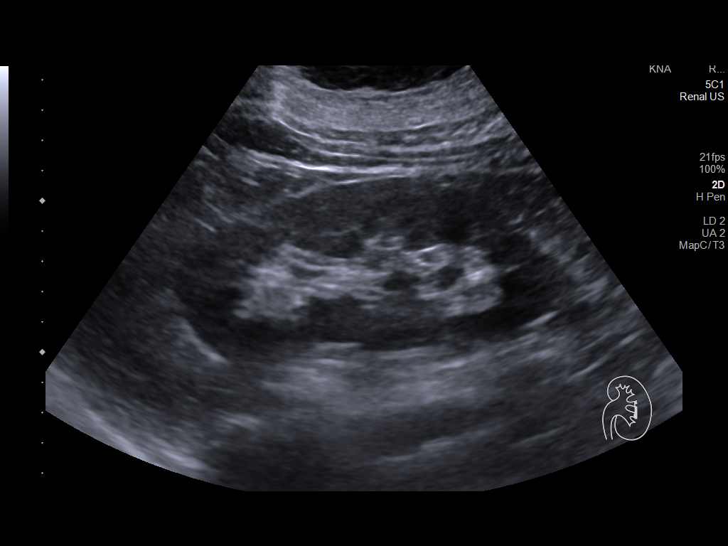
[im 35/65]
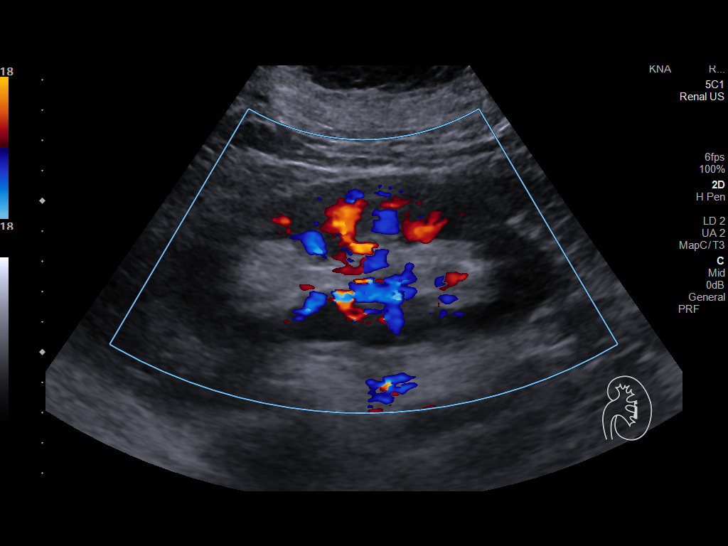
[im 41/65]
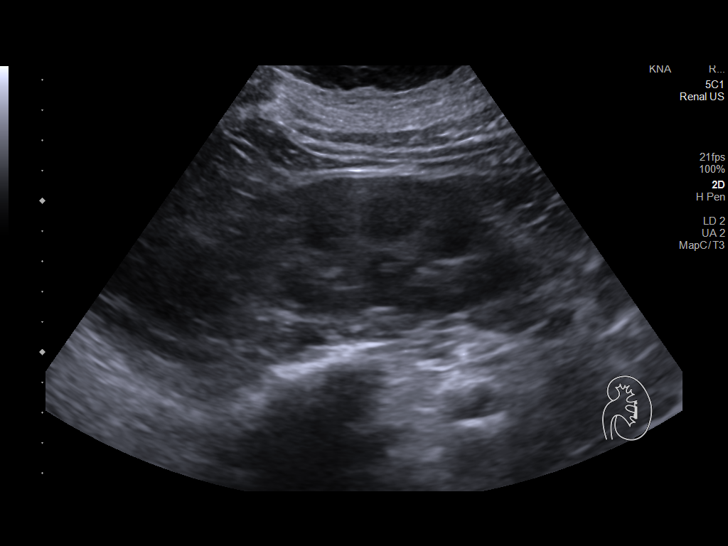
[im 43/65]
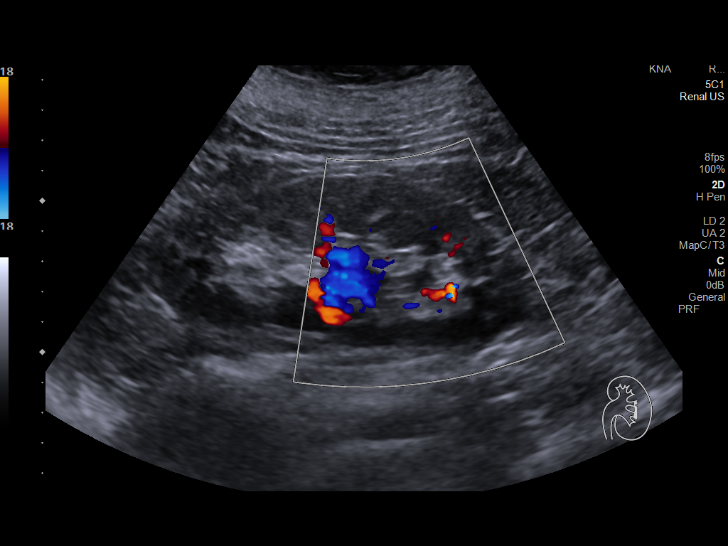
[im 49/65]
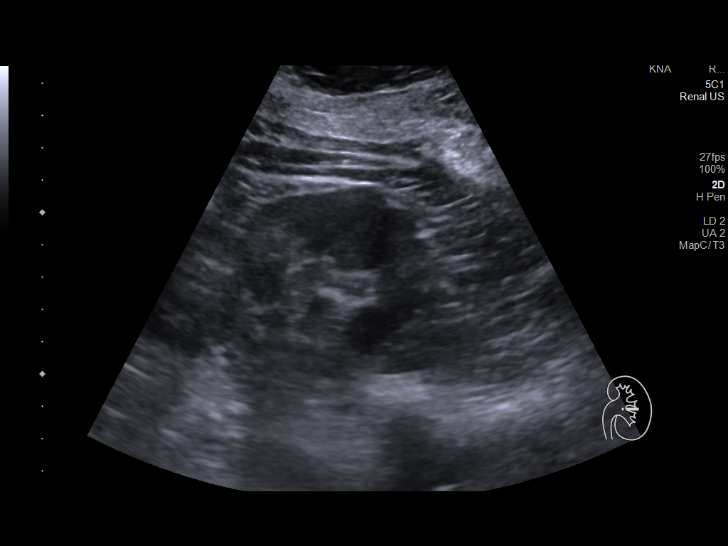
[im 54/65]
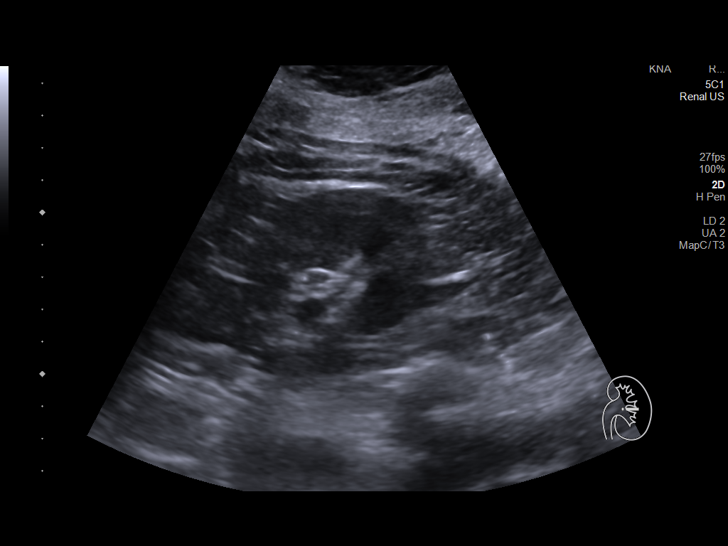
[im 59/65]
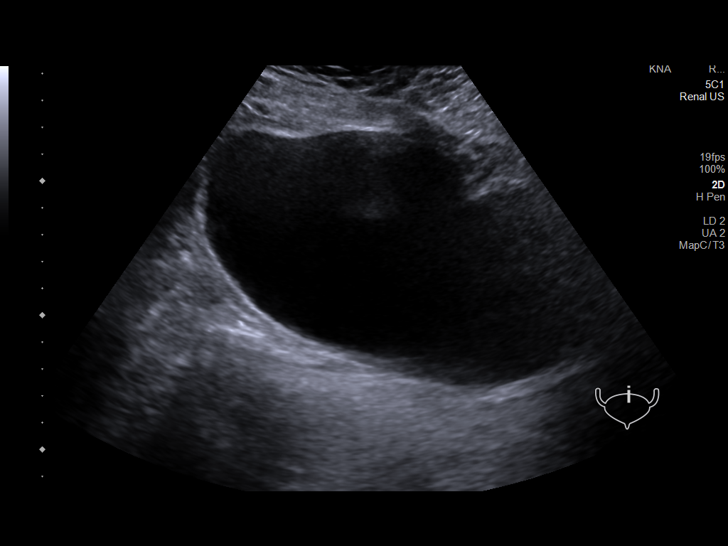
[im 65/65]
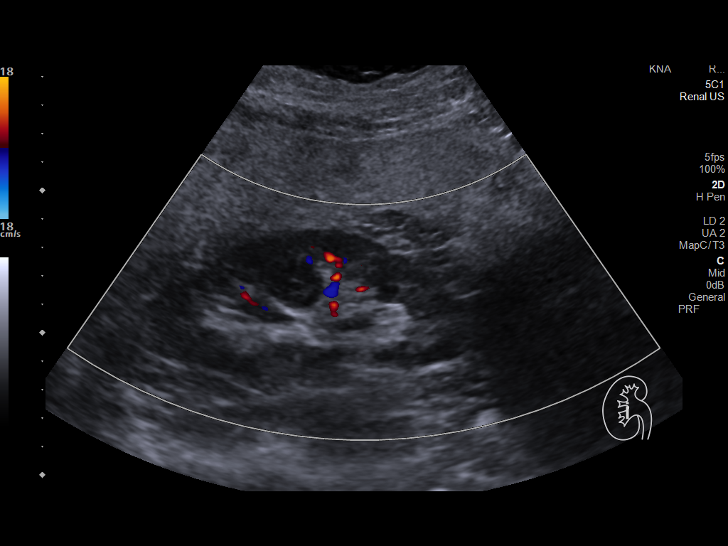

[14 of 25 positions shown; findings below may reference images not displayed]

FINDINGS: Right Kidney:

Renal measurements: 11.5 x 5.2 x 5.4 cm = volume: 168.8 mL. Renal
echogenicity within normal limits. Previously seen right-sided renal
calculi are not definitely visualized on this exam. No
hydronephrosis. No focal renal mass.

Left Kidney:

Renal measurements: 12.9 x 5.8 x 5.1 cm = volume: 202.5 mL. Renal
echogenicity within normal limits. 5 mm nonobstructive stone present
at the lower pole. No hydronephrosis seen on today's study. No focal
renal mass.

Bladder:

Bladder is moderately distended.  Neither ureteral jet visualized.

Other:

Note made of increased echogenicity within the hepatic parenchyma,
consistent with steatosis.
IMPRESSION: 1. 5 mm nonobstructive stone at the lower pole of the left kidney.
No other nephrolithiasis now seen.
2. No hydronephrosis.
3. Hepatic steatosis.

## 2021-06-03 ENCOUNTER — Other Ambulatory Visit: Payer: Self-pay | Admitting: Family Medicine

## 2021-06-03 DIAGNOSIS — E8881 Metabolic syndrome: Secondary | ICD-10-CM

## 2021-06-03 DIAGNOSIS — L680 Hirsutism: Secondary | ICD-10-CM

## 2021-06-05 ENCOUNTER — Encounter: Payer: Self-pay | Admitting: Urology

## 2021-06-05 ENCOUNTER — Other Ambulatory Visit: Payer: Self-pay

## 2021-06-05 ENCOUNTER — Ambulatory Visit (INDEPENDENT_AMBULATORY_CARE_PROVIDER_SITE_OTHER): Payer: BC Managed Care – PPO | Admitting: Urology

## 2021-06-05 VITALS — BP 133/75 | HR 76

## 2021-06-05 DIAGNOSIS — N200 Calculus of kidney: Secondary | ICD-10-CM | POA: Diagnosis not present

## 2021-06-05 MED ORDER — INDAPAMIDE 2.5 MG PO TABS
2.5000 mg | ORAL_TABLET | Freq: Every day | ORAL | 11 refills | Status: DC
Start: 2021-06-05 — End: 2022-05-29

## 2021-06-05 NOTE — Addendum Note (Signed)
Addended byIris Pert on: 06/05/2021 05:04 PM   Modules accepted: Orders

## 2021-06-05 NOTE — Patient Instructions (Signed)
Dietary Guidelines to Help Prevent Kidney Stones Kidney stones are deposits of minerals and salts that form inside your kidneys. Your risk of developing kidney stones may be greater depending on your diet, your lifestyle, the medicines you take, and whether you have certain medical conditions. Most people can lower their chances of developing kidney stones by following the instructions below. Your dietitian may give you more specific instructions depending on your overall health and the type of kidney stones you tend to develop. What are tips for following this plan? Reading food labels  Choose foods with "no salt added" or "low-salt" labels. Limit your salt (sodium) intake to less than 1,500 mg a day. Choose foods with calcium for each meal and snack. Try to eat about 300 mg of calcium at each meal. Foods that contain 200-500 mg of calcium a serving include: 8 oz (237 mL) of milk, calcium-fortifiednon-dairy milk, and calcium-fortifiedfruit juice. Calcium-fortified means that calcium has been added to these drinks. 8 oz (237 mL) of kefir, yogurt, and soy yogurt. 4 oz (114 g) of tofu. 1 oz (28 g) of cheese. 1 cup (150 g) of dried figs. 1 cup (91 g) of cooked broccoli. One 3 oz (85 g) can of sardines or mackerel. Most people need 1,000-1,500 mg of calcium a day. Talk to your dietitian about how much calcium is recommended for you. Shopping Buy plenty of fresh fruits and vegetables. Most people do not need to avoid fruits and vegetables, even if these foods contain nutrients that may contribute to kidney stones. When shopping for convenience foods, choose: Whole pieces of fruit. Pre-made salads with dressing on the side. Low-fat fruit and yogurt smoothies. Avoid buying frozen meals or prepared deli foods. These can be high in sodium. Look for foods with live cultures, such as yogurt and kefir. Choose high-fiber grains, such as whole-wheat breads, oat bran, and wheat cereals. Cooking Do not add  salt to food when cooking. Place a salt shaker on the table and allow each person to add his or her own salt to taste. Use vegetable protein, such as beans, textured vegetable protein (TVP), or tofu, instead of meat in pasta, casseroles, and soups. Meal planning Eat less salt, if told by your dietitian. To do this: Avoid eating processed or pre-made food. Avoid eating fast food. Eat less animal protein, including cheese, meat, poultry, or fish, if told by your dietitian. To do this: Limit the number of times you have meat, poultry, fish, or cheese each week. Eat a diet free of meat at least 2 days a week. Eat only one serving each day of meat, poultry, fish, or seafood. When you prepare animal protein, cut pieces into small portion sizes. For most meat and fish, one serving is about the size of the palm of your hand. Eat at least five servings of fresh fruits and vegetables each day. To do this: Keep fruits and vegetables on hand for snacks. Eat one piece of fruit or a handful of berries with breakfast. Have a salad and fruit at lunch. Have two kinds of vegetables at dinner. Limit foods that are high in a substance called oxalate. These include: Spinach (cooked), rhubarb, beets, sweet potatoes, and Swiss chard. Peanuts. Potato chips, french fries, and baked potatoes with skin on. Nuts and nut products. Chocolate. If you regularly take a diuretic medicine, make sure to eat at least 1 or 2 servings of fruits or vegetables that are high in potassium each day. These include: Avocado. Banana. Orange, prune,   carrot, or tomato juice. Baked potato. Cabbage. Beans and split peas. Lifestyle  Drink enough fluid to keep your urine pale yellow. This is the most important thing you can do. Spread your fluid intake throughout the day. If you drink alcohol: Limit how much you use to: 0-1 drink a day for women who are not pregnant. 0-2 drinks a day for men. Be aware of how much alcohol is in your  drink. In the [place] one drink equals one 12 oz bottle of beer (355 mL), one 5 oz glass of wine (148 mL), or one 1 oz glass of hard liquor (44 mL). Lose weight if told by your health care provider. Work with your dietitian to find an eating plan and weight loss strategies that work best for you. General information Talk to your health care provider and dietitian about taking daily supplements. You may be told the following depending on your health and the cause of your kidney stones: Not to take supplements with vitamin C. To take a calcium supplement. To take a daily probiotic supplement. To take other supplements such as magnesium, fish oil, or vitamin B6. Take over-the-counter and prescription medicines only as told by your health care provider. These include supplements. What foods should I limit? Limit your intake of the following foods, or eat them as told by your dietitian. Vegetables Spinach. Rhubarb. Beets. Canned vegetables. Pickles. Olives. Baked potatoes with skin. Grains Wheat bran. Baked goods. Salted crackers. Cereals high in sugar. Meats and other proteins Nuts. Nut butters. Large portions of meat, poultry, or fish. Salted, precooked, or cured meats, such as sausages, meat loaves, and hot dogs. Dairy Cheese. Beverages Regular soft drinks. Regular vegetable juice. Seasonings and condiments Seasoning blends with salt. Salad dressings. Soy sauce. Ketchup. Barbecue sauce. Other foods Canned soups. Canned pasta sauce. Casseroles. Pizza. Lasagna. Frozen meals. Potato chips. French fries. The items listed above may not be a complete list of foods and beverages you should limit. Contact a dietitian for more information. What foods should I avoid? Talk to your dietitian about specific foods you should avoid based on the type of kidney stones you have and your overall health. Fruits Grapefruit. The item listed above may not be a complete list of foods and beverages you should  avoid. Contact a dietitian for more information. Summary Kidney stones are deposits of minerals and salts that form inside your kidneys. You can lower your risk of kidney stones by making changes to your diet. The most important thing you can do is drink enough fluid. Drink enough fluid to keep your urine pale yellow. Talk to your dietitian about how much calcium you should have each day, and eat less salt and animal protein as told by your dietitian. This information is not intended to replace advice given to you by your health care provider. Make sure you discuss any questions you have with your health care provider. Document Revised: 08/20/2019 Document Reviewed: 08/20/2019 Elsevier Patient Education  2022 [employer]vier Inc.  

## 2021-06-05 NOTE — Progress Notes (Signed)
Urological Symptom Review  Patient is experiencing the following symptoms: Frequent urination Get up at night to urinate Leakage of urine (when I cough or sneeze) Kidney stones   Review of Systems  Gastrointestinal (upper)  : Nausea  Gastrointestinal (lower) : Negative for lower GI symptoms  Constitutional : Negative for symptoms  Skin: Negative for skin symptoms  Eyes: Negative for eye symptoms  Ear/Nose/Throat : Negative for Ear/Nose/Throat symptoms  Hematologic/Lymphatic: Negative for Hematologic/Lymphatic symptoms  Cardiovascular : Negative for cardiovascular symptoms  Respiratory : Negative for respiratory symptoms  Endocrine: Excessive thirst  Musculoskeletal: Joint pain  Neurological: Negative for neurological symptoms  Psychologic: Negative for psychiatric symptoms

## 2021-06-05 NOTE — Progress Notes (Signed)
06/05/2021 4:05 PM   Ann Peterson Seen [birth year] [id number]  Referring provider: Janora Norlander, DO [place],  [place] [address]  Followup nephrolithiasis   HPI: Ann Peterson is a 55yo here for followup for nephrolithiasis. She underwent renal US 9/22 showed a left renal calculus. 24 hour urine showed calcium 400, borderline elevated uric acid but she was on [employer] diet. NO flank pain. No other complaints today   PMH: Past Medical History:  Diagnosis Date   Cancer ([place]) 2000   thryoid papillary carcinoma   COVID-19 12/03/2019   body aches, joint pain, fever 104 x 4 days, then symptoms resolved   History of kidney stones    Hyperlipidemia    Hypertension    PONV (postoperative nausea and vomiting)    n/v after appendectomy 5-6 yrs ago   Sleep apnea    uses cpap   Thyroid disease    thyroidectomy due to cancer    Surgical History: Past Surgical History:  Procedure Laterality Date   ABDOMINAL HYSTERECTOMY  09/12/2016   partial   APPENDECTOMY  2011   CESAREAN SECTION     x 3   CHOLECYSTECTOMY  2000   laproscopic   CYSTOSCOPY W/ URETERAL STENT PLACEMENT N/A 12/03/2019   Procedure: CYSTOSCOPY WITH STENT PLACEMENT, RETROGRADE;  Surgeon: Cleon Gustin, MD;  Location: [facility];  Service: Urology;  Laterality: N/A;   CYSTOSCOPY WITH RETROGRADE PYELOGRAM, URETEROSCOPY AND STENT PLACEMENT Left 12/24/2019   Procedure: CYSTOSCOPY WITH RETROGRADE PYELOGRAM, URETEROSCOPY, STENT REMOVAL AND STONE EXTRACTION;  Surgeon: Cleon Gustin, MD;  Location: [facility] [place];  Service: Urology;  Laterality: Left;   CYSTOSCOPY WITH RETROGRADE PYELOGRAM, URETEROSCOPY AND STENT PLACEMENT Bilateral 05/04/2021   Procedure: CYSTOSCOPY WITH RETROGRADE PYELOGRAM, URETEROSCOPY AND STENT PLACEMENT;  Surgeon: Cleon Gustin, MD;  Location: [facility];  Service: Urology;  Laterality: Bilateral;   HOLMIUM LASER APPLICATION Bilateral 0/26/3785   Procedure: HOLMIUM LASER  APPLICATION;  Surgeon: Cleon Gustin, MD;  Location: [facility];  Service: Urology;  Laterality: Bilateral;   TOTAL THYROIDECTOMY  2000   sx and radiation done    Home Medications:  Allergies as of 06/05/2021       Reactions   Pollen Extract         Medication List        Accurate as of June 05, 2021  4:05 PM. If you have any questions, ask your nurse or doctor.          STOP taking these medications    oxyCODONE-acetaminophen 5-325 MG tablet Commonly known as: Percocet Stopped by: Nicolette Bang, MD   predniSONE 10 MG (21) Tbpk tablet Commonly known as: STERAPRED UNI-PAK 21 TAB Stopped by: Nicolette Bang, MD       TAKE these medications    albuterol 108 (90 Base) MCG/ACT inhaler Commonly known as: VENTOLIN HFA Inhale 2 puffs into the lungs every 6 (six) hours as needed for wheezing or shortness of breath.   amLODipine 10 MG tablet Commonly known as: NORVASC Take 1 tablet (10 mg total) by mouth daily.   BD Pen Needle Micro U/F 32G X 6 MM Misc Generic drug: Insulin Pen Needle Use to inject saxenda daily   BIOTIN PO Take 1 capsule by mouth every evening.   fluticasone 50 MCG/ACT nasal spray Commonly known as: FLONASE SPRAY 2 SPRAYS INTO EACH NOSTRIL EVERY DAY   levothyroxine 175 MCG tablet Commonly known as: SYNTHROID TAKE 1 TABLET BY MOUTH EVERY DAY  magnesium oxide 400 MG tablet Commonly known as: MAG-OX Take 400 mg by mouth daily.   meloxicam 15 MG tablet Commonly known as: MOBIC Take 15 mg by mouth daily.   metFORMIN 500 MG tablet Commonly known as: GLUCOPHAGE Take 1 tablet (500 mg total) by mouth daily with breakfast. (NEEDS TO BE SEEN BEFORE NEXT REFILL)   metoprolol succinate 100 MG 24 hr tablet Commonly known as: TOPROL-XL Take 1 tablet (100 mg total) by mouth daily. With or immediately following a meal   Saxenda 18 MG/3ML Sopn Generic drug: Liraglutide -Weight Management Inject 3 mg into the skin daily.    spironolactone 50 MG tablet Commonly known as: ALDACTONE Take 1 tablet (50 mg total) by mouth 2 (two) times daily. (NEEDS TO BE SEEN BEFORE NEXT REFILL)   traZODone 50 MG tablet Commonly known as: DESYREL TAKE 0.5-2 TABLETS (25-100 MG TOTAL) BY MOUTH AT BEDTIME AS NEEDED FOR SLEEP.   TURMERIC PO Take 1 capsule by mouth at bedtime.   Vitamin D 50 MCG (2000 UT) Caps Take 1 capsule by mouth every evening.        Allergies:  Allergies  Allergen Reactions   Pollen Extract     Family History: Family History  Problem Relation Age of Onset   Hypertension Mother    Dementia Mother    Macular degeneration Mother    Stroke Mother    COPD Father        lung cancer   Cancer Brother        colon cancer   Cancer Brother        brain tumor    Social History:  reports that she has never smoked. She has never used smokeless tobacco. She reports that she does not drink alcohol and does not use drugs.  ROS: All other review of systems were reviewed and are negative except what is noted above in HPI  Physical Exam: BP 133/75   Pulse 76   LMP 09/07/2016 (Exact Date)   Constitutional:  Alert and oriented, No acute distress. HEENT: [place] AT, moist mucus membranes.  Trachea midline, no masses. Cardiovascular: No clubbing, cyanosis, or edema. Respiratory: Normal respiratory effort, no increased work of breathing. GI: Abdomen is soft, nontender, nondistended, no abdominal masses GU: No CVA tenderness.  Lymph: No cervical or inguinal lymphadenopathy. Skin: No rashes, bruises or suspicious lesions. Neurologic: Grossly intact, no focal deficits, moving all 4 extremities. Psychiatric: Normal mood and affect.  Laboratory Data: Lab Results  Component Value Date   WBC 6.7 03/07/2021   HGB 13.4 03/07/2021   HCT 39.0 03/07/2021   MCV 91 03/07/2021   PLT 183 03/07/2021    Lab Results  Component Value Date   CREATININE 0.68 04/04/2021    No results found for: PSA  No results  found for: TESTOSTERONE  Lab Results  Component Value Date   HGBA1C 5.7 03/07/2021    Urinalysis    Component Value Date/Time   COLORURINE YELLOW 07/03/2018 1732   APPEARANCEUR Clear 05/10/2021 1429   LABSPEC 1.015 07/03/2018 1732   PHURINE 7.5 07/03/2018 1732   GLUCOSEU Negative 05/10/2021 1429   HGBUR SMALL (A) 07/03/2018 1732   BILIRUBINUR Negative 05/10/2021 1429   KETONESUR NEGATIVE 07/03/2018 1732   PROTEINUR Negative 05/10/2021 1429   PROTEINUR NEGATIVE 07/03/2018 1732   UROBILINOGEN 0.2 02/20/2014 0845   NITRITE Negative 05/10/2021 1429   NITRITE NEGATIVE 07/03/2018 1732   LEUKOCYTESUR Negative 05/10/2021 1429    Lab Results  Component Value Date  LABMICR See below: 05/10/2021   WBCUA 0-5 05/10/2021   LABEPIT >10 (A) 05/10/2021   MUCUS Present 04/04/2021   BACTERIA None seen 05/10/2021    Pertinent Imaging: Renal US 06/01/2021: Images reviewed and discussed with the patient  Results for orders placed in visit on 11/30/19  DG Abd 1 View  Narrative CLINICAL DATA:  LEFT flank pain radiating to groin, history of kidney stones  EXAM: ABDOMEN - 1 VIEW  COMPARISON:  10/05/2013  FINDINGS: Multiple tiny BILATERAL renal calculi up to 4 mm RIGHT and 3 mm LEFT.  In addition, 5 x 3 mm calculus at approximately the LEFT ureteropelvic junction, new.  No additional potential urinary tract calcifications.  Small LEFT pelvic phleboliths stable.  Levoconvex thoracolumbar scoliosis.  Bowel gas pattern normal.  IMPRESSION: Multiple BILATERAL renal calculi within additional 5 x 3 mm calculus projecting over the LEFT ureteropelvic junction region.   Electronically Signed By: Lavonia Dana M.D. On: 11/30/2019 16:57  No results found for this or any previous visit.  No results found for this or any previous visit.  No results found for this or any previous visit.  Results for orders placed during the hospital encounter of 06/01/21  Ultrasound renal  complete  Narrative CLINICAL DATA:  Follow-up examination for nephrolithiasis.  EXAM: RENAL / URINARY TRACT ULTRASOUND COMPLETE  COMPARISON:  Prior ultrasound from 02/17/2021.  FINDINGS: Right Kidney:  Renal measurements: 11.5 x 5.2 x 5.4 cm = volume: 168.8 mL. Renal echogenicity within normal limits. Previously seen right-sided renal calculi are not definitely visualized on this exam. No hydronephrosis. No focal renal mass.  Left Kidney:  Renal measurements: 12.9 x 5.8 x 5.1 cm = volume: 202.5 mL. Renal echogenicity within normal limits. 5 mm nonobstructive stone present at the lower pole. No hydronephrosis seen on today's study. No focal renal mass.  Bladder:  Bladder is moderately distended.  Neither ureteral jet visualized.  Other:  Note made of increased echogenicity within the hepatic parenchyma, consistent with steatosis.  IMPRESSION: 1. 5 mm nonobstructive stone at the lower pole of the left kidney. No other nephrolithiasis now seen. 2. No hydronephrosis. 3. Hepatic steatosis.   Electronically Signed By: Jeannine Boga M.D. On: 06/02/2021 04:10  No results found for this or any previous visit.  No results found for this or any previous visit.  Results for orders placed during the hospital encounter of 09/05/20  CT RENAL STONE STUDY  Narrative CLINICAL DATA:  Left-sided abdominal pain  EXAM: CT ABDOMEN AND PELVIS WITHOUT CONTRAST  TECHNIQUE: Multidetector CT imaging of the abdomen and pelvis was performed following the standard protocol without IV contrast.  COMPARISON:  12/03/2019  FINDINGS: Lower chest: No acute abnormality.  Hepatobiliary: Hepatic steatosis. Cholecystectomy. No biliary dilatation.  Pancreas: Unremarkable  Spleen: Unremarkable.  Adrenals/Urinary Tract: Adrenals are unremarkable. No hydronephrosis. Bilateral nonobstructing renal calculi measuring up to 4 mm. No ureteral calculi. Bladder is not well distended  but otherwise unremarkable.  Stomach/Bowel: Stomach is within normal limits. 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 Bowel is normal in caliber.  Vascular/Lymphatic: Aortic atherosclerosis. No enlarged lymph nodes.  Reproductive: Status post hysterectomy. No adnexal masses.  Other: No ascites.  Abdominal wall is unremarkable.  Musculoskeletal: Lumbar spine degenerative changes. No acute osseous abnormality.  IMPRESSION: No obstructing calculus or hydronephrosis. Bilateral nonobstructing renal calculi.  Hepatic steatosis.  Aortic Atherosclerosis (ICD10-I70.0).   Electronically Signed By: Macy Mis M.D. On: 09/05/2020 15:20   Assessment & Plan:    1. 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 -RTC 3 months with repeat 24 hour urine   No follow-ups on file.  Nicolette Bang, MD  [facility] [place]

## 2021-06-06 LAB — URINALYSIS, ROUTINE W REFLEX MICROSCOPIC
Bilirubin, UA: NEGATIVE
Glucose, UA: NEGATIVE
Ketones, UA: NEGATIVE
Leukocytes,UA: NEGATIVE
Nitrite, UA: NEGATIVE
Protein,UA: NEGATIVE
Specific Gravity, UA: 1.015 (ref 1.005–1.030)
Urobilinogen, Ur: 0.2 mg/dL (ref 0.2–1.0)
pH, UA: 6.5 (ref 5.0–7.5)

## 2021-06-06 LAB — MICROSCOPIC EXAMINATION: Renal Epithel, UA: NONE SEEN /hpf

## 2021-06-07 ENCOUNTER — Other Ambulatory Visit: Payer: Self-pay

## 2021-06-07 ENCOUNTER — Ambulatory Visit (INDEPENDENT_AMBULATORY_CARE_PROVIDER_SITE_OTHER): Payer: BC Managed Care – PPO | Admitting: Podiatry

## 2021-06-07 DIAGNOSIS — M722 Plantar fascial fibromatosis: Secondary | ICD-10-CM

## 2021-06-07 DIAGNOSIS — M7752 Other enthesopathy of left foot: Secondary | ICD-10-CM

## 2021-06-07 MED ORDER — KETOCONAZOLE 2 % EX CREA: 1.0000 "application " | TOPICAL_CREAM | Freq: Every day | CUTANEOUS | 0 refills | Status: DC

## 2021-06-07 NOTE — Patient Instructions (Signed)
For instructions on how to put on your Tri-Lock Ankle Brace, please visit [email or web address]    Plantar Fasciitis (Heel Spur Syndrome) with Rehab The plantar fascia is a fibrous, ligament-like, soft-tissue structure that spans the bottom of the foot. Plantar fasciitis is a condition that causes pain in the foot due to inflammation of the tissue. SYMPTOMS  Pain and tenderness on the underneath side of the foot. Pain that worsens with standing or walking. CAUSES  Plantar fasciitis is caused by irritation and injury to the plantar fascia on the underneath side of the foot. Common mechanisms of injury include: Direct trauma to bottom of the foot. Damage to a small nerve that runs under the foot where the main fascia attaches to the heel bone. Stress placed on the plantar fascia due to bone spurs. RISK INCREASES WITH:  Activities that place stress on the plantar fascia (running, jumping, pivoting, or cutting). Poor strength and flexibility. Improperly fitted shoes. Tight calf muscles. Flat feet. Failure to warm-up properly before activity. Obesity. PREVENTION Warm up and stretch properly before activity. Allow for adequate recovery between workouts. Maintain physical fitness: Strength, flexibility, and endurance. Cardiovascular fitness. Maintain a health body weight. Avoid stress on the plantar fascia. Wear properly fitted shoes, including arch supports for individuals who have flat feet.  PROGNOSIS  If treated properly, then the symptoms of plantar fasciitis usually resolve without surgery. However, occasionally surgery is necessary.  RELATED COMPLICATIONS  Recurrent symptoms that may result in a chronic condition. Problems of the lower back that are caused by compensating for the injury, such as limping. Pain or weakness of the foot during push-off following surgery. Chronic inflammation, scarring, and partial or complete fascia tear, occurring more often from repeated  injections.  TREATMENT  Treatment initially involves the use of ice and medication to help reduce pain and inflammation. The use of strengthening and stretching exercises may help reduce pain with activity, especially stretches of the Achilles tendon. These exercises may be performed at home or with a therapist. Your caregiver may recommend that you use heel cups of arch supports to help reduce stress on the plantar fascia. Occasionally, corticosteroid injections are given to reduce inflammation. If symptoms persist for greater than 6 months despite non-surgical (conservative), then surgery may be recommended.   MEDICATION  If pain medication is necessary, then nonsteroidal anti-inflammatory medications, such as aspirin and ibuprofen, or other minor pain relievers, such as acetaminophen, are often recommended. Do not take pain medication within 7 days before surgery. Prescription pain relievers may be given if deemed necessary by your caregiver. Use only as directed and only as much as you need. Corticosteroid injections may be given by your caregiver. These injections should be reserved for the most serious cases, because they may only be given a certain number of times.  HEAT AND COLD Cold treatment (icing) relieves pain and reduces inflammation. Cold treatment should be applied for 10 to 15 minutes every 2 to 3 hours for inflammation and pain and immediately after any activity that aggravates your symptoms. Use ice packs or massage the area with a piece of ice (ice massage). Heat treatment may be used prior to performing the stretching and strengthening activities prescribed by your caregiver, physical therapist, or athletic trainer. Use a heat pack or soak the injury in warm water.  SEEK IMMEDIATE MEDICAL CARE IF: Treatment seems to offer no benefit, or the condition worsens. Any medications produce adverse side effects.  EXERCISES- RANGE OF MOTION (ROM) AND STRETCHING EXERCISES - Plantar    CARE IF:  Treatment seems to offer no benefit, or the condition worsens.  Any medications produce adverse side effects.   EXERCISES- RANGE OF MOTION (ROM) AND STRETCHING EXERCISES - Plantar Fasciitis (Heel Spur Syndrome) These exercises may help you when beginning to rehabilitate your injury. Your symptoms may resolve with or without further involvement from your physician, physical therapist or athletic trainer. While completing these exercises, remember:   Restoring tissue flexibility helps normal motion to return to the joints. This allows healthier, less painful movement and activity.  An effective stretch should be held for at least 30 seconds.  A stretch should never be painful. You should only feel a gentle lengthening or release in the stretched tissue.  RANGE OF MOTION - Toe Extension, Flexion  Sit with your right / left leg crossed over your opposite knee.  Grasp your toes and gently pull them back toward the top of your foot. You should feel a stretch on the bottom of your toes and/or foot.  Hold this stretch for 10 seconds.  Now, gently pull your toes toward the bottom of your foot. You should feel a stretch on the top of your toes and or foot.  Hold this stretch for 10 seconds. Repeat  times. Complete this stretch 3 times per day.   RANGE OF MOTION - Ankle Dorsiflexion, Active Assisted  Remove shoes and sit on a chair that is preferably not on a carpeted surface.  Place right / left foot under knee. Extend your opposite leg for support.  Keeping your heel down, slide your right / left foot back toward the chair until you feel a stretch at your ankle or calf. If you do not feel a stretch, slide your bottom forward to the edge of the chair, while still keeping your heel down.  Hold this stretch for 10 seconds. Repeat 3 times. Complete this stretch 2 times per day.   STRETCH  Gastroc, Standing  Place hands on wall.  Extend right / left leg, keeping the front knee somewhat bent.  Slightly point your toes inward on your back foot.  Keeping your right / left heel on the floor and your  knee straight, shift your weight toward the wall, not allowing your back to arch.  You should feel a gentle stretch in the right / left calf. Hold this position for 10 seconds. Repeat 3 times. Complete this stretch 2 times per day.  STRETCH  Soleus, Standing  Place hands on wall.  Extend right / left leg, keeping the other knee somewhat bent.  Slightly point your toes inward on your back foot.  Keep your right / left heel on the floor, bend your back knee, and slightly shift your weight over the back leg so that you feel a gentle stretch deep in your back calf.  Hold this position for 10 seconds. Repeat 3 times. Complete this stretch 2 times per day.  STRETCH  Gastrocsoleus, Standing  Note: This exercise can place a lot of stress on your foot and ankle. Please complete this exercise only if specifically instructed by your caregiver.   Place the ball of your right / left foot on a step, keeping your other foot firmly on the same step.  Hold on to the wall or a rail for balance.  Slowly lift your other foot, allowing your body weight to press your heel down over the edge of the step.  You should feel a stretch in your right / left calf.  Hold this   position for 10 seconds.  Repeat this exercise with a slight bend in your right / left knee. Repeat 3 times. Complete this stretch 2 times per day.   STRENGTHENING EXERCISES - Plantar Fasciitis (Heel Spur Syndrome)  These exercises may help you when beginning to rehabilitate your injury. They may resolve your symptoms with or without further involvement from your physician, physical therapist or athletic trainer. While completing these exercises, remember:   Muscles can gain both the endurance and the strength needed for everyday activities through controlled exercises.  Complete these exercises as instructed by your physician, physical therapist or athletic trainer. Progress the resistance and repetitions only as guided.  STRENGTH -  Towel Curls  Sit in a chair positioned on a non-carpeted surface.  Place your foot on a towel, keeping your heel on the floor.  Pull the towel toward your heel by only curling your toes. Keep your heel on the floor. Repeat 3 times. Complete this exercise 2 times per day.  STRENGTH - Ankle Inversion  Secure one end of a rubber exercise band/tubing to a fixed object (table, pole). Loop the other end around your foot just before your toes.  Place your fists between your knees. This will focus your strengthening at your ankle.  Slowly, pull your big toe up and in, making sure the band/tubing is positioned to resist the entire motion.  Hold this position for 10 seconds.  Have your muscles resist the band/tubing as it slowly pulls your foot back to the starting position. Repeat 3 times. Complete this exercises 2 times per day.  Document Released: 08/27/2005 Document Revised: 11/19/2011 Document Reviewed: 12/09/2008 [employer] Patient Information 2014 [employer]. 

## 2021-06-09 NOTE — Progress Notes (Signed)
Subjective: 55 year old female presents the office today for concerns of reoccurrence of left heel pain, plantar fasciitis.  She states that she was feeling good and not having significant pain so she decided to go for a walk on the track and afterwards she developed reoccurrence of the pain to the heel and is causing her to walk differently and she started get pain to the outside aspect of her foot.  No specific injury that she can recall.  No numbness or tingling.  No other concerns.   Objective: AAO x3, NAD DP/PT pulses palpable bilaterally, CRT less than 3 seconds There is tenderness palpation along plantar medial tubercle of the calcaneus at the insertion of plantar fascia the left foot.  There is no pain with lateral compression of calcaneus.  There is no edema, erythema.  Mild discomfort in the course of the peroneal tendon but clinically the tendon appears to be intact.  No edema to the ankle.  MMT 5/5.  Negative Tinel sign. No pain with calf compression, swelling, warmth, erythema  Assessment: 55 year old female with chronic left foot pain, plantar fasciitis with tendinitis as a result of compensation  Plan: -All treatment options discussed with the patient including all alternatives, risks, complications.  -Steroid injection performed.  See procedure note below.  Dispensed Tri-Lock ankle brace given the tendinitis.  Plantar fascial strapping was applied today.  Continue stretching, icing daily.  If symptoms continue MRI. -Patient encouraged to call the office with any questions, concerns, change in symptoms.   Procedure: Injection Tendon/Ligament Discussed alternatives, risks, complications and verbal consent was obtained.  Location: Left plantar fascia at the glabrous junction; medial approach. Skin Prep: Alcohol. Injectate: 0.5cc 0.5% marcaine plain, 0.5 cc 2% lidocaine plain and, 1 cc kenalog 10. Disposition: Patient tolerated procedure well. Injection site dressed with a  band-aid.  Post-injection care was discussed and return precautions discussed.   No follow-ups on file.  Trula Slade DPM

## 2021-06-26 ENCOUNTER — Other Ambulatory Visit: Payer: Self-pay | Admitting: Sports Medicine

## 2021-06-26 MED ORDER — PREDNISONE 10 MG (21) PO TBPK: ORAL_TABLET | ORAL | 0 refills | Status: DC

## 2021-06-26 NOTE — Progress Notes (Signed)
Rx Prednisone dose pak for heel pain

## 2021-06-28 ENCOUNTER — Other Ambulatory Visit: Payer: BC Managed Care – PPO

## 2021-06-28 ENCOUNTER — Telehealth: Payer: Self-pay

## 2021-06-28 ENCOUNTER — Other Ambulatory Visit: Payer: Self-pay | Admitting: Family Medicine

## 2021-06-28 ENCOUNTER — Other Ambulatory Visit: Payer: Self-pay

## 2021-06-28 DIAGNOSIS — L680 Hirsutism: Secondary | ICD-10-CM

## 2021-06-28 DIAGNOSIS — E8881 Metabolic syndrome: Secondary | ICD-10-CM

## 2021-06-28 DIAGNOSIS — N390 Urinary tract infection, site not specified: Secondary | ICD-10-CM

## 2021-06-28 DIAGNOSIS — N200 Calculus of kidney: Secondary | ICD-10-CM

## 2021-06-28 MED ORDER — NITROFURANTOIN MONOHYD MACRO 100 MG PO CAPS: 100.0000 mg | ORAL_CAPSULE | Freq: Two times a day (BID) | ORAL | 0 refills | Status: DC

## 2021-06-28 NOTE — Addendum Note (Signed)
Addended by: Dorisann Frames on: 06/28/2021 04:30 PM   Modules accepted: Orders

## 2021-06-28 NOTE — Telephone Encounter (Signed)
Patient called office today after stating prednisone for her foot her urine turned cloudy.   Reviewed with Dr. Alyson Ingles and ua and uc ordered.

## 2021-06-28 NOTE — Telephone Encounter (Signed)
Reviewed UA with Dr. Alyson Ingles,  Macrobid 100mg  BID x 7 days per Dr. Alyson Ingles. Patient notified of prescription sent to pharmacy. Voiced  understanding.

## 2021-06-28 NOTE — Telephone Encounter (Signed)
Gottsch[place]. NTBS 30 days given 06/05/21

## 2021-06-28 NOTE — Addendum Note (Signed)
Addended by: Dorisann Frames on: 06/28/2021 04:37 PM   Modules accepted: Orders

## 2021-06-29 ENCOUNTER — Other Ambulatory Visit: Payer: Self-pay | Admitting: Family Medicine

## 2021-06-29 DIAGNOSIS — J302 Other seasonal allergic rhinitis: Secondary | ICD-10-CM

## 2021-06-29 LAB — URINALYSIS, ROUTINE W REFLEX MICROSCOPIC
Bilirubin, UA: NEGATIVE
Glucose, UA: NEGATIVE
Ketones, UA: NEGATIVE
Leukocytes,UA: NEGATIVE
Nitrite, UA: NEGATIVE
Specific Gravity, UA: 1.02 (ref 1.005–1.030)
Urobilinogen, Ur: 0.2 mg/dL (ref 0.2–1.0)
pH, UA: 7.5 (ref 5.0–7.5)

## 2021-06-29 LAB — MICROSCOPIC EXAMINATION
RBC, Urine: NONE SEEN /hpf (ref 0–2)
Renal Epithel, UA: NONE SEEN /hpf
WBC, UA: NONE SEEN /hpf (ref 0–5)

## 2021-06-30 LAB — URINE CULTURE

## 2021-07-05 NOTE — Progress Notes (Signed)
Sent via mychart

## 2021-07-06 ENCOUNTER — Other Ambulatory Visit: Payer: Self-pay

## 2021-07-06 ENCOUNTER — Ambulatory Visit (INDEPENDENT_AMBULATORY_CARE_PROVIDER_SITE_OTHER): Payer: BC Managed Care – PPO

## 2021-07-06 ENCOUNTER — Ambulatory Visit (INDEPENDENT_AMBULATORY_CARE_PROVIDER_SITE_OTHER): Payer: BC Managed Care – PPO | Admitting: Podiatry

## 2021-07-06 DIAGNOSIS — M7752 Other enthesopathy of left foot: Secondary | ICD-10-CM

## 2021-07-06 DIAGNOSIS — M722 Plantar fascial fibromatosis: Secondary | ICD-10-CM | POA: Diagnosis not present

## 2021-07-09 NOTE — Progress Notes (Signed)
Subjective: 55 year old female presents the office today for concerns of continued pain to her left heel.  She states that she has been using a Which is been helping in the morning some.  She still stretching, icing.  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  She has had previous injections which have been helpful for short time.  The last injection was not helpful.  No recent injury.  No numbness or tingling  Objective: AAO x3, NAD DP/PT pulses palpable bilaterally, CRT less than 3 seconds There is continuation of tenderness on the plantar medial tubercle of the calcaneus at the insertion of plantar fascia on the left side.  No pain with lateral compression of calcaneus.  No pain Achilles tendon.  She is getting some discomfort on the course of the flexor tendons but clinically the tendons appear to be intact.  MMT 5/5.  No edema.  No open lesions or pre-ulcerative lesions.  No pain with calf compression, swelling, warmth, erythema  Assessment: 55 year old female with chronic left heel pain, likely plantar fasciitis, rule out tear  Plan: -All treatment options discussed with the patient including all alternatives, risks, complications.  -X-rays obtained reviewed.  Calcaneal spurs present.  No subacute fracture. -This point given her continuation of symptoms I will order MRI.  Also since her symptoms worsen after she is on her feet all day she stands up and your nurse.  Dispensed a cam boot.  Continue ice elevation as well as stretching, icing daily. -Patient encouraged to call the office with any questions, concerns, change in symptoms.   Trula Slade DPM

## 2021-07-19 ENCOUNTER — Telehealth: Payer: Self-pay | Admitting: *Deleted

## 2021-07-19 NOTE — Telephone Encounter (Signed)
Called and spoke with [facility] imaging and they stated that they tried to call the patient on 07-11-2021 and 07-18-2021 and the voice mail was full and could not leave a message. Lattie Haw

## 2021-07-19 NOTE — Telephone Encounter (Signed)
-----   Message from Trula Slade, DPM sent at 07/09/2021  3:22 PM EDT ----- MRI ordered for the left ankle if someone could please follow-up on this.  Thank you.

## 2021-07-31 ENCOUNTER — Other Ambulatory Visit: Payer: Self-pay | Admitting: Sports Medicine

## 2021-07-31 MED ORDER — PREDNISONE 10 MG (21) PO TBPK: ORAL_TABLET | ORAL | 0 refills | Status: DC

## 2021-07-31 MED ORDER — AZITHROMYCIN 250 MG PO TABS: ORAL_TABLET | ORAL | 0 refills | Status: AC

## 2021-07-31 NOTE — Progress Notes (Signed)
Rx sent 

## 2021-08-15 ENCOUNTER — Other Ambulatory Visit: Payer: Self-pay | Admitting: Family Medicine

## 2021-08-15 DIAGNOSIS — I10 Essential (primary) hypertension: Secondary | ICD-10-CM

## 2021-08-31 ENCOUNTER — Other Ambulatory Visit: Payer: Self-pay

## 2021-08-31 ENCOUNTER — Ambulatory Visit
Admission: RE | Admit: 2021-08-31 | Discharge: 2021-08-31 | Disposition: A | Discharge status: Home / Self Care | Payer: BC Managed Care – PPO | Source: Ambulatory Visit | Attending: Podiatry | Admitting: Podiatry

## 2021-08-31 DIAGNOSIS — M722 Plantar fascial fibromatosis: Secondary | ICD-10-CM

## 2021-08-31 DIAGNOSIS — S86312A Strain of muscle(s) and tendon(s) of peroneal muscle group at lower leg level, left leg, initial encounter: Secondary | ICD-10-CM | POA: Diagnosis not present

## 2021-08-31 DIAGNOSIS — M7752 Other enthesopathy of left foot: Secondary | ICD-10-CM

## 2021-08-31 IMAGING — MR MR ANKLE*L* W/O CM
4 of 5 series · 13 of 40 positions shown · non-contrast
Comparison: X ray ankle [DATE].

CLINICAL DATA: Left ankle/heel pain for 6 months. Clinical concern
for tendon abnormality.

EXAM:
MRI OF THE LEFT ANKLE WITHOUT CONTRAST
TECHNIQUE: Multiplanar, multisequence MR imaging of the ankle was performed. No
intravenous contrast was administered.

[Series 3: PD fat-sat · axial · left · 3.0mm · 0.25mm/px · z∈[-101,-1]mm · 4 of 30 slices shown]
[im 1/30]
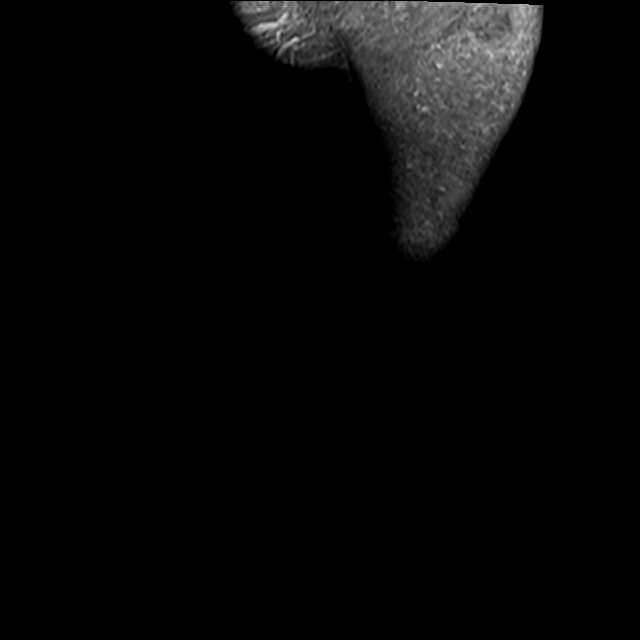
[im 4/30]
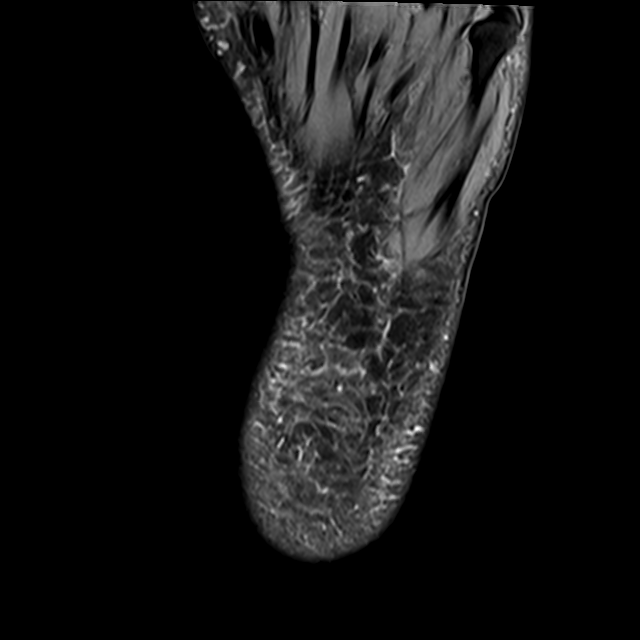
[im 15/30]
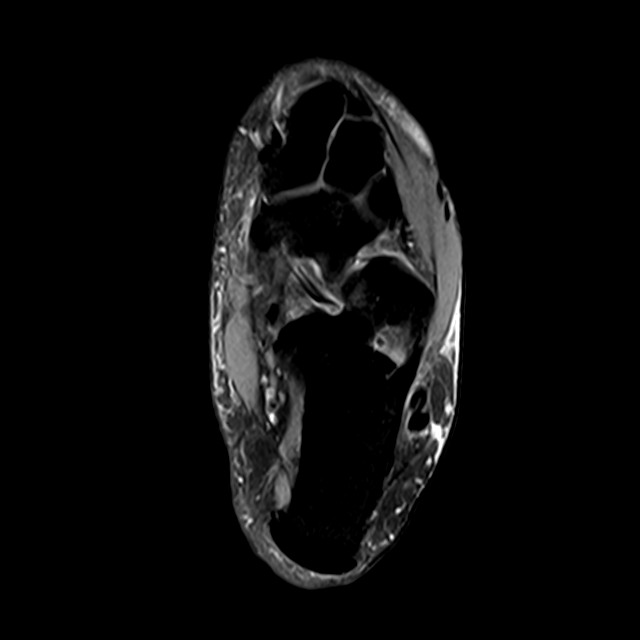
[im 26/30]
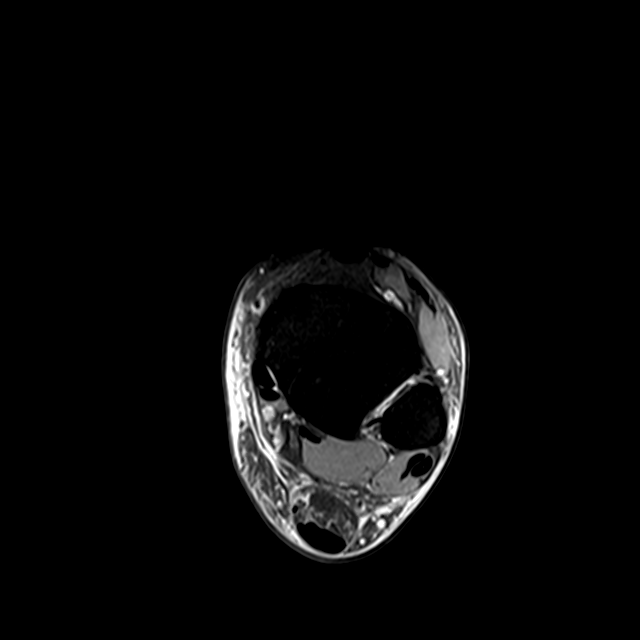

[Series 4: T2 fat-sat · axial · left · 3.0mm · 0.25mm/px · z∈[-85,-5]mm · 3 of 30 slices shown (1 of 2)]
[im 5/30]
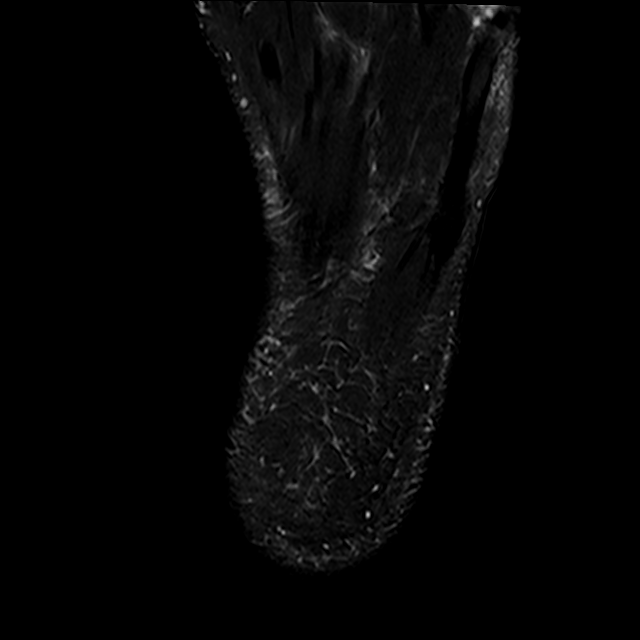
[im 17/30]
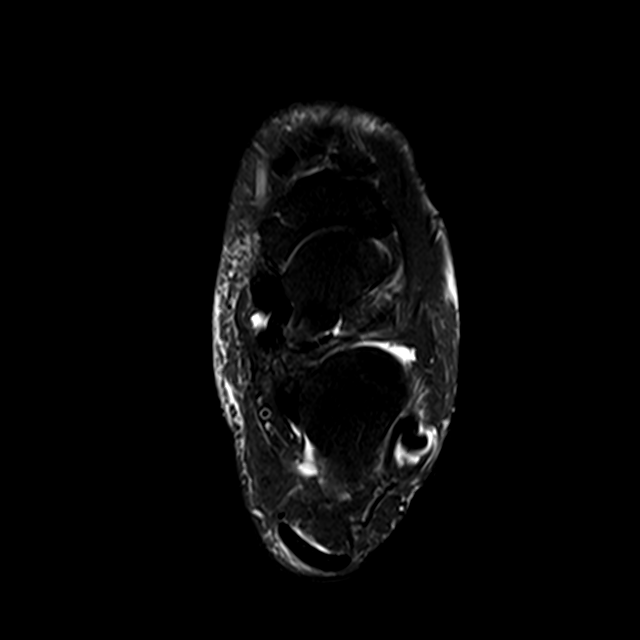
[im 25/30]
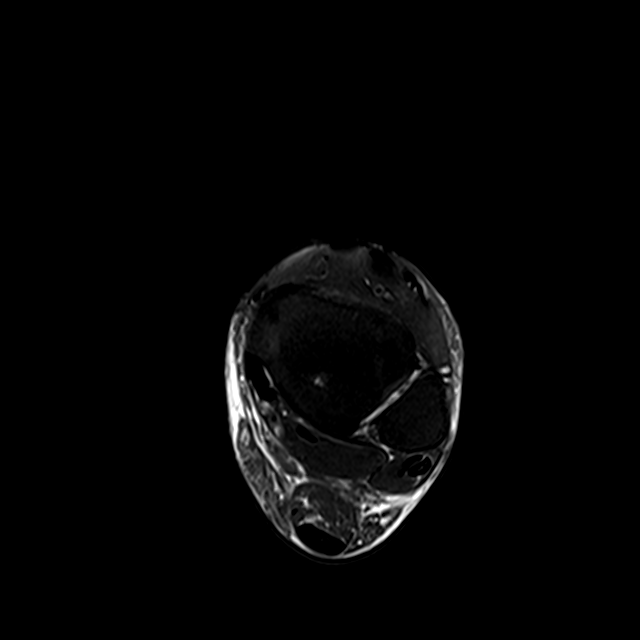

[Series 5: T1 · sagittal · left · 4.0mm · 0.28mm/px · 3 of 24 slices shown]
[im 4/24]
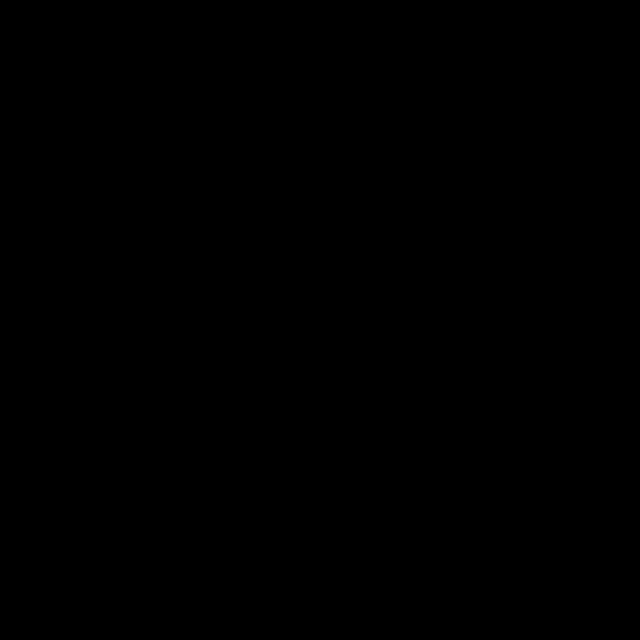
[im 12/24]
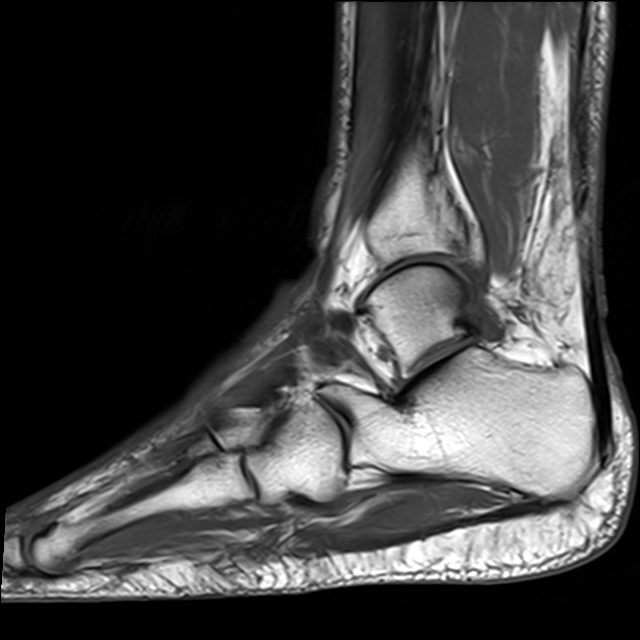
[im 20/24]
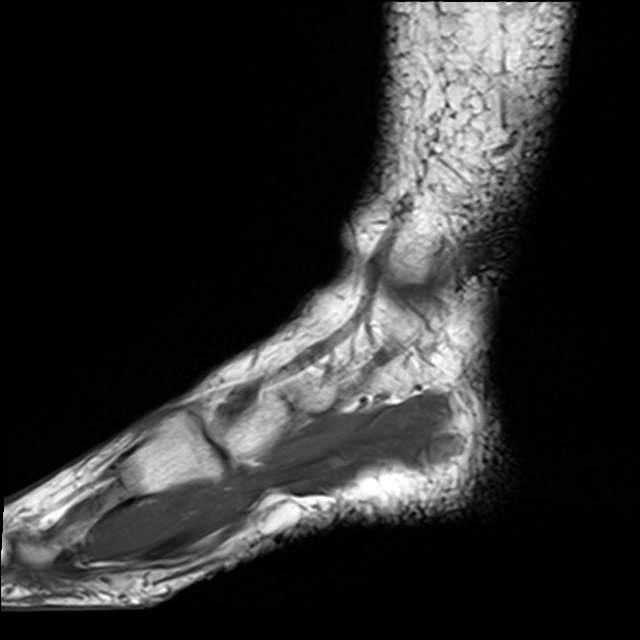

[Series 7: T2 fat-sat · coronal · left · 3.0mm · 0.25mm/px · 3 of 37 slices shown (2 of 2)]
[im 5/37]
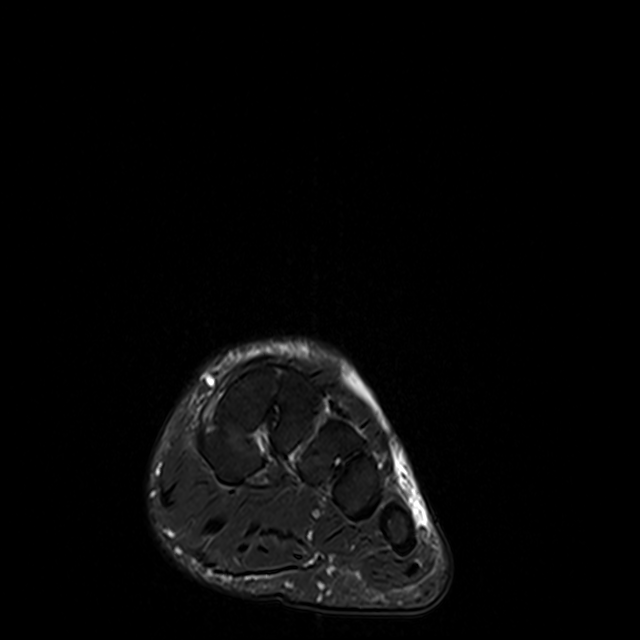
[im 21/37]
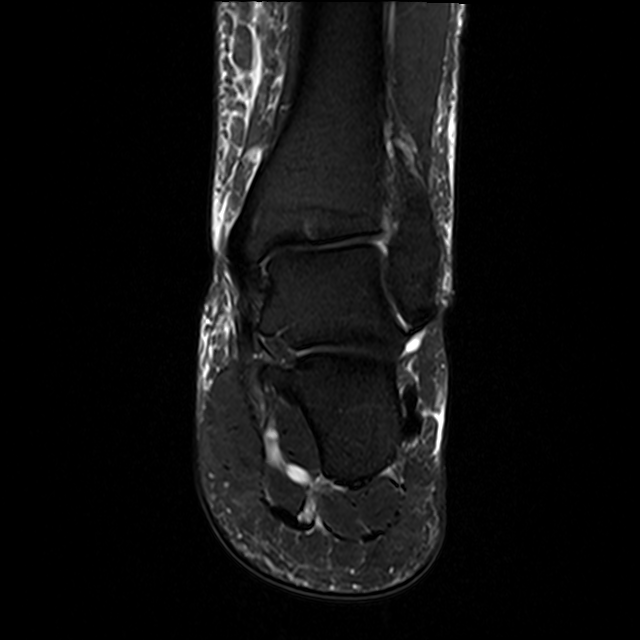
[im 33/37]
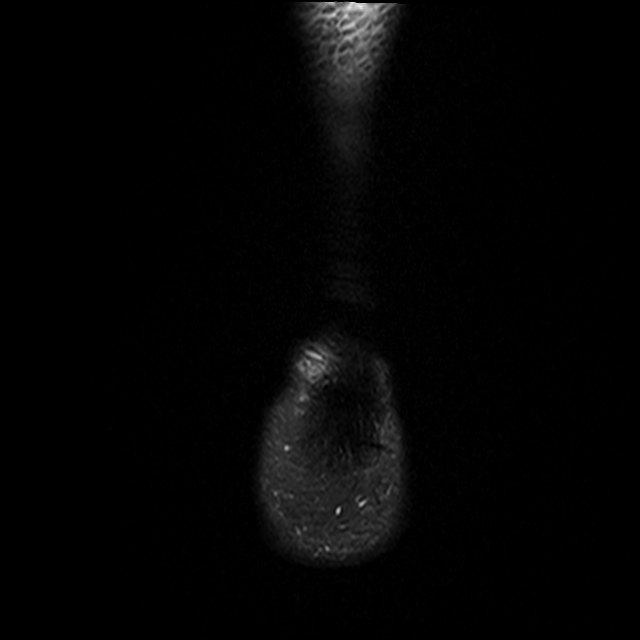

[13 of 40 positions shown; findings below may reference images not displayed]

FINDINGS: TENDONS

Peroneal: Mild peroneus longus tendinosis without tear. Peroneus
longus and brevis tendons are intact and normally positioned.

Posteromedial: Tibialis posterior, flexor hallucis longus, and
flexor digitorum longus tendons are intact and normally positioned.

Anterior: Tibialis anterior, extensor hallucis longus, and extensor
digitorum longus tendons are intact and normally positioned.

Achilles: Intact.

Plantar Fascia: Thickening of the central band of the plantar fascia
with partial-thickness tear proximally (series 7, image 12). No
full-thickness tear/disruption.

LIGAMENTS

Lateral: Intact tibiofibular ligaments. The anterior and posterior
talofibular ligaments are intact. Intact calcaneofibular ligament.

Medial: The deltoid and visualized portions of the spring ligament
appear intact.

CARTILAGE AND BONES

Ankle Joint: No significant ankle joint effusion. The talar dome and
tibial plafond are intact.

Subtalar Joints/Sinus Tarsi: No cartilage defect. No effusion.
Preservation of the anatomic fat within the sinus tarsi.

Bones: No acute fracture. No malalignment. Small bidirectional
calcaneal enthesophytes. No marrow signal abnormality. No suspicious
bone lesion.

Other: Mild soft tissue edema.  No fluid collection.
IMPRESSION: 1. Plantar fasciitis with partial-thickness tear proximally.
2. Mild peroneus longus tendinosis without tear.

## 2021-09-01 ENCOUNTER — Other Ambulatory Visit: Payer: Self-pay | Admitting: Family Medicine

## 2021-09-01 DIAGNOSIS — L680 Hirsutism: Secondary | ICD-10-CM

## 2021-09-08 ENCOUNTER — Ambulatory Visit: Payer: BC Managed Care – PPO | Admitting: Urology

## 2021-10-20 ENCOUNTER — Ambulatory Visit: Payer: BC Managed Care – PPO | Admitting: Urology

## 2021-10-30 ENCOUNTER — Other Ambulatory Visit (HOSPITAL_COMMUNITY): Payer: Self-pay | Admitting: Student

## 2021-10-30 ENCOUNTER — Other Ambulatory Visit: Payer: Self-pay

## 2021-10-30 ENCOUNTER — Other Ambulatory Visit: Payer: Self-pay | Admitting: Family Medicine

## 2021-10-30 ENCOUNTER — Ambulatory Visit (HOSPITAL_COMMUNITY)
Admission: RE | Admit: 2021-10-30 | Discharge: 2021-10-30 | Disposition: A | Discharge status: Home / Self Care | Payer: BC Managed Care – PPO | Source: Ambulatory Visit | Attending: Student | Admitting: Student

## 2021-10-30 DIAGNOSIS — L680 Hirsutism: Secondary | ICD-10-CM

## 2021-10-30 DIAGNOSIS — M5416 Radiculopathy, lumbar region: Secondary | ICD-10-CM | POA: Diagnosis not present

## 2021-10-30 DIAGNOSIS — M545 Low back pain, unspecified: Secondary | ICD-10-CM | POA: Diagnosis not present

## 2021-10-30 DIAGNOSIS — M5136 Other intervertebral disc degeneration, lumbar region: Secondary | ICD-10-CM | POA: Diagnosis not present

## 2021-10-30 IMAGING — MR MR LUMBAR SPINE W/O CM
5 series · 30 of 48 positions shown · non-contrast
Comparison: Lumbar spine radiographs [DATE]

CLINICAL DATA: Low back pain radiating to the right leg for 2 weeks

EXAM:
MRI LUMBAR SPINE WITHOUT CONTRAST
TECHNIQUE: Multiplanar, multisequence MR imaging of the lumbar spine was
performed. No intravenous contrast was administered.

[Series 5: T2 · sagittal · 4.0mm · 0.68mm/px · 6 of 16 slices shown (1 of 2)]
[im 1/16]
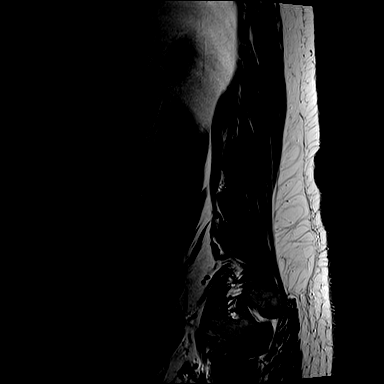
[im 4/16]
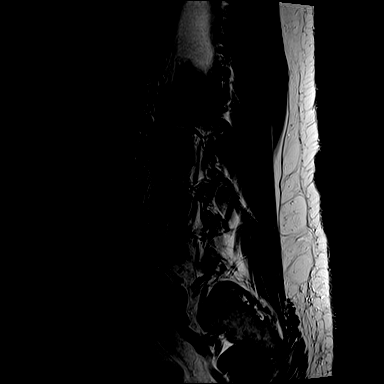
[im 7/16]
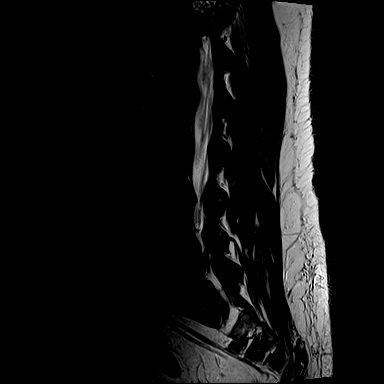
[im 10/16]
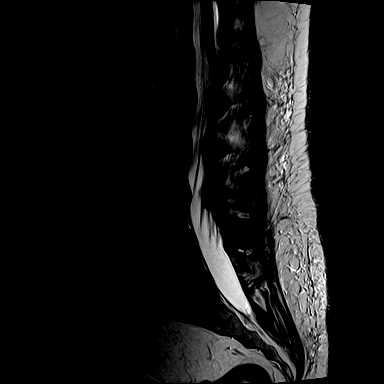
[im 13/16]
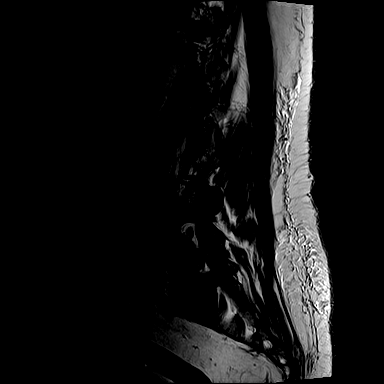
[im 16/16]
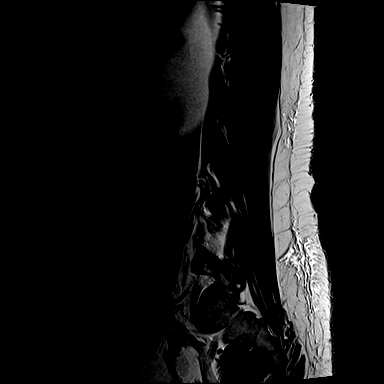

[Series 6: T1 · sagittal · 4.0mm · 0.81mm/px · 7 of 16 slices shown (1 of 2)]
[im 1/16]
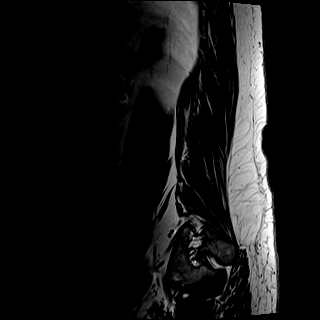
[im 3/16]
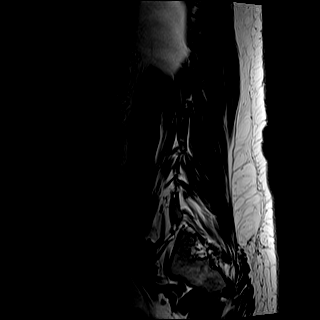
[im 6/16]
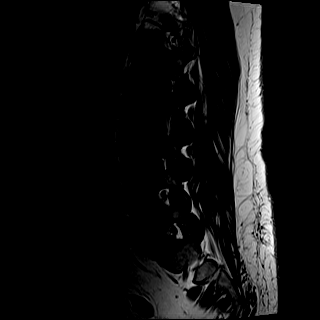
[im 8/16]
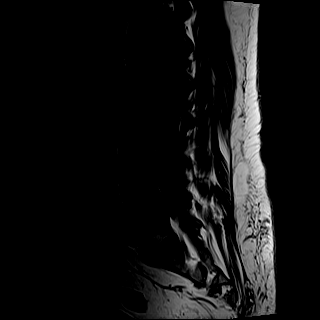
[im 11/16]
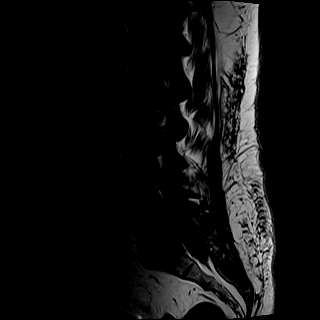
[im 13/16]
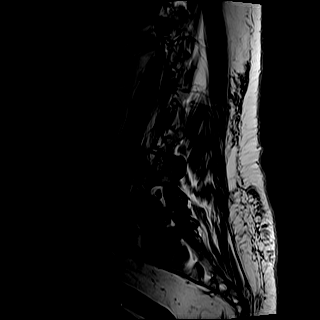
[im 16/16]
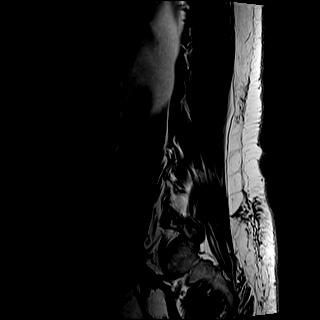

[Series 7: STIR · sagittal · 4.0mm · 0.51mm/px · 1 of 16 slices shown]
[im 1/16]
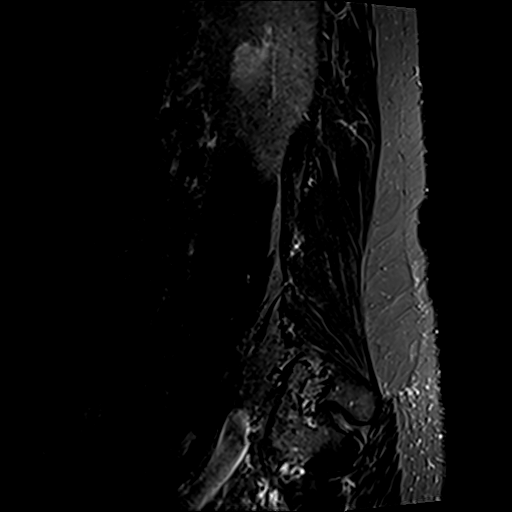

[Series 8: T2 · axial · 4.0mm · 0.70mm/px · z∈[-96,+73]mm · 8 of 31 slices shown (2 of 2)]
[im 1/31]
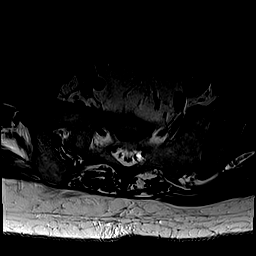
[im 5/31]
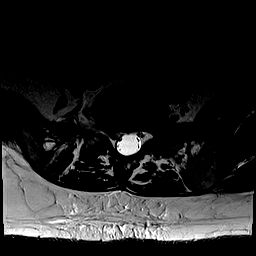
[im 10/31]
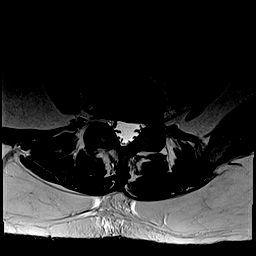
[im 14/31]
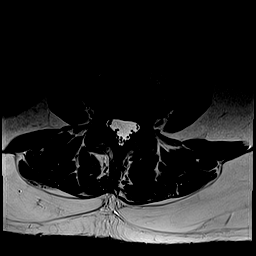
[im 17/31]
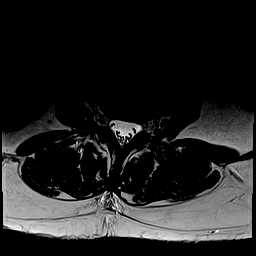
[im 21/31]
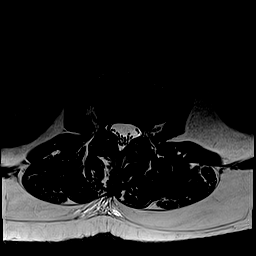
[im 26/31]
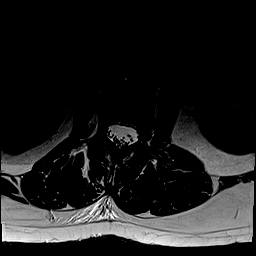
[im 31/31]
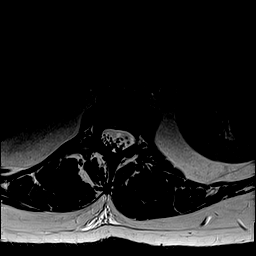

[Series 9: T1 · axial · 4.0mm · 0.35mm/px · z∈[-96,+73]mm · 8 of 31 slices shown (2 of 2)]
[im 1/31]
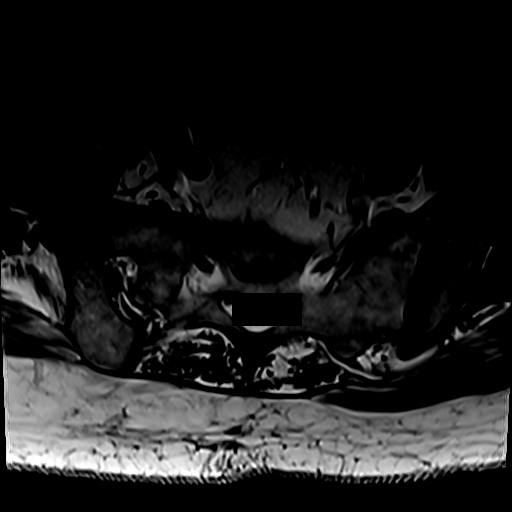
[im 5/31]
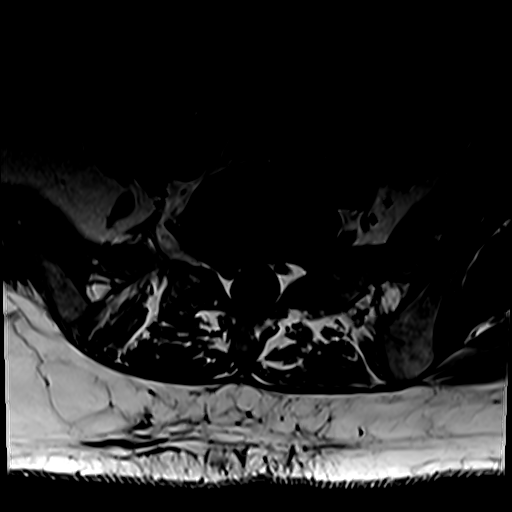
[im 10/31]
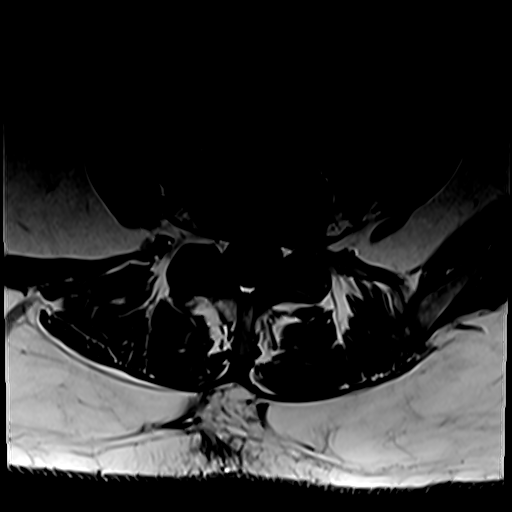
[im 14/31]
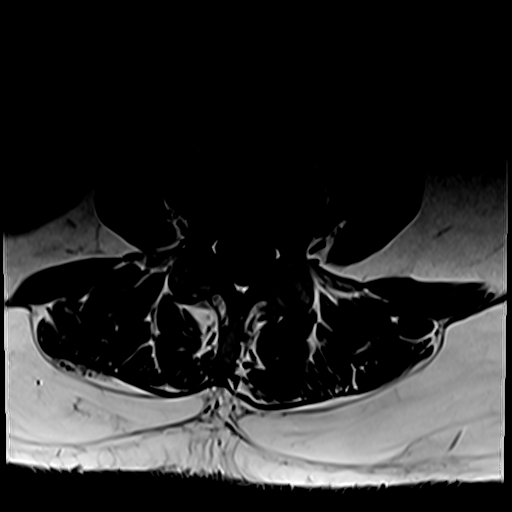
[im 17/31]
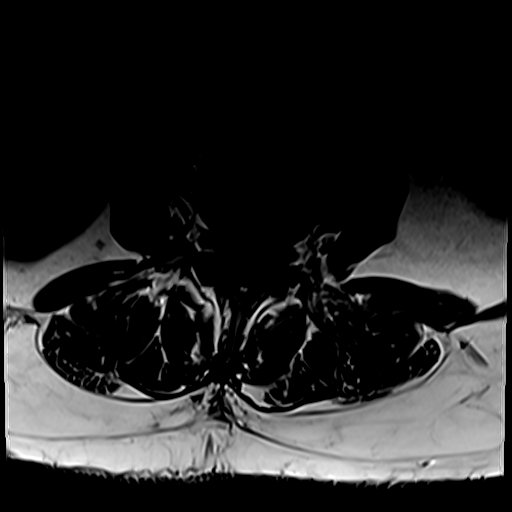
[im 21/31]
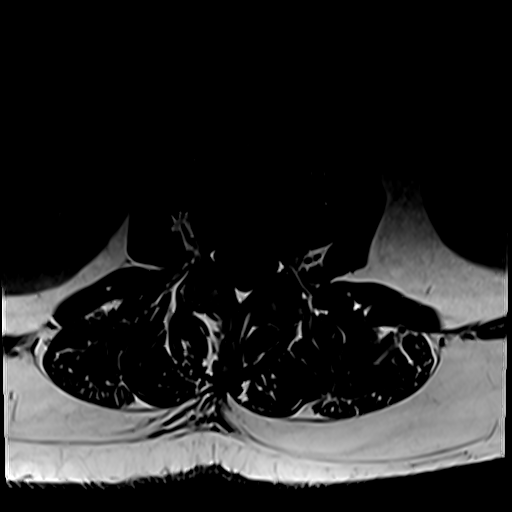
[im 26/31]
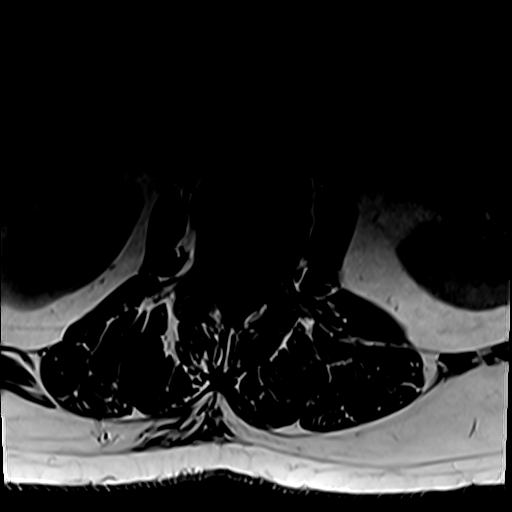
[im 31/31]
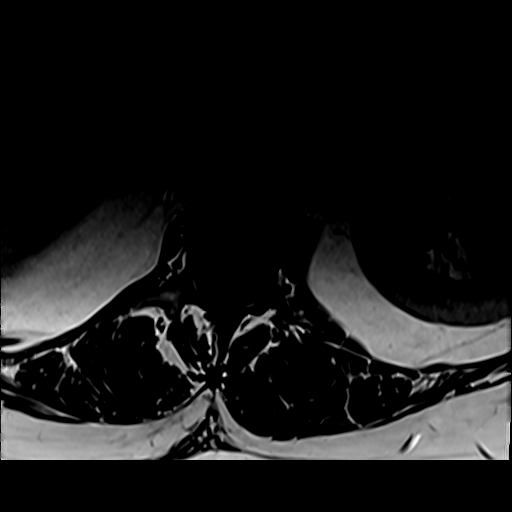

[30 of 48 positions shown; findings below may reference images not displayed]

FINDINGS: Segmentation: Standard; the lowest formed disc space is designated
L5-S1

Alignment: There is levocurvature centered at L2-L3. There is no
antero or retrolisthesis.

Vertebrae: Vertebral body heights are preserved. A small focus of T1
and T2 hyperintensity in the T12 vertebral body likely reflects an
intraosseous hemangioma. There is mild degenerative endplate marrow
signal abnormality at L5-S1. There is no suspicious marrow signal
abnormality or marrow edema.

Conus medullaris and cauda equina: Conus extends to the L1 level.
Conus and cauda equina appear normal.

Paraspinal and other soft tissues: Unremarkable.

Disc levels:

There is mild disc desiccation without significant loss of height at
L1-L2 and L5-S1. The other disc spaces are overall preserved. There
is mild multilevel facet arthropathy, most advanced on the right at
L5-S1.

T12-L1: No significant spinal canal or neural foraminal stenosis.

L1-L2: There is a small left subarticular zone/foraminal protrusion
with annular fissure without significant spinal canal or neural
foraminal stenosis.

L2-L3: Mild facet arthropathy without significant spinal canal or
neural foraminal stenosis.

L3-L4: There is a mild disc bulge and mild bilateral facet
arthropathy without significant spinal canal or neural foraminal
stenosis

L4-L5: Mild bilateral facet arthropathy without significant spinal
canal or neural foraminal stenosis

L5-S1: There is a prominent right subarticular zone protrusion and
mild bilateral facet arthropathy resulting in impingement of the
traversing right S1 nerve root (5-5, 8-26). There is no significant
spinal canal or neural foraminal stenosis.
IMPRESSION: 1. Prominent right subarticular zone protrusion at L5-S1 which
impinges the traversing S1 nerve root, likely accounting for the
patient's symptoms.
2. Otherwise, mild degenerative changes in the lumbar spine without
significant spinal canal or neural foraminal stenosis, and no other
evidence of nerve root impingement.
3. Levoscoliosis centered at L2-L3

## 2021-11-01 DIAGNOSIS — M5416 Radiculopathy, lumbar region: Secondary | ICD-10-CM | POA: Diagnosis not present

## 2021-11-07 DIAGNOSIS — M5416 Radiculopathy, lumbar region: Secondary | ICD-10-CM | POA: Diagnosis not present

## 2021-11-07 DIAGNOSIS — M5117 Intervertebral disc disorders with radiculopathy, lumbosacral region: Secondary | ICD-10-CM | POA: Diagnosis not present

## 2021-11-07 DIAGNOSIS — M5127 Other intervertebral disc displacement, lumbosacral region: Secondary | ICD-10-CM | POA: Diagnosis not present

## 2022-02-25 ENCOUNTER — Other Ambulatory Visit: Payer: Self-pay | Admitting: Family Medicine

## 2022-04-05 ENCOUNTER — Other Ambulatory Visit: Payer: Self-pay | Admitting: Family Medicine

## 2022-04-05 DIAGNOSIS — I10 Essential (primary) hypertension: Secondary | ICD-10-CM

## 2022-04-08 ENCOUNTER — Other Ambulatory Visit: Payer: Self-pay | Admitting: Family Medicine

## 2022-04-08 DIAGNOSIS — L680 Hirsutism: Secondary | ICD-10-CM

## 2022-04-09 ENCOUNTER — Telehealth: Payer: Self-pay

## 2022-04-09 NOTE — Chronic Care Management (AMB) (Signed)
  Care Management   Outreach Note  04/09/2022 Name: Franceska Strahm MRN: [id number] DOB: August 01, 1966  An unsuccessful telephone outreach was attempted today. The patient was referred to the case management team for assistance with care management and care coordination.   Follow Up Plan:  The care management team will reach out to the patient again over the next 7 days.  If patient returns call to provider office, please advise to call [facility] * at [phone]*  Noreene Larsson, [place], [place] [address] Direct Dial: [phone] [email or web address]

## 2022-04-11 ENCOUNTER — Other Ambulatory Visit: Payer: Self-pay | Admitting: Family Medicine

## 2022-04-11 ENCOUNTER — Telehealth: Payer: Self-pay | Admitting: Family Medicine

## 2022-04-11 DIAGNOSIS — I10 Essential (primary) hypertension: Secondary | ICD-10-CM

## 2022-04-11 MED ORDER — AMLODIPINE BESYLATE 10 MG PO TABS: 10.0000 mg | ORAL_TABLET | Freq: Every day | ORAL | 0 refills | Status: DC

## 2022-04-11 NOTE — Telephone Encounter (Signed)
  Prescription Request  04/11/2022  Is this a "Controlled Substance" medicine? no  Have you seen your PCP in the last 2 weeks? Appt made for 9/5  If YES, route message to pool  -  If NO, patient needs to be scheduled for appointment.  What is the name of the medication or equipment? amLODipine (NORVASC) 10 MG tablet  Have you contacted your pharmacy to request a refill? yes   Which pharmacy would you like this sent to? [facility] in [place]   Patient notified that their request is being sent to the clinical staff for review and that they should receive a response within 2 business days.

## 2022-04-12 NOTE — Telephone Encounter (Signed)
NA/VM full refill sent to pharmacy

## 2022-04-20 ENCOUNTER — Other Ambulatory Visit: Payer: Self-pay | Admitting: *Deleted

## 2022-04-20 NOTE — Patient Outreach (Signed)
  Care Coordination   04/20/2022 Name: Dorathy Stallone MRN: [id number] DOB: 1966/04/17   Care Coordination Outreach Attempts:  An unsuccessful telephone outreach was attempted today to offer the patient information about available care coordination services as a benefit of their health plan.   Follow Up Plan:  Additional outreach attempts will be made to offer the patient care coordination information and services.   Encounter Outcome:  No Answer  Care Coordination Interventions Activated:  No   Care Coordination Interventions:  No, not indicated    SIG Nakeisha Greenhouse L. Lavina Hamman, RN, BSN, [place] Coordinator Office number [phone]

## 2022-04-26 ENCOUNTER — Ambulatory Visit: Payer: Self-pay | Admitting: *Deleted

## 2022-04-26 NOTE — Patient Outreach (Signed)
  Care Coordination   04/26/2022 Name: Ann Peterson MRN: [id number] DOB: [birth year]   Care Coordination Outreach Attempts:  A second unsuccessful outreach was attempted today to offer the patient with information about available care coordination services as a benefit of their health plan.    Voice mail box is full unable to leave a message  Follow Up Plan:  Additional outreach attempts will be made to offer the patient care coordination information and services.   Encounter Outcome:  No Answer  Care Coordination Interventions Activated:  No   Care Coordination Interventions:  No, not indicated    SIG Dariann Huckaba L. Lavina Hamman, RN, BSN, [place] Coordinator Office number [phone]

## 2022-04-30 ENCOUNTER — Other Ambulatory Visit: Payer: Self-pay | Admitting: *Deleted

## 2022-04-30 DIAGNOSIS — M545 Low back pain, unspecified: Secondary | ICD-10-CM | POA: Insufficient documentation

## 2022-04-30 NOTE — Patient Outreach (Addendum)
  Care Coordination   04/30/2022 Name: Shahla Betsill MRN: [id number] DOB: 15-Feb-1966   Care Coordination Outreach Attempts:  A third unsuccessful outreach was attempted today to offer the patient with information about available care coordination services as a benefit of their health plan.   Follow Up Plan:  No further outreach attempts will be made at this time. We have been unable to contact the patient to offer or enroll patient in care coordination services  Encounter Outcome:  No Answer  Care Coordination Interventions Activated:  No   Care Coordination Interventions:  No, not indicated    SIG Nichola Warren L. Lavina Hamman, RN, BSN, [place] Coordinator Office number [phone]

## 2022-05-15 ENCOUNTER — Ambulatory Visit: Payer: BC Managed Care – PPO | Admitting: Family Medicine

## 2022-05-15 ENCOUNTER — Encounter: Payer: Self-pay | Admitting: Family Medicine

## 2022-05-15 ENCOUNTER — Encounter (INDEPENDENT_AMBULATORY_CARE_PROVIDER_SITE_OTHER): Payer: BC Managed Care – PPO | Admitting: Family Medicine

## 2022-05-15 VITALS — BP 125/74 | HR 80 | Temp 98.3°F | Ht 63.0 in | Wt 215.4 lb

## 2022-05-15 DIAGNOSIS — L680 Hirsutism: Secondary | ICD-10-CM

## 2022-05-15 DIAGNOSIS — Z85850 Personal history of malignant neoplasm of thyroid: Secondary | ICD-10-CM

## 2022-05-15 DIAGNOSIS — M159 Polyosteoarthritis, unspecified: Secondary | ICD-10-CM

## 2022-05-15 DIAGNOSIS — E119 Type 2 diabetes mellitus without complications: Secondary | ICD-10-CM

## 2022-05-15 DIAGNOSIS — E8881 Metabolic syndrome: Secondary | ICD-10-CM | POA: Diagnosis not present

## 2022-05-15 DIAGNOSIS — Z0001 Encounter for general adult medical examination with abnormal findings: Secondary | ICD-10-CM | POA: Diagnosis not present

## 2022-05-15 DIAGNOSIS — I10 Essential (primary) hypertension: Secondary | ICD-10-CM | POA: Diagnosis not present

## 2022-05-15 DIAGNOSIS — F5101 Primary insomnia: Secondary | ICD-10-CM

## 2022-05-15 DIAGNOSIS — E890 Postprocedural hypothyroidism: Secondary | ICD-10-CM

## 2022-05-15 DIAGNOSIS — Z0000 Encounter for general adult medical examination without abnormal findings: Secondary | ICD-10-CM

## 2022-05-15 DIAGNOSIS — L819 Disorder of pigmentation, unspecified: Secondary | ICD-10-CM

## 2022-05-15 MED ORDER — AMLODIPINE BESYLATE 10 MG PO TABS
10.0000 mg | ORAL_TABLET | Freq: Every day | ORAL | 3 refills | Status: DC
  Filled 2022-08-14 – 2022-08-15 (×2): qty 90, 90d supply, fill #0
  Filled 2022-10-25 – 2022-11-06 (×2): qty 90, 90d supply, fill #1
  Filled 2023-02-08: qty 90, 90d supply, fill #2

## 2022-05-15 MED ORDER — LEVOTHYROXINE SODIUM 175 MCG PO TABS
175.0000 ug | ORAL_TABLET | Freq: Every day | ORAL | 3 refills | Status: DC
  Filled 2022-08-14 – 2022-08-15 (×2): qty 90, 90d supply, fill #0
  Filled 2022-10-25 – 2022-11-06 (×2): qty 90, 90d supply, fill #1
  Filled 2023-02-08: qty 90, 90d supply, fill #2

## 2022-05-15 MED ORDER — METOPROLOL SUCCINATE ER 100 MG PO TB24
100.0000 mg | ORAL_TABLET | Freq: Every day | ORAL | 3 refills | Status: DC
  Filled 2022-08-14 – 2022-08-15 (×2): qty 90, 90d supply, fill #0
  Filled 2022-10-25 – 2022-11-06 (×2): qty 90, 90d supply, fill #1
  Filled 2023-02-08: qty 90, 90d supply, fill #2

## 2022-05-15 MED ORDER — TRAZODONE HCL 50 MG PO TABS
25.0000 mg | ORAL_TABLET | Freq: Every evening | ORAL | 3 refills | Status: DC | PRN
  Filled 2022-08-14 – 2022-08-15 (×2): qty 180, 90d supply, fill #0
  Filled 2022-10-25 – 2022-12-10 (×2): qty 180, 90d supply, fill #1
  Filled 2023-02-08 – 2023-04-26 (×2): qty 180, 90d supply, fill #2

## 2022-05-15 NOTE — Progress Notes (Signed)
Ann Peterson is a 56 y.o. female presents to office today for annual physical exam examination.    Concerns today include: 1. No new issues  Occupation: starting to work for [employer] starting Monday (this is a great change as she is overwhelmed in her current role), Marital status: widowed, Substance use: none Diet: fair, Exercise: no structured Last eye exam: needs Last colonoscopy: UTD, Dr Liliane Channel office Last mammogram: needs Last pap smear: UTD Refills needed today: all Immunizations needed: currently getting Hep B vaccines.  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   COVID-19 12/03/2019   body aches, joint pain, fever 104 x 4 days, then symptoms resolved   History of kidney stones    Hyperlipidemia    Hypertension    PONV (postoperative nausea and vomiting)    n/v after appendectomy 5-6 yrs ago   Sleep apnea    uses cpap   Thyroid disease    thyroidectomy due to cancer   Social History   Socioeconomic History   Marital status: Widowed    Spouse name: Not on file   Number of children: Not on file   Years of education: Not on file   Highest education level: Not on file  Occupational History   Not on file  Tobacco Use   Smoking status: Never   Smokeless tobacco: Never  Vaping Use   Vaping Use: Never used  Substance and Sexual Activity   Alcohol use: No   Drug use: No   Sexual activity: Not on file  Other Topics Concern   Not on file  Social History Narrative   Not on file   Social Determinants of Health   Financial Resource Strain: Not on file  Food Insecurity: Not on file  Transportation Needs: Not on file  Physical Activity: Not on file  Stress: Not on file  Social Connections: Not on file   Intimate Partner Violence: Not on file   Past Surgical History:  Procedure Laterality Date   ABDOMINAL HYSTERECTOMY  09/12/2016   partial   APPENDECTOMY  2011   CESAREAN SECTION     x 3   CHOLECYSTECTOMY  2000   laproscopic   CYSTOSCOPY W/ URETERAL STENT PLACEMENT N/A 12/03/2019   Procedure: CYSTOSCOPY WITH STENT PLACEMENT, RETROGRADE;  Surgeon: Cleon Gustin, MD;  Location: [facility];  Service: Urology;  Laterality: N/A;   CYSTOSCOPY WITH RETROGRADE PYELOGRAM, URETEROSCOPY AND STENT PLACEMENT Left 12/24/2019   Procedure: CYSTOSCOPY WITH RETROGRADE PYELOGRAM, URETEROSCOPY, STENT REMOVAL AND STONE EXTRACTION;  Surgeon: Cleon Gustin, MD;  Location: [facility];  Service: Urology;  Laterality: Left;   CYSTOSCOPY WITH RETROGRADE PYELOGRAM, URETEROSCOPY AND STENT PLACEMENT Bilateral 05/04/2021   Procedure: CYSTOSCOPY WITH RETROGRADE PYELOGRAM, URETEROSCOPY AND STENT PLACEMENT;  Surgeon: Cleon Gustin, MD;  Location: [facility];  Service: Urology;  Laterality: Bilateral;   HOLMIUM LASER APPLICATION Bilateral 1/61/0960   Procedure: HOLMIUM LASER APPLICATION;  Surgeon: Cleon Gustin, MD;  Location: [facility];  Service: Urology;  Laterality: Bilateral;   TOTAL THYROIDECTOMY  2000   sx and radiation done   Family History  Problem Relation Age of Onset   Hypertension Mother    Dementia Mother    Macular degeneration Mother    Stroke Mother  COPD Father        lung cancer   Cancer Brother        colon cancer   Cancer Brother        brain tumor    Current Outpatient Medications:    albuterol (VENTOLIN HFA) 108 (90 Base) MCG/ACT inhaler, Inhale 2 puffs into the lungs every 6 (six) hours as needed for wheezing or shortness of breath., Disp: 8 g, Rfl: 2   amLODipine (NORVASC) 10 MG tablet, Take 1 tablet (10 mg total) by mouth daily. Last RF until OV, Disp: 90 tablet, Rfl: 0   BIOTIN PO, Take 1 capsule by mouth every evening., Disp: , Rfl:    Cholecalciferol  (VITAMIN D) 50 MCG (2000 UT) CAPS, Take 1 capsule by mouth every evening., Disp: , Rfl:    fluticasone (FLONASE) 50 MCG/ACT nasal spray, SPRAY 2 SPRAYS INTO EACH NOSTRIL EVERY DAY, Disp: 48 mL, Rfl: 1   indapamide (LOZOL) 2.5 MG tablet, Take 1 tablet (2.5 mg total) by mouth daily., Disp: 30 tablet, Rfl: 11   Insulin Pen Needle (BD PEN NEEDLE MICRO U/F) 32G X 6 MM MISC, Use to inject saxenda daily, Disp: 100 each, Rfl: 1   ketoconazole (NIZORAL) 2 % cream, Apply 1 application topically daily., Disp: 60 g, Rfl: 0   levothyroxine (SYNTHROID) 175 MCG tablet, TAKE 1 TABLET BY MOUTH EVERY DAY, Disp: 90 tablet, Rfl: 3   Liraglutide -Weight Management (SAXENDA) 18 MG/3ML SOPN, Inject 3 mg into the skin daily., Disp: 15 mL, Rfl: 5   magnesium oxide (MAG-OX) 400 MG tablet, Take 400 mg by mouth daily., Disp: , Rfl:    meloxicam (MOBIC) 15 MG tablet, Take 15 mg by mouth daily., Disp: , Rfl:    metFORMIN (GLUCOPHAGE) 500 MG tablet, Take 1 tablet (500 mg total) by mouth daily with breakfast. (NEEDS TO BE SEEN BEFORE NEXT REFILL), Disp: 30 tablet, Rfl: 0   metoprolol succinate (TOPROL-XL) 100 MG 24 hr tablet, Take 1 tablet (100 mg total) by mouth daily. With or immediately following a meal. Needs Physical, Disp: 90 tablet, Rfl: 0   nitrofurantoin, macrocrystal-monohydrate, (MACROBID) 100 MG capsule, Take 1 capsule (100 mg total) by mouth every 12 (twelve) hours., Disp: 14 capsule, Rfl: 0   predniSONE (STERAPRED UNI-PAK 21 TAB) 10 MG (21) TBPK tablet, Take as directed, Disp: 21 tablet, Rfl: 0   spironolactone (ALDACTONE) 50 MG tablet, Take 1 tablet (50 mg total) by mouth 2 (two) times daily. (NEEDS TO BE SEEN BEFORE NEXT REFILL), Disp: 60 tablet, Rfl: 0   traZODone (DESYREL) 50 MG tablet, TAKE 0.5-2 TABLETS (25-100 MG TOTAL) BY MOUTH AT BEDTIME AS NEEDED FOR SLEEP., Disp: 180 tablet, Rfl: 0   TURMERIC PO, Take 1 capsule by mouth at bedtime. , Disp: , Rfl:   Allergies  Allergen Reactions   Pollen Extract       ROS: Review of Systems A comprehensive review of systems was negative except for: Eyes: positive for contacts/glasses Behavioral/Psych: positive for stress    Physical exam BP 125/74   Pulse 80   Temp 98.3 F (36.8 C)   Ht 5' 3"  (1.6 m)   Wt 215 lb 6.4 oz (97.7 kg)   LMP 09/07/2016 (Exact Date)   SpO2 94%   BMI 38.16 kg/m  General appearance: alert, cooperative, appears stated age, no distress, and moderately obese Head: Normocephalic, without obvious abnormality, atraumatic Eyes: negative findings: lids and lashes normal, conjunctivae and sclerae normal, corneas clear, and pupils equal, round, reactive  to light and accomodation Ears: normal TM's and external ear canals both ears Nose: Nares normal. Septum midline. Mucosa normal. No drainage or sinus tenderness. Throat: lips, mucosa, and tongue normal; teeth and gums normal Neck: no adenopathy, no carotid bruit, no JVD, supple, symmetrical, trachea midline, and healed horizontal scar along base of neck Back: symmetric, no curvature. ROM normal. No CVA tenderness. Lungs: clear to auscultation bilaterally Heart: regular rate and rhythm, S1, S2 normal, no murmur, click, rub or gallop Abdomen: soft, non-tender; bowel sounds normal; no masses,  no organomegaly, obese/ protuberant Extremities: extremities normal, atraumatic, no cyanosis or edema Pulses: 2+ and symmetric Skin: Skin color, texture, turgor normal. Several areas of pigmented skin lesions. Has a flesh colored lesion of the scalp. Lymph nodes: Cervical, supraclavicular, and axillary nodes normal. Neurologic: Grossly normal Psych: mood stable, speech normal. Pleasant, interactive     05/15/2022    4:46 PM 03/07/2021    3:17 PM 09/05/2020    1:06 PM  Depression screen PHQ 2/9  Decreased Interest 0 1 0  Down, Depressed, Hopeless 0 1 0  PHQ - 2 Score 0 2 0  Altered sleeping  1 0  Tired, decreased energy  0 0  Change in appetite  0 0  Feeling bad or failure about  yourself   0 0  Trouble concentrating  0 0  Moving slowly or fidgety/restless  0 0  Suicidal thoughts  0 0  PHQ-9 Score  3 0      05/15/2022    4:46 PM 03/07/2021    3:18 PM  GAD 7 : Generalized Anxiety Score  Nervous, Anxious, on Edge 0 0  Control/stop worrying 0 0  Worry too much - different things 0 1  Trouble relaxing 0 0  Restless 0 0  Easily annoyed or irritable 0 1  Afraid - awful might happen 0 0  Total GAD 7 Score 0 2  Anxiety Difficulty Not difficult at all Not difficult at all   Assessment/ Plan: [name] here for annual physical exam.   Annual physical exam  Morbid obesity ([place]) - Plan: TSH, CMP14+EGFR, Lipid panel, Bayer DCA Hb A1c Waived, Bayer DCA Hb A1c Waived, Lipid panel, CMP14+EGFR, TSH  Essential hypertension - Plan: CMP14+EGFR, amLODipine (NORVASC) 10 MG tablet, metoprolol succinate (TOPROL-XL) 100 MG 24 hr tablet, CMP14+EGFR  Postoperative hypothyroidism - Plan: TSH, CMP14+EGFR, CBC, T4, free, levothyroxine (SYNTHROID) 175 MCG tablet, T4, free, CBC, CMP14+EGFR, TSH  History of thyroid cancer - Plan: CBC, CBC  Insulin resistance - Plan: Bayer DCA Hb A1c Waived, Bayer DCA Hb A1c Waived  Hirsutism  Primary insomnia - Plan: traZODone (DESYREL) 50 MG tablet  Pigmented skin lesion - Plan: Ambulatory referral to Dermatology   Check nonfasting labs Pt to schedule mammogram Lifestyle modification needed for weight loss. Pt to start focusing on wellbeing now that she is switching to a less demanding position at work Meds have been renewed Referral to dermatology for pigmented skin lesions.  Counseled on healthy lifestyle choices, including diet (rich in fruits, vegetables and lean meats and low in salt and simple carbohydrates) and exercise (at least 30 minutes of moderate physical activity daily).   Tavoris Brisk M. Lajuana Ripple, DO

## 2022-05-15 NOTE — Patient Instructions (Signed)
Get mammogram scheduled ASAP. Referral placed for skin check  Preventive Care 55-55 Years Old, Female Preventive care refers to lifestyle choices and visits with your health care provider that can promote health and wellness. Preventive care visits are also called wellness exams. What can I expect for my preventive care visit? Counseling Your health care provider may ask you questions about your: Medical history, including: Past medical problems. Family medical history. Pregnancy history. Current health, including: Menstrual cycle. Method of birth control. Emotional well-being. Home life and relationship well-being. Sexual activity and sexual health. Lifestyle, including: Alcohol, nicotine or tobacco, and drug use. Access to firearms. Diet, exercise, and sleep habits. Work and work Statistician. Sunscreen use. Safety issues such as seatbelt and bike helmet use. Physical exam Your health care provider will check your: Height and weight. These may be used to calculate your BMI (body mass index). BMI is a measurement that tells if you are at a healthy weight. Waist circumference. This measures the distance around your waistline. This measurement also tells if you are at a healthy weight and may help predict your risk of certain diseases, such as type 2 diabetes and high blood pressure. Heart rate and blood pressure. Body temperature. Skin for abnormal spots. What immunizations do I need?  Vaccines are usually given at various ages, according to a schedule. Your health care provider will recommend vaccines for you based on your age, medical history, and lifestyle or other factors, such as travel or where you work. What tests do I need? Screening Your health care provider may recommend screening tests for certain conditions. This may include: Lipid and cholesterol levels. Diabetes screening. This is done by checking your blood sugar (glucose) after you have not eaten for a while  (fasting). Pelvic exam and Pap test. Hepatitis B test. Hepatitis C test. HIV (human immunodeficiency virus) test. STI (sexually transmitted infection) testing, if you are at risk. Lung cancer screening. Colorectal cancer screening. Mammogram. Talk with your health care provider about when you should start having regular mammograms. This may depend on whether you have a family history of breast cancer. BRCA-related cancer screening. This may be done if you have a family history of breast, ovarian, tubal, or peritoneal cancers. Bone density scan. This is done to screen for osteoporosis. Talk with your health care provider about your test results, treatment options, and if necessary, the need for more tests. Follow these instructions at home: Eating and drinking  Eat a diet that includes fresh fruits and vegetables, whole grains, lean protein, and low-fat dairy products. Take vitamin and mineral supplements as recommended by your health care provider. Do not drink alcohol if: Your health care provider tells you not to drink. You are pregnant, may be pregnant, or are planning to become pregnant. If you drink alcohol: Limit how much you have to 0-1 drink a day. Know how much alcohol is in your drink. In the [place] one drink equals one 12 oz bottle of beer (355 mL), one 5 oz glass of wine (148 mL), or one 1 oz glass of hard liquor (44 mL). Lifestyle Brush your teeth every morning and night with fluoride toothpaste. Floss one time each day. Exercise for at least 30 minutes 5 or more days each week. Do not use any products that contain nicotine or tobacco. These products include cigarettes, chewing tobacco, and vaping devices, such as e-cigarettes. If you need help quitting, ask your health care provider. Do not use drugs. If you are sexually active, practice safe  sex. Use a condom or other form of protection to prevent STIs. If you do not wish to become pregnant, use a form of birth control. If  you plan to become pregnant, see your health care provider for a prepregnancy visit. Take aspirin only as told by your health care provider. Make sure that you understand how much to take and what form to take. Work with your health care provider to find out whether it is safe and beneficial for you to take aspirin daily. Find healthy ways to manage stress, such as: Meditation, yoga, or listening to music. Journaling. Talking to a trusted person. Spending time with friends and family. Minimize exposure to UV radiation to reduce your risk of skin cancer. Safety Always wear your seat belt while driving or riding in a vehicle. Do not drive: If you have been drinking alcohol. Do not ride with someone who has been drinking. When you are tired or distracted. While texting. If you have been using any mind-altering substances or drugs. Wear a helmet and other protective equipment during sports activities. If you have firearms in your house, make sure you follow all gun safety procedures. Seek help if you have been physically or sexually abused. What's next? Visit your health care provider once a year for an annual wellness visit. Ask your health care provider how often you should have your eyes and teeth checked. Stay up to date on all vaccines. This information is not intended to replace advice given to you by your health care provider. Make sure you discuss any questions you have with your health care provider. Document Revised: 02/22/2021 Document Reviewed: 02/22/2021 Elsevier Patient Education  [place].

## 2022-05-16 LAB — CMP14+EGFR
ALT: 73 IU/L — ABNORMAL HIGH (ref 0–32)
AST: 43 IU/L — ABNORMAL HIGH (ref 0–40)
Albumin/Globulin Ratio: 2 (ref 1.2–2.2)
Albumin: 4.5 g/dL (ref 3.8–4.9)
Alkaline Phosphatase: 86 IU/L (ref 44–121)
BUN/Creatinine Ratio: 15 (ref 9–23)
BUN: 10 mg/dL (ref 6–24)
Bilirubin Total: 0.4 mg/dL (ref 0.0–1.2)
CO2: 28 mmol/L (ref 20–29)
Calcium: 9.8 mg/dL (ref 8.7–10.2)
Chloride: 94 mmol/L — ABNORMAL LOW (ref 96–106)
Creatinine, Ser: 0.67 mg/dL (ref 0.57–1.00)
Globulin, Total: 2.2 g/dL (ref 1.5–4.5)
Glucose: 239 mg/dL — ABNORMAL HIGH (ref 70–99)
Potassium: 3.4 mmol/L — ABNORMAL LOW (ref 3.5–5.2)
Sodium: 137 mmol/L (ref 134–144)
Total Protein: 6.7 g/dL (ref 6.0–8.5)
eGFR: 103 mL/min/{1.73_m2} (ref >59)

## 2022-05-16 LAB — LIPID PANEL
Chol/HDL Ratio: 7.3 ratio — ABNORMAL HIGH (ref 0.0–4.4)
Cholesterol, Total: 263 mg/dL — ABNORMAL HIGH (ref 100–199)
HDL: 36 mg/dL — ABNORMAL LOW (ref >39)
LDL Chol Calc (NIH): 121 mg/dL — ABNORMAL HIGH (ref 0–99)
Triglycerides: 593 mg/dL (ref 0–149)
VLDL Cholesterol Cal: 106 mg/dL — ABNORMAL HIGH (ref 5–40)

## 2022-05-16 LAB — CBC
Hematocrit: 42 % (ref 34.0–46.6)
Hemoglobin: 14.9 g/dL (ref 11.1–15.9)
MCH: 32 pg (ref 26.6–33.0)
MCHC: 35.5 g/dL (ref 31.5–35.7)
MCV: 90 fL (ref 79–97)
Platelets: 211 10*3/uL (ref 150–450)
RBC: 4.65 x10E6/uL (ref 3.77–5.28)
RDW: 12.8 % (ref 11.7–15.4)
WBC: 5.9 10*3/uL (ref 3.4–10.8)

## 2022-05-16 LAB — BAYER DCA HB A1C WAIVED: HB A1C (BAYER DCA - WAIVED): 8.2 % — ABNORMAL HIGH (ref 4.8–5.6)

## 2022-05-16 LAB — TSH: TSH: 17.1 u[IU]/mL — ABNORMAL HIGH (ref 0.450–4.500)

## 2022-05-16 LAB — T4, FREE: Free T4: 1.44 ng/dL (ref 0.82–1.77)

## 2022-05-16 MED ORDER — CELECOXIB 50 MG PO CAPS
50.0000 mg | ORAL_CAPSULE | Freq: Two times a day (BID) | ORAL | 99 refills | Status: DC | PRN
  Filled 2022-08-14 – 2022-08-15 (×2): qty 60, 30d supply, fill #0
  Filled 2022-10-25: qty 37, 19d supply, fill #1

## 2022-05-16 NOTE — Addendum Note (Signed)
Addended by: Janora Norlander on: 05/16/2022 09:08 AM   Modules accepted: Orders

## 2022-05-18 DIAGNOSIS — G4733 Obstructive sleep apnea (adult) (pediatric): Secondary | ICD-10-CM | POA: Diagnosis not present

## 2022-05-21 ENCOUNTER — Ambulatory Visit: Payer: BC Managed Care – PPO

## 2022-05-27 ENCOUNTER — Other Ambulatory Visit: Payer: Self-pay | Admitting: Urology

## 2022-06-11 MED ORDER — OZEMPIC (0.25 OR 0.5 MG/DOSE) 2 MG/3ML ~~LOC~~ SOPN
PEN_INJECTOR | SUBCUTANEOUS | 3 refills | Status: DC
  Filled 2022-08-14: qty 3, 28d supply, fill #0

## 2022-06-11 NOTE — Telephone Encounter (Signed)
Please see the MyChart message reply(ies) for my assessment and plan.   Meds ordered this encounter  Medications   Semaglutide,0.25 or 0.'5MG'$ /DOS, (OZEMPIC, 0.25 OR 0.5 MG/DOSE,) 2 MG/3ML SOPN    Sig: Start with 0.'25mg'$  injected sub-q every 7 days for month #1.  Then increase to 0.'5mg'$  sub-q every 7 days starting month #2.    Dispense:  9 mL    Refill:  3      This patient gave consent for this Medical Advice Message and is aware that it may result in a bill to [employer], as well as the possibility of receiving a bill for a co-payment or deductible. They are an established patient, but are not seeking medical advice exclusively about a problem treated during an in person or video visit in the last seven days. I did not recommend an in person or video visit within seven days of my reply.    I spent a total of 5 minutes cumulative time within 7 days through [employer].  Ronnie Doss, DO

## 2022-06-14 ENCOUNTER — Ambulatory Visit: Payer: BC Managed Care – PPO

## 2022-08-14 ENCOUNTER — Other Ambulatory Visit (HOSPITAL_BASED_OUTPATIENT_CLINIC_OR_DEPARTMENT_OTHER): Payer: Self-pay

## 2022-08-14 MED FILL — Indapamide Tab 2.5 MG: ORAL | 90 days supply | Qty: 90 | Fill #0 | Status: CN

## 2022-08-15 ENCOUNTER — Other Ambulatory Visit: Payer: Self-pay

## 2022-08-15 ENCOUNTER — Telehealth: Payer: Self-pay | Admitting: Family Medicine

## 2022-08-15 ENCOUNTER — Ambulatory Visit: Payer: 59 | Admitting: Family Medicine

## 2022-08-15 ENCOUNTER — Encounter (HOSPITAL_BASED_OUTPATIENT_CLINIC_OR_DEPARTMENT_OTHER): Payer: Self-pay | Admitting: Pharmacist

## 2022-08-15 ENCOUNTER — Other Ambulatory Visit (HOSPITAL_COMMUNITY): Payer: Self-pay

## 2022-08-15 ENCOUNTER — Encounter: Payer: Self-pay | Admitting: Family Medicine

## 2022-08-15 ENCOUNTER — Other Ambulatory Visit (HOSPITAL_BASED_OUTPATIENT_CLINIC_OR_DEPARTMENT_OTHER): Payer: Self-pay

## 2022-08-15 ENCOUNTER — Telehealth: Payer: Self-pay

## 2022-08-15 VITALS — BP 113/74 | HR 69 | Temp 98.2°F | Ht 63.0 in | Wt 209.8 lb

## 2022-08-15 DIAGNOSIS — E119 Type 2 diabetes mellitus without complications: Secondary | ICD-10-CM | POA: Insufficient documentation

## 2022-08-15 DIAGNOSIS — M62830 Muscle spasm of back: Secondary | ICD-10-CM

## 2022-08-15 DIAGNOSIS — E890 Postprocedural hypothyroidism: Secondary | ICD-10-CM

## 2022-08-15 DIAGNOSIS — I152 Hypertension secondary to endocrine disorders: Secondary | ICD-10-CM

## 2022-08-15 DIAGNOSIS — Z7985 Long-term (current) use of injectable non-insulin antidiabetic drugs: Secondary | ICD-10-CM | POA: Insufficient documentation

## 2022-08-15 DIAGNOSIS — E1159 Type 2 diabetes mellitus with other circulatory complications: Secondary | ICD-10-CM | POA: Diagnosis not present

## 2022-08-15 DIAGNOSIS — J302 Other seasonal allergic rhinitis: Secondary | ICD-10-CM

## 2022-08-15 LAB — CMP14+EGFR
ALT: 44 IU/L — ABNORMAL HIGH (ref 0–32)
AST: 25 IU/L (ref 0–40)
Albumin/Globulin Ratio: 2 (ref 1.2–2.2)
Albumin: 4.4 g/dL (ref 3.8–4.9)
Alkaline Phosphatase: 92 IU/L (ref 44–121)
BUN/Creatinine Ratio: 18 (ref 9–23)
BUN: 15 mg/dL (ref 6–24)
Bilirubin Total: 0.4 mg/dL (ref 0.0–1.2)
CO2: 25 mmol/L (ref 20–29)
Calcium: 9.2 mg/dL (ref 8.7–10.2)
Chloride: 97 mmol/L (ref 96–106)
Creatinine, Ser: 0.83 mg/dL (ref 0.57–1.00)
Globulin, Total: 2.2 g/dL (ref 1.5–4.5)
Glucose: 130 mg/dL — ABNORMAL HIGH (ref 70–99)
Potassium: 3.8 mmol/L (ref 3.5–5.2)
Sodium: 137 mmol/L (ref 134–144)
Total Protein: 6.6 g/dL (ref 6.0–8.5)
eGFR: 83 mL/min/{1.73_m2} (ref >59)

## 2022-08-15 LAB — LIPID PANEL
Chol/HDL Ratio: 6.2 ratio — ABNORMAL HIGH (ref 0.0–4.4)
Cholesterol, Total: 235 mg/dL — ABNORMAL HIGH (ref 100–199)
HDL: 38 mg/dL — ABNORMAL LOW (ref >39)
LDL Chol Calc (NIH): 154 mg/dL — ABNORMAL HIGH (ref 0–99)
Triglycerides: 232 mg/dL — ABNORMAL HIGH (ref 0–149)
VLDL Cholesterol Cal: 43 mg/dL — ABNORMAL HIGH (ref 5–40)

## 2022-08-15 LAB — BAYER DCA HB A1C WAIVED: HB A1C (BAYER DCA - WAIVED): 7 % — ABNORMAL HIGH (ref 4.8–5.6)

## 2022-08-15 MED ORDER — GLUCOSE BLOOD VI STRP
ORAL_STRIP | 99 refills | Status: AC
  Filled 2022-08-15: qty 50, 50d supply, fill #0
  Filled 2022-09-28: qty 100, 90d supply, fill #1
  Filled 2022-10-25: qty 50, 50d supply, fill #1
  Filled 2022-11-06: qty 100, 90d supply, fill #1
  Filled 2023-07-22: qty 100, 90d supply, fill #2

## 2022-08-15 MED ORDER — TIZANIDINE HCL 4 MG PO TABS
4.0000 mg | ORAL_TABLET | Freq: Four times a day (QID) | ORAL | 0 refills | Status: DC | PRN
  Filled 2022-08-15 (×2): qty 30, 8d supply, fill #0

## 2022-08-15 MED ORDER — FREESTYLE LANCETS MISC
99 refills | Status: AC
  Filled 2022-08-15: qty 100, 90d supply, fill #0
  Filled 2022-11-06: qty 100, 90d supply, fill #1
  Filled 2023-07-22: qty 100, 90d supply, fill #2

## 2022-08-15 MED ORDER — OZEMPIC (0.25 OR 0.5 MG/DOSE) 2 MG/3ML ~~LOC~~ SOPN
PEN_INJECTOR | SUBCUTANEOUS | 3 refills | Status: DC
  Filled 2022-08-15: qty 9, fill #0

## 2022-08-15 MED ORDER — FLUTICASONE PROPIONATE 50 MCG/ACT NA SUSP
2.0000 | Freq: Every day | NASAL | 1 refills | Status: DC
  Filled 2022-08-15 (×2): qty 48, 90d supply, fill #0
  Filled 2022-10-25 – 2022-11-06 (×2): qty 48, 90d supply, fill #1

## 2022-08-15 MED ORDER — OZEMPIC (0.25 OR 0.5 MG/DOSE) 2 MG/3ML ~~LOC~~ SOPN
0.5000 mg | PEN_INJECTOR | SUBCUTANEOUS | 3 refills | Status: DC
  Filled 2022-08-15: qty 3, 28d supply, fill #0

## 2022-08-15 MED ORDER — BLOOD GLUCOSE METER KIT
PACK | 0 refills | Status: AC
  Filled 2022-08-15: qty 1, 1d supply, fill #0

## 2022-08-15 MED FILL — Indapamide Tab 2.5 MG: ORAL | 90 days supply | Qty: 90 | Fill #0 | Status: AC

## 2022-08-15 NOTE — Telephone Encounter (Signed)
Pharmacy called to let PCP know that they had to change manufacturers for pts Levothyroxine Rx.

## 2022-08-15 NOTE — Telephone Encounter (Signed)
Pharmacy Patient Advocate Encounter   Received notification from [facility] that prior authorization for Ozempic '2mg'$ /78m is required/requested.    PA submitted on 08/15/22 to [employer] via [place] Status is pending

## 2022-08-15 NOTE — Progress Notes (Signed)
Subjective: CC:New onset DM PCP: Janora Norlander, DO BSW:HQPRF [place] is a 56 y.o. female presenting to clinic today for:  1. Type 2 Diabetes with hypertension, hyperlipidemia:  Unfortunately, the Ozempic was on backorder when she tried to get it and she could not get the medication.  She has since changed pharmacies to the med Center at [facility].  In the interim she has been utilizing her old Saxenda 3 mg daily.  She has been reducing snacking, increasing water consumption and trying to limit carbohydrates.  Sometimes she skips breakfast.  She does feel that she has a little bit more of a dry mouth with the Saxenda and occasionally misses the dose but overall has been doing okay since her last visit.  No chest pain, shortness of breath, polyurea, nausea, vomiting or abdominal pain.  She had her blood sugar spot checked at work and it was 140 roughly 2 hours after meal the other day  Last eye exam: needs Last foot exam: needs Last A1c:  Lab Results  Component Value Date   HGBA1C 8.2 (H) 05/15/2022   Nephropathy screen indicated?: needs Last flu, zoster and/or pneumovax:  Immunization History  Administered Date(s) Administered   Influenza-Unspecified 06/10/2017   PFIZER(Purple Top)SARS-COV-2 Vaccination 09/23/2019, 11/03/2019, 06/24/2020   Tdap 01/10/2010   2.  Hypothyroidism Since her last visit she has been off of the biotin in efforts to normalize TSH.  No reports of any unusual symptoms or signs but her hair has been thinning some since coming off the biotin  3.  Back pain Patient reports left-sided back spasm.  She started getting a little bit of a runny nose the other day so started some NyQuil at nighttime and unfortunately this caused her to deep of the sleep and she slept wrong and subsequently has a little bit of a spasm.  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   COVID-19 12/03/2019   body aches, joint pain, fever 104 x 4 days, then symptoms resolved   History of kidney stones    Hyperlipidemia    Hypertension    PONV (postoperative nausea and vomiting)    n/v after appendectomy 5-6 yrs ago   Sleep apnea    uses cpap   Thyroid disease    thyroidectomy due to cancer    Current Outpatient Medications:    albuterol (VENTOLIN HFA) 108 (90 Base) MCG/ACT inhaler, Inhale 2 puffs into the lungs every 6 (six) hours as needed for wheezing or shortness of breath., Disp: 8 g, Rfl: 2   amLODipine (NORVASC) 10 MG tablet, Take 1 tablet (10 mg total) by mouth daily., Disp: 90 tablet, Rfl: 3   BIOTIN PO, Take 1 capsule by mouth every evening., Disp: , Rfl:    celecoxib (CELEBREX) 50 MG capsule, Take 1 capsule (50 mg total) by mouth 2 (two) times daily as needed for pain., Disp: 60 capsule, Rfl: PRN   Cholecalciferol (VITAMIN D) 50 MCG (2000 UT) CAPS, Take 1 capsule by mouth every evening., Disp: , Rfl:    fluticasone (FLONASE) 50 MCG/ACT nasal spray, SPRAY 2 SPRAYS INTO EACH NOSTRIL EVERY DAY, Disp: 48 mL, Rfl: 1   indapamide (LOZOL) 2.5 MG tablet, TAKE 1 TABLET BY MOUTH EVERY  DAY, Disp: 90 tablet, Rfl: 3   levothyroxine (SYNTHROID) 175 MCG tablet, Take 1 tablet (175 mcg total) by mouth daily., Disp: 90 tablet, Rfl: 3   magnesium oxide (MAG-OX) 400 MG tablet, Take 400 mg by mouth daily., Disp: , Rfl:    metoprolol succinate (TOPROL-XL) 100 MG 24 hr tablet, Take 1 tablet (100 mg total) by mouth daily. With or immediately following a meal, Disp: 90 tablet, Rfl: 3   Semaglutide,0.25 or 0.5MG/DOS, (OZEMPIC, 0.25 OR 0.5 MG/DOSE,) 2 MG/3ML SOPN, Start with 0.39m injected sub-q every 7 days for month #1.  Then increase to 0.574msub-q every 7 days starting month #2., Disp: 9 mL, Rfl: 3   traZODone (DESYREL) 50 MG tablet, Take 0.5-2  tablets (25-100 mg total) by mouth at bedtime as needed for sleep., Disp: 180 tablet, Rfl: 3 Social History   Socioeconomic History   Marital status: Widowed    Spouse name: Not on file   Number of children: Not on file   Years of education: Not on file   Highest education level: Not on file  Occupational History   Not on file  Tobacco Use   Smoking status: Never   Smokeless tobacco: Never  Vaping Use   Vaping Use: Never used  Substance and Sexual Activity   Alcohol use: No   Drug use: No   Sexual activity: Not on file  Other Topics Concern   Not on file  Social History Narrative   Not on file   Social Determinants of Health   Financial Resource Strain: Not on file  Food Insecurity: Not on file  Transportation Needs: Not on file  Physical Activity: Not on file  Stress: Not on file  Social Connections: Not on file  Intimate Partner Violence: Not on file   Family History  Problem Relation Age of Onset   Hypertension Mother    Dementia Mother    Macular degeneration Mother    Stroke Mother    COPD Father        lung cancer   Cancer Brother        colon cancer   Cancer Brother        brain tumor    Objective: Office vital signs reviewed. BP 113/74   Pulse 69   Temp 98.2 F (36.8 C)   Ht _0  (1.6 m)   Wt 209 lb 12.8 oz (95.2 kg)   LMP 09/07/2016 (Exact Date)   SpO2 94%   BMI 37.16 kg/m   Physical Examination:  General: Awake, alert, well nourished, No acute distress HEENT: Sclera white.  No exophthalmos.  No goiter Cardio: regular rate and rhythm, S1S2 heard, no murmurs appreciated Pulm: clear to auscultation bilaterally, no wheezes, rhonchi or rales; normal work of breathing on room air Extremities: warm, well perfused, No edema, cyanosis or clubbing; +2 pulses bilaterally MSK: Antalgic gait and station.  Walking stiffly Neuro: see DM foot Diabetic Foot Exam - Simple   Simple Foot Form Diabetic Foot exam was performed with the following findings:  Yes 08/15/2022  9:37 AM  Visual Inspection No deformities, no ulcerations, no other skin breakdown bilaterally: Yes Sensation Testing Intact to touch and monofilament testing bilaterally: Yes Pulse Check Posterior Tibialis and Dorsalis pulse intact bilaterally: Yes Comments      Assessment/ Plan: [age].o. female   New onset type 2 diabetes mellitus ([place]- Plan: CMP14+EGFR, Bayer DCA Hb A1c Waived, Lipid Panel, blood glucose meter kit and supplies, Lancet Device MISC, glucose blood  test strip, Semaglutide,0.25 or 0.5MG/DOS, (OZEMPIC, 0.25 OR 0.5 MG/DOSE,) 2 MG/3ML SOPN, Microalbumin / creatinine urine ratio, DISCONTINUED: Semaglutide,0.25 or 0.5MG/DOS, (OZEMPIC, 0.25 OR 0.5 MG/DOSE,) 2 MG/3ML SOPN  Morbid obesity ([facility]) - Plan: Lipid Panel  Hypertension associated with diabetes ([place]) - Plan: Lipid Panel  Postoperative hypothyroidism  Back muscle spasm - Plan: tiZANidine (ZANAFLEX) 4 MG tablet  A1c down to 7.0.  I am going to attempt to get Ozempic again for her for ease so that she is not missing doses.  She has been on metformin previously and did not tolerate.  Therefore this was not prescribed together.  I have given her a sample to start with 0.25 mg subcutaneously every 7 days and that should last a month and then she can transition over to the 0.5 mg next month.  Lipid panel collected, liver enzymes collected.  Foot exam performed and urine microalbumin performed.  She will have diabetic eye exam performed.  Testing supplies also sent to her new pharmacy  Blood pressure well controlled without her CCB over the last couple days so she may hold the amlodipine for now.  Monitor blood pressures at home, goal less than 130/90.  No need to reinitiate if blood pressures remain controlled  Thyroid levels recollected as an add-on.  She has been off of biotin so hopefully TSH has normalized  Unfortunately having a back spasms I given her some Zanaflex to have on hand.  Continue Celebrex if  needed  No orders of the defined types were placed in this encounter.  No orders of the defined types were placed in this encounter.    Janora Norlander, DO [place] [phone]

## 2022-08-15 NOTE — Patient Instructions (Signed)

## 2022-08-16 ENCOUNTER — Encounter: Payer: Self-pay | Admitting: Family Medicine

## 2022-08-16 ENCOUNTER — Other Ambulatory Visit (HOSPITAL_BASED_OUTPATIENT_CLINIC_OR_DEPARTMENT_OTHER): Payer: Self-pay

## 2022-08-16 LAB — MICROALBUMIN / CREATININE URINE RATIO
Creatinine, Urine: 88.6 mg/dL
Microalb/Creat Ratio: 4 mg/g creat (ref 0–29)
Microalbumin, Urine: 3.8 ug/mL

## 2022-08-20 ENCOUNTER — Other Ambulatory Visit (HOSPITAL_COMMUNITY): Payer: Self-pay

## 2022-08-20 NOTE — Telephone Encounter (Signed)
Pharmacy Patient Advocate Encounter  Prior Authorization for Ozempic 0.25-0.'5mg'$ /dose pen has been approved.    PA# [id number] Effective dates: 08/17/2022 through 08/17/2023

## 2022-08-22 ENCOUNTER — Other Ambulatory Visit (HOSPITAL_BASED_OUTPATIENT_CLINIC_OR_DEPARTMENT_OTHER): Payer: Self-pay

## 2022-08-24 ENCOUNTER — Emergency Department (HOSPITAL_COMMUNITY)
Admission: EM | Admit: 2022-08-24 | Discharge: 2022-08-25 | Disposition: A | Discharge status: Home / Self Care | Payer: 59 | Attending: Emergency Medicine | Admitting: Emergency Medicine

## 2022-08-24 ENCOUNTER — Emergency Department (HOSPITAL_COMMUNITY): Payer: 59

## 2022-08-24 ENCOUNTER — Other Ambulatory Visit: Payer: Self-pay

## 2022-08-24 ENCOUNTER — Encounter (HOSPITAL_COMMUNITY): Payer: Self-pay | Admitting: *Deleted

## 2022-08-24 DIAGNOSIS — E876 Hypokalemia: Secondary | ICD-10-CM | POA: Insufficient documentation

## 2022-08-24 DIAGNOSIS — R109 Unspecified abdominal pain: Secondary | ICD-10-CM | POA: Diagnosis present

## 2022-08-24 DIAGNOSIS — R1084 Generalized abdominal pain: Secondary | ICD-10-CM | POA: Diagnosis not present

## 2022-08-24 LAB — CBC WITH DIFFERENTIAL/PLATELET
Abs Immature Granulocytes: 0.03 10*3/uL (ref 0.00–0.07)
Basophils Absolute: 0.1 10*3/uL (ref 0.0–0.1)
Basophils Relative: 1 %
Eosinophils Absolute: 0.1 10*3/uL (ref 0.0–0.5)
Eosinophils Relative: 1 %
HCT: 44.9 % (ref 36.0–46.0)
Hemoglobin: 15.3 g/dL — ABNORMAL HIGH (ref 12.0–15.0)
Immature Granulocytes: 0 %
Lymphocytes Relative: 32 %
Lymphs Abs: 2.5 10*3/uL (ref 0.7–4.0)
MCH: 30.5 pg (ref 26.0–34.0)
MCHC: 34.1 g/dL (ref 30.0–36.0)
MCV: 89.4 fL (ref 80.0–100.0)
Monocytes Absolute: 0.6 10*3/uL (ref 0.1–1.0)
Monocytes Relative: 8 %
Neutro Abs: 4.6 10*3/uL (ref 1.7–7.7)
Neutrophils Relative %: 58 %
Platelets: 191 10*3/uL (ref 150–400)
RBC: 5.02 MIL/uL (ref 3.87–5.11)
RDW: 12.7 % (ref 11.5–15.5)
WBC: 7.9 10*3/uL (ref 4.0–10.5)
nRBC: 0 % (ref 0.0–0.2)

## 2022-08-24 LAB — URINALYSIS, ROUTINE W REFLEX MICROSCOPIC
Bilirubin Urine: NEGATIVE
Glucose, UA: NEGATIVE mg/dL
Ketones, ur: NEGATIVE mg/dL
Nitrite: NEGATIVE
Protein, ur: NEGATIVE mg/dL
Specific Gravity, Urine: 1.013 (ref 1.005–1.030)
pH: 6 (ref 5.0–8.0)

## 2022-08-24 LAB — COMPREHENSIVE METABOLIC PANEL
ALT: 62 U/L — ABNORMAL HIGH (ref 0–44)
AST: 40 U/L (ref 15–41)
Albumin: 4.7 g/dL (ref 3.5–5.0)
Alkaline Phosphatase: 85 U/L (ref 38–126)
Anion gap: 10 (ref 5–15)
BUN: 15 mg/dL (ref 6–20)
CO2: 29 mmol/L (ref 22–32)
Calcium: 9 mg/dL (ref 8.9–10.3)
Chloride: 97 mmol/L — ABNORMAL LOW (ref 98–111)
Creatinine, Ser: 0.71 mg/dL (ref 0.44–1.00)
GFR, Estimated: >60 mL/min (ref >60)
Glucose, Bld: 116 mg/dL — ABNORMAL HIGH (ref 70–99)
Potassium: 2.8 mmol/L — ABNORMAL LOW (ref 3.5–5.1)
Sodium: 136 mmol/L (ref 135–145)
Total Bilirubin: 0.7 mg/dL (ref 0.3–1.2)
Total Protein: 8.3 g/dL — ABNORMAL HIGH (ref 6.5–8.1)

## 2022-08-24 LAB — LIPASE, BLOOD: Lipase: 34 U/L (ref 11–51)

## 2022-08-24 MED ORDER — HYDROMORPHONE HCL 1 MG/ML IJ SOLN
1.0000 mg | Freq: Once | INTRAMUSCULAR | Status: AC
  Administered 2022-08-24: 1 mg via INTRAVENOUS
  Filled 2022-08-24: qty 1

## 2022-08-24 MED ORDER — ONDANSETRON HCL 4 MG/2ML IJ SOLN
4.0000 mg | Freq: Once | INTRAMUSCULAR | Status: AC
  Administered 2022-08-24: 4 mg via INTRAVENOUS
  Filled 2022-08-24: qty 2

## 2022-08-24 MED ORDER — POTASSIUM CHLORIDE CRYS ER 20 MEQ PO TBCR
40.0000 meq | EXTENDED_RELEASE_TABLET | Freq: Once | ORAL | Status: AC
  Administered 2022-08-24: 40 meq via ORAL
  Filled 2022-08-24: qty 2

## 2022-08-24 MED ORDER — IOHEXOL 300 MG/ML  SOLN
100.0000 mL | Freq: Once | INTRAMUSCULAR | Status: AC | PRN
  Administered 2022-08-24: 100 mL via INTRAVENOUS

## 2022-08-24 NOTE — Discharge Instructions (Signed)
See your Physician for recheck on Monday.

## 2022-08-24 NOTE — ED Triage Notes (Signed)
Pt with left flank pain a week ago.  Today pain radiates around to the abdomen.  Pt with hx of kidney stones. Slight burning with urination.  Nausea since yesterday, decrease appetite since yesterday.

## 2022-08-24 NOTE — ED Notes (Signed)
Pt to CT

## 2022-08-25 MED ORDER — POTASSIUM CHLORIDE CRYS ER 10 MEQ PO TBCR: 10.0000 meq | EXTENDED_RELEASE_TABLET | Freq: Two times a day (BID) | ORAL | 0 refills | Status: DC

## 2022-08-25 MED ORDER — HYDROCODONE-ACETAMINOPHEN 5-325 MG PO TABS: 1.0000 | ORAL_TABLET | ORAL | 0 refills | Status: DC | PRN

## 2022-08-25 MED ORDER — ONDANSETRON HCL 4 MG PO TABS: 4.0000 mg | ORAL_TABLET | Freq: Every day | ORAL | 1 refills | Status: DC | PRN

## 2022-08-25 NOTE — ED Provider Notes (Signed)
[facility] EMERGENCY DEPARTMENT Provider Note   CSN: [id number] Arrival date & time: 08/24/22  1552     History  Chief Complaint  Patient presents with   Flank Pain    Ann Peterson is a 56 y.o. female.  Patient reports she began having left flank pain 1 week ago.  Patient reports the pain is similar to pain that she has had in the past with kidney stones and urinary tract infections.  Patient reports today she is having some discomfort in both sides of her lower abdomen.  Patient reports vomiting yesterday.  Patient complains of nausea today.  Patient is not having any diarrhea.  She denies any fever or chills patient denies any chest pain.  She has not had any known exposure to COVID or influenza  The history is provided by the patient. No language interpreter was used.  Flank Pain This is a new problem. The problem occurs constantly. The problem has not changed since onset.Associated symptoms include abdominal pain. Nothing aggravates the symptoms. Nothing relieves the symptoms. She has tried nothing for the symptoms.       Home Medications Prior to Admission medications   Medication Sig Start Date End Date Taking? Authorizing Provider  HYDROcodone-acetaminophen (NORCO/VICODIN) 5-325 MG tablet Take 1 tablet by mouth every 4 (four) hours as needed for moderate pain. 08/25/22 08/25/23 Yes Caryl Ada K, PA-C  ondansetron (ZOFRAN) 4 MG tablet Take 1 tablet (4 mg total) by mouth daily as needed for nausea or vomiting. 08/25/22 08/25/23 Yes Caryl Ada K, PA-C  potassium chloride (KLOR-CON M) 10 MEQ tablet Take 1 tablet (10 mEq total) by mouth 2 (two) times daily. 08/25/22  Yes Caryl Ada K, PA-C  albuterol (VENTOLIN HFA) 108 (90 Base) MCG/ACT inhaler Inhale 2 puffs into the lungs every 6 (six) hours as needed for wheezing or shortness of breath. 03/20/21   Gwenlyn Perking, FNP  amLODipine (NORVASC) 10 MG tablet Take 1 tablet (10 mg total) by mouth daily. 05/15/22   Ronnie Doss M, DO  BIOTIN PO Take 1 capsule by mouth every evening.    [provider]  blood glucose meter kit and supplies Use up to four times daily as directed. 08/15/22   Janora Norlander, DO  celecoxib (CELEBREX) 50 MG capsule Take 1 capsule (50 mg total) by mouth 2 (two) times daily as needed for pain. 05/16/22   Janora Norlander, DO  Cholecalciferol (VITAMIN D) 50 MCG (2000 UT) CAPS Take 1 capsule by mouth every evening.    [provider]  fluticasone (FLONASE) 50 MCG/ACT nasal spray Place 2 sprays into both nostrils daily. 08/15/22   Ronnie Doss M, DO  glucose blood test strip Use as instructed to check blood sugar daily. 08/15/22   Ronnie Doss M, DO  indapamide (LOZOL) 2.5 MG tablet TAKE 1 TABLET BY MOUTH EVERY DAY 05/29/22   McKenzie, Candee Furbish, MD  Lancets (FREESTYLE) lancets Test blood glucose once daily as directed. 08/15/22   Janora Norlander, DO  levothyroxine (SYNTHROID) 175 MCG tablet Take 1 tablet (175 mcg total) by mouth daily. 05/15/22   Janora Norlander, DO  magnesium oxide (MAG-OX) 400 MG tablet Take 400 mg by mouth daily.    [provider]  metoprolol succinate (TOPROL-XL) 100 MG 24 hr tablet Take 1 tablet (100 mg total) by mouth daily. With or immediately following a meal 05/15/22   Gottschalk, Leatrice Jewels M, DO  Semaglutide,0.25 or 0.5MG/DOS, (OZEMPIC, 0.25 OR 0.5 MG/DOSE,) 2 MG/3ML  SOPN Inject 0.5 mg into the skin every 7 (seven) days. 08/15/22   Janora Norlander, DO  tiZANidine (ZANAFLEX) 4 MG tablet Take 1 tablet (4 mg total) by mouth every 6 (six) hours as needed for muscle spasms. 08/15/22   Janora Norlander, DO  traZODone (DESYREL) 50 MG tablet Take 0.5-2 tablets (25-100 mg total) by mouth at bedtime as needed for sleep. 05/15/22   Janora Norlander, DO      Allergies    Pollen extract    Review of Systems   Review of Systems  Gastrointestinal:  Positive for abdominal pain.  Genitourinary:  Positive for flank pain.  All other  systems reviewed and are negative.   Physical Exam Updated Vital Signs BP 135/75 (BP Location: Right Arm)   Pulse 66   Temp 98 F (36.7 C)   Resp 16   Ht _0  (1.626 m)   Wt 90.7 kg   LMP 09/07/2016 (Exact Date)   SpO2 97%   BMI 34.33 kg/m  Physical Exam Vitals and nursing note reviewed.  Constitutional:      Appearance: She is well-developed.  HENT:     Head: Normocephalic.  Eyes:     Pupils: Pupils are equal, round, and reactive to light.  Cardiovascular:     Rate and Rhythm: Normal rate.     Pulses: Normal pulses.  Pulmonary:     Effort: Pulmonary effort is normal.  Abdominal:     General: Abdomen is flat. There is no distension.  Musculoskeletal:        General: Normal range of motion.     Cervical back: Normal range of motion.  Skin:    General: Skin is warm.  Neurological:     Mental Status: She is alert and oriented to person, place, and time.  Psychiatric:        Mood and Affect: Mood normal.     ED Results / Procedures / Treatments   Labs (all labs ordered are listed, but only abnormal results are displayed) Labs Reviewed  URINALYSIS, ROUTINE W REFLEX MICROSCOPIC - Abnormal; Notable for the following components:      Result Value   Hgb urine dipstick SMALL (*)    Leukocytes,Ua SMALL (*)    Bacteria, UA FEW (*)    All other components within normal limits  CBC WITH DIFFERENTIAL/PLATELET - Abnormal; Notable for the following components:   Hemoglobin 15.3 (*)    All other components within normal limits  COMPREHENSIVE METABOLIC PANEL - Abnormal; Notable for the following components:   Potassium 2.8 (*)    Chloride 97 (*)    Glucose, Bld 116 (*)    Total Protein 8.3 (*)    ALT 62 (*)    All other components within normal limits  LIPASE, BLOOD    EKG None  Radiology CT ABDOMEN PELVIS W CONTRAST  Result Date: 08/24/2022 CLINICAL DATA:  Abdominal pain, acute, nonlocalized EXAM: CT ABDOMEN AND PELVIS WITH CONTRAST TECHNIQUE: Multidetector CT  imaging of the abdomen and pelvis was performed using the standard protocol following bolus administration of intravenous contrast. RADIATION DOSE REDUCTION: This exam was performed according to the departmental dose-optimization program which includes automated exposure control, adjustment of the mA and/or kV according to patient size and/or use of iterative reconstruction technique. CONTRAST:  119m OMNIPAQUE IOHEXOL 300 MG/ML  SOLN COMPARISON:  None Available. FINDINGS: Lower chest: No acute abnormality. Hepatobiliary: The enlarged liver measuring up to at least 22 cm. The hepatic parenchyma is diffusely  hypodense compared to the splenic parenchyma consistent with fatty infiltration. No focal liver abnormality. Status post cholecystectomy. No biliary dilatation. Pancreas: No focal lesion. Normal pancreatic contour. No surrounding inflammatory changes. No main pancreatic ductal dilatation. Spleen: Normal in size without focal abnormality. Adrenals/Urinary Tract: No adrenal nodule bilaterally. Bilateral kidneys enhance symmetrically. 3 mm calcified stone within the left kidney. Punctate calcification of the right kidney. No hydronephrosis. No hydroureter. The urinary bladder is unremarkable. On delayed imaging, there is no urothelial wall thickening and there are no filling defects in the opacified portions of the bilateral collecting systems or ureters. Stomach/Bowel: Stomach is within normal limits. No evidence of bowel wall thickening or dilatation. Second portion of the duodenum diverticula. Few scattered colonic diverticula. Status post appendectomy. Vascular/Lymphatic: No abdominal aorta or iliac aneurysm. Moderate to severe atherosclerotic plaque of the aorta and its branches. No abdominal, pelvic, or inguinal lymphadenopathy. Reproductive: Status post hysterectomy. No adnexal masses. Other: No intraperitoneal free fluid. No intraperitoneal free gas. No organized fluid collection. Musculoskeletal: No  abdominal wall hernia or abnormality. No suspicious lytic or blastic osseous lesions. No acute displaced fracture. L5-S1 intervertebral disc space vacuum phenomenon. IMPRESSION: 1. Nonobstructive bilateral nephrolithiasis. 2. Colonic diverticulosis with no acute diverticulitis. 3. Hepatic steatosis and hepatomegaly. 4. Status post cholecystectomy, hysterectomy, hysterectomy. 5.  Aortic Atherosclerosis (ICD10-I70.0). Electronically Signed   By: Iven Finn M.D.   On: 08/24/2022 22:08    Procedures Procedures    Medications Ordered in ED Medications  ondansetron [facility]) injection 4 mg (4 mg Intravenous Given 08/24/22 2143)  HYDROmorphone (DILAUDID) injection 1 mg (1 mg Intravenous Given 08/24/22 2145)  iohexol (OMNIPAQUE) 300 MG/ML solution 100 mL (100 mLs Intravenous Contrast Given 08/24/22 2155)  potassium chloride SA (KLOR-CON M) CR tablet 40 mEq (40 mEq Oral Given 08/24/22 2340)    ED Course/ Medical Decision Making/ A&P                           Medical Decision Making Patient complains of left flank pain and lower abdominal pain.  Patient reports pain is similar to when she has had kidney stones in the past  Amount and/or Complexity of Data Reviewed Independent Historian:     Details: Patient has family here with her External Data Reviewed: notes.    Details: Primary care notes are reviewed Labs: ordered. Decision-making details documented in ED Course.    Details: And has a potassium of 2.8 Radiology: ordered and independent interpretation performed. Decision-making details documented in ED Course.    Details: CT abdomen shows bilateral nonobstructing nephrolithiasis.  Colonic diverticulosis status post hysterectomy and cholecystectomy and aortic atherosclerosis.  Risk Prescription drug management. Risk Details: Patient reevaluated her pain has improved patient is given potassium 40 mEq p.o. patient is advised of low potassium levels I will treat her with oral potassium.   Patient is advised of CT findings.  Urine culture is pending patient is advised to see her primary care physician for recheck on Monday.  Symptoms may be secondary to starting Ozempic.  Patient is advised to discuss this with her physician           Final Clinical Impression(s) / ED Diagnoses Final diagnoses:  Generalized abdominal pain  Hypokalemia    Rx / DC Orders ED Discharge Orders          Ordered    ondansetron (ZOFRAN) 4 MG tablet  Daily PRN        08/25/22 0004  HYDROcodone-acetaminophen (NORCO/VICODIN) 5-325 MG tablet  Every 4 hours PRN        08/25/22 0004    potassium chloride (KLOR-CON M) 10 MEQ tablet  2 times daily        08/25/22 0004           An After Visit Summary was printed and given to the patient.    Fransico Meadow, PA-C 08/25/22 0012    Fransico Meadow, MD 08/27/22 [phone]

## 2022-08-26 LAB — URINE CULTURE
Culture: <10000 — AB
Special Requests: NORMAL

## 2022-08-27 ENCOUNTER — Encounter: Payer: Self-pay | Admitting: Family Medicine

## 2022-08-28 ENCOUNTER — Other Ambulatory Visit: Payer: Self-pay | Admitting: Family Medicine

## 2022-08-28 ENCOUNTER — Ambulatory Visit: Payer: 59 | Admitting: Urology

## 2022-08-28 DIAGNOSIS — E876 Hypokalemia: Secondary | ICD-10-CM

## 2022-08-31 ENCOUNTER — Ambulatory Visit: Payer: 59 | Admitting: Family Medicine

## 2022-08-31 ENCOUNTER — Encounter: Payer: Self-pay | Admitting: Family Medicine

## 2022-08-31 ENCOUNTER — Telehealth: Payer: Self-pay

## 2022-08-31 VITALS — BP 131/75 | HR 60 | Temp 98.8°F | Ht 64.0 in | Wt 200.0 lb

## 2022-08-31 DIAGNOSIS — I700 Atherosclerosis of aorta: Secondary | ICD-10-CM | POA: Insufficient documentation

## 2022-08-31 DIAGNOSIS — R10A0 Flank pain, unspecified side: Secondary | ICD-10-CM

## 2022-08-31 DIAGNOSIS — R42 Dizziness and giddiness: Secondary | ICD-10-CM

## 2022-08-31 DIAGNOSIS — R002 Palpitations: Secondary | ICD-10-CM

## 2022-08-31 DIAGNOSIS — R109 Unspecified abdominal pain: Secondary | ICD-10-CM

## 2022-08-31 DIAGNOSIS — K760 Fatty (change of) liver, not elsewhere classified: Secondary | ICD-10-CM

## 2022-08-31 DIAGNOSIS — E876 Hypokalemia: Secondary | ICD-10-CM | POA: Diagnosis not present

## 2022-08-31 DIAGNOSIS — R1032 Left lower quadrant pain: Secondary | ICD-10-CM

## 2022-08-31 LAB — CBC WITH DIFFERENTIAL/PLATELET
Basophils Absolute: 0.1 10*3/uL (ref 0.0–0.2)
Basos: 1 %
EOS (ABSOLUTE): 0.1 10*3/uL (ref 0.0–0.4)
Eos: 2 %
Hematocrit: 43.7 % (ref 34.0–46.6)
Hemoglobin: 14.8 g/dL (ref 11.1–15.9)
Immature Grans (Abs): 0 10*3/uL (ref 0.0–0.1)
Immature Granulocytes: 0 %
Lymphocytes Absolute: 1.9 10*3/uL (ref 0.7–3.1)
Lymphs: 29 %
MCH: 30.6 pg (ref 26.6–33.0)
MCHC: 33.9 g/dL (ref 31.5–35.7)
MCV: 90 fL (ref 79–97)
Monocytes Absolute: 0.6 10*3/uL (ref 0.1–0.9)
Monocytes: 10 %
Neutrophils Absolute: 3.7 10*3/uL (ref 1.4–7.0)
Neutrophils: 58 %
Platelets: 187 10*3/uL (ref 150–450)
RBC: 4.84 x10E6/uL (ref 3.77–5.28)
RDW: 12.7 % (ref 11.7–15.4)
WBC: 6.4 10*3/uL (ref 3.4–10.8)

## 2022-08-31 LAB — CMP14+EGFR
ALT: 62 IU/L — ABNORMAL HIGH (ref 0–32)
AST: 40 IU/L (ref 0–40)
Albumin/Globulin Ratio: 2 (ref 1.2–2.2)
Albumin: 4.9 g/dL (ref 3.8–4.9)
Alkaline Phosphatase: 92 IU/L (ref 44–121)
BUN/Creatinine Ratio: 13 (ref 9–23)
BUN: 11 mg/dL (ref 6–24)
Bilirubin Total: 0.6 mg/dL (ref 0.0–1.2)
CO2: 31 mmol/L — ABNORMAL HIGH (ref 20–29)
Calcium: 10 mg/dL (ref 8.7–10.2)
Chloride: 97 mmol/L (ref 96–106)
Creatinine, Ser: 0.88 mg/dL (ref 0.57–1.00)
Globulin, Total: 2.5 g/dL (ref 1.5–4.5)
Glucose: 122 mg/dL — ABNORMAL HIGH (ref 70–99)
Potassium: 3.9 mmol/L (ref 3.5–5.2)
Sodium: 142 mmol/L (ref 134–144)
Total Protein: 7.4 g/dL (ref 6.0–8.5)
eGFR: 78 mL/min/{1.73_m2} (ref >59)

## 2022-08-31 LAB — MAGNESIUM: Magnesium: 2.1 mg/dL (ref 1.6–2.3)

## 2022-08-31 NOTE — Progress Notes (Signed)
Established Patient Office Visit  Subjective   Patient ID: Ann Peterson, female    DOB: 11-29-1965  Age: 56 y.o. MRN: [id number]  Chief Complaint  Patient presents with   Hospitalization Follow-up    HPI [name] is here for a ER follow up. She was seen in the ER at [facility] on 08/24/22 for left flank pain. She was also having bilateral lower abdominal pain with vomiting and nausea. She was also having diarrhea after starting ozempic. She was not having fever, chills, or chest pain. She was not having urinary symptoms. She had had kidney stones in the past and the pain felt similar to that to her. On her labs her potassium was 2.8, otherwise her CMP, CBC, and lipase were unremarkable. Her urine culture was negative. An abdominal CT scan showed nonobstructive bilateral nephrolithiasis. No hydronephrosis or hydroureters was noted. She was given 40 mEQ po of potassium and discharged with zofran and potassium 10 mEq BID.   She continues to have some pain on her left lower back that radiates to her left lower abdominal side. This is constant. She is having some nausea in the mornings, but no vomiting. She last had diarrhea 2 days ago. This occurs every other day or so since starting ozempic on 08/15/22. She has had 3 doses of ozempic with her last dose on 08/29/22. She is on the 0.25 mg dosage. She is trying to stay well hydrated. She has been drinking water as well as gatorade. She also has been waking up in the middle of the night for the last 3 nights with a racing heart rate. She deep breaths and this slows her heart rate down. This lasts from 30-60 minutes. Overall she is feeling better but still feels overall weak, a little dizzy, and feels very thirsty. She has been keep an eye on her blood sugars and they have been in the low 100s.     ROS Negative unless specially indicated above in HPI.   Objective:     BP 131/75   Pulse 60   Temp 98.8 F (37.1 C)   Ht _0  (1.626 m)   Wt 200 lb (90.7 kg)    LMP 09/07/2016 (Exact Date)   SpO2 96%   BMI 34.33 kg/m  Wt Readings from Last 3 Encounters:  08/31/22 200 lb (90.7 kg)  08/24/22 200 lb (90.7 kg)  08/15/22 209 lb 12.8 oz (95.2 kg)    Physical Exam Vitals and nursing note reviewed.  Constitutional:      General: She is not in acute distress.    Appearance: She is not ill-appearing, toxic-appearing or diaphoretic.  HENT:     Head: Normocephalic and atraumatic.     Mouth/Throat:     Mouth: Mucous membranes are moist.     Pharynx: Oropharynx is clear.  Eyes:     General: No scleral icterus.       Right eye: No discharge.        Left eye: No discharge.  Cardiovascular:     Rate and Rhythm: Normal rate and regular rhythm.     Heart sounds: Normal heart sounds. No murmur heard. Pulmonary:     Effort: Pulmonary effort is normal. No respiratory distress.     Breath sounds: Normal breath sounds.  Abdominal:     General: Bowel sounds are normal. There is no distension.     Palpations: Abdomen is soft.     Tenderness: There is no abdominal tenderness. There is no right CVA  tenderness, left CVA tenderness, guarding or rebound.  Musculoskeletal:     Cervical back: Neck supple. No rigidity.     Right lower leg: No edema.     Left lower leg: No edema.  Skin:    General: Skin is warm and dry.     Coloration: Skin is not jaundiced or pale.  Neurological:     General: No focal deficit present.     Mental Status: She is alert and oriented to person, place, and time.  Psychiatric:        Mood and Affect: Mood normal.        Behavior: Behavior normal.        Thought Content: Thought content normal.        Judgment: Judgment normal.    No results found for any visits on 08/31/22.    The 10-year ASCVD risk score (Arnett DK, et al., 2019) is: 8.9%    Assessment & Plan:   Blessings was seen today for hospitalization follow-up.  Diagnoses and all orders for this visit:  Hypokalemia Secondary to GI losses. Vomiting has now  resolved. Diarrhea every other day continues with ozempic. Currently on oral supplement. Stat labs pending. Discussed will notify of results and plan of care pending results, but did mention that will likely have to discontinue ozempic due to side effects.  -     CMP14+EGFR -     CBC with Differential/Platelet -     Magnesium  Palpitations Dizziness EKG with NSR. Negative orthostatics today. Labs pending. Discussed hydration.  -     EKG 12-Lead -     CMP14+EGFR -     CBC with Differential/Platelet   Left lower quadrant abdominal pain Flank pain Improving. Reviewed CT scan from ER, no acute finding. Discussed likely side effects from ozempic and will likely have to discontinue ozempic due to severity of side effects at starting dose.   Hepatic steatosis Noted on CT. Discussed with patient. Discussed diet, exercise, weight loss.   Aortic atherosclerosis ([place]) Noted on CT. She will start a daily aspirin.     Return if symptoms worsen or fail to improve. Will determine follow up pending lab results.     Gwenlyn Perking, FNP

## 2022-08-31 NOTE — Patient Instructions (Signed)
Hypokalemia Hypokalemia means that the amount of potassium in the blood is lower than normal. Potassium is a mineral (electrolyte) that helps regulate the amount of fluid in the body. It also stimulates muscle tightening (contraction) and helps nerves work properly. Normally, most of the body's potassium is inside cells, and only a very small amount is in the blood. Because the amount in the blood is so small, minor changes to potassium levels in the blood can be life-threatening. What are the causes? This condition may be caused by: Antibiotic medicine. Diarrhea or vomiting. Taking too much of a medicine that helps you have a bowel movement (laxative) can cause diarrhea and lead to hypokalemia. Chronic kidney disease (CKD). Medicines that help the body get rid of excess fluid (diuretics). Eating disorders, such as anorexia or bulimia. Low magnesium levels in the body. Sweating a lot. What are the signs or symptoms? Symptoms of this condition include: Weakness. Constipation. Fatigue. Muscle cramps. Mental confusion. Skipped heartbeats or irregular heartbeat (palpitations). Tingling or numbness. How is this diagnosed? This condition is diagnosed with a blood test. How is this treated? This condition may be treated by: Taking potassium supplements. Adjusting the medicines that you take. Eating more foods that contain a lot of potassium. If your potassium level is very low, you may need to get potassium through an IV and be monitored in the hospital. Follow these instructions at home: Eating and drinking  Eat a healthy diet. A healthy diet includes fresh fruits and vegetables, whole grains, healthy fats, and lean proteins. If told, eat more foods that contain a lot of potassium. These include: Nuts, such as peanuts and pistachios. Seeds, such as sunflower seeds and pumpkin seeds. Peas, lentils, and lima beans. Whole grain and bran cereals and breads. Fresh fruits and vegetables,  such as apricots, avocado, bananas, cantaloupe, kiwi, oranges, tomatoes, asparagus, and potatoes. Juices, such as orange, tomato, and prune. Lean meats, including fish. Milk and milk products, such as yogurt. General instructions Take over-the-counter and prescription medicines only as told by your health care provider. This includes vitamins, natural food products, and supplements. Keep all follow-up visits. This is important. Contact a health care provider if: You have weakness that gets worse. You feel your heart pounding or racing. You vomit. You have diarrhea. You have diabetes and you have trouble keeping your blood sugar in your target range. Get help right away if: You have chest pain. You have shortness of breath. You have vomiting or diarrhea that lasts for more than 2 days. You faint. These symptoms may be an emergency. Get help right away. Call 911. Do not wait to see if the symptoms will go away. Do not drive yourself to the hospital. Summary Hypokalemia means that the amount of potassium in the blood is lower than normal. This condition is diagnosed with a blood test. Hypokalemia may be treated by taking potassium supplements, adjusting the medicines that you take, or eating more foods that are high in potassium. If your potassium level is very low, you may need to get potassium through an IV and be monitored in the hospital. This information is not intended to replace advice given to you by your health care provider. Make sure you discuss any questions you have with your health care provider. Document Revised: 05/11/2021 Document Reviewed: 05/11/2021 Elsevier Patient Education  2023 [employer]  

## 2022-08-31 NOTE — Telephone Encounter (Signed)
Stat lab came back - came back as 3.9   Per Tiffany - She can stop taking supplement and make sure she is eating kt in her diet   Discussed with Dr Darnell Level - stop ozempic as symptoms are likely side effects of it  Please f/u in 2 weeks with pcp or me to discus medications

## 2022-09-11 ENCOUNTER — Encounter (INDEPENDENT_AMBULATORY_CARE_PROVIDER_SITE_OTHER): Payer: Commercial Managed Care - PPO | Admitting: Family Medicine

## 2022-09-11 DIAGNOSIS — E119 Type 2 diabetes mellitus without complications: Secondary | ICD-10-CM

## 2022-09-14 ENCOUNTER — Other Ambulatory Visit (HOSPITAL_BASED_OUTPATIENT_CLINIC_OR_DEPARTMENT_OTHER): Payer: Self-pay

## 2022-09-14 MED ORDER — TRULICITY 0.75 MG/0.5ML ~~LOC~~ SOAJ
0.7500 mg | SUBCUTANEOUS | 0 refills | Status: DC
  Filled 2022-09-14: qty 2, 28d supply, fill #0
  Filled 2022-09-28 – 2022-10-08 (×4): qty 2, 28d supply, fill #1
  Filled 2022-10-25 – 2022-11-01 (×2): qty 2, 28d supply, fill #2

## 2022-09-14 NOTE — Telephone Encounter (Signed)

## 2022-09-26 ENCOUNTER — Encounter (HOSPITAL_BASED_OUTPATIENT_CLINIC_OR_DEPARTMENT_OTHER): Payer: Self-pay | Admitting: Emergency Medicine

## 2022-09-26 ENCOUNTER — Other Ambulatory Visit (HOSPITAL_BASED_OUTPATIENT_CLINIC_OR_DEPARTMENT_OTHER): Payer: Self-pay

## 2022-09-26 ENCOUNTER — Emergency Department (HOSPITAL_BASED_OUTPATIENT_CLINIC_OR_DEPARTMENT_OTHER): Payer: Commercial Managed Care - PPO | Admitting: Radiology

## 2022-09-26 ENCOUNTER — Emergency Department (HOSPITAL_BASED_OUTPATIENT_CLINIC_OR_DEPARTMENT_OTHER)
Admission: EM | Admit: 2022-09-26 | Discharge: 2022-09-26 | Disposition: A | Discharge status: Home / Self Care | Payer: Commercial Managed Care - PPO | Attending: Emergency Medicine | Admitting: Emergency Medicine

## 2022-09-26 ENCOUNTER — Other Ambulatory Visit: Payer: Self-pay

## 2022-09-26 DIAGNOSIS — I10 Essential (primary) hypertension: Secondary | ICD-10-CM | POA: Insufficient documentation

## 2022-09-26 DIAGNOSIS — J069 Acute upper respiratory infection, unspecified: Secondary | ICD-10-CM | POA: Diagnosis not present

## 2022-09-26 DIAGNOSIS — J988 Other specified respiratory disorders: Secondary | ICD-10-CM | POA: Diagnosis not present

## 2022-09-26 DIAGNOSIS — E039 Hypothyroidism, unspecified: Secondary | ICD-10-CM | POA: Diagnosis not present

## 2022-09-26 DIAGNOSIS — R059 Cough, unspecified: Secondary | ICD-10-CM | POA: Diagnosis not present

## 2022-09-26 DIAGNOSIS — E119 Type 2 diabetes mellitus without complications: Secondary | ICD-10-CM | POA: Insufficient documentation

## 2022-09-26 DIAGNOSIS — B974 Respiratory syncytial virus as the cause of diseases classified elsewhere: Secondary | ICD-10-CM | POA: Diagnosis not present

## 2022-09-26 DIAGNOSIS — Z794 Long term (current) use of insulin: Secondary | ICD-10-CM | POA: Insufficient documentation

## 2022-09-26 DIAGNOSIS — Z79899 Other long term (current) drug therapy: Secondary | ICD-10-CM | POA: Diagnosis not present

## 2022-09-26 DIAGNOSIS — Z20822 Contact with and (suspected) exposure to covid-19: Secondary | ICD-10-CM | POA: Insufficient documentation

## 2022-09-26 DIAGNOSIS — B338 Other specified viral diseases: Secondary | ICD-10-CM

## 2022-09-26 LAB — RESP PANEL BY RT-PCR (RSV, FLU A&B, COVID)  RVPGX2
Influenza A by PCR: NEGATIVE
Influenza B by PCR: NEGATIVE
Resp Syncytial Virus by PCR: POSITIVE — AB
SARS Coronavirus 2 by RT PCR: NEGATIVE

## 2022-09-26 MED ORDER — BENZONATATE 100 MG PO CAPS
100.0000 mg | ORAL_CAPSULE | Freq: Three times a day (TID) | ORAL | 0 refills | Status: DC | PRN
  Filled 2022-09-26: qty 21, 7d supply, fill #0

## 2022-09-26 NOTE — ED Triage Notes (Signed)
Pt arrives to ED with c/o cough, body aches, congestion that started yesterday. Family member with RSV dx.

## 2022-09-26 NOTE — ED Notes (Signed)
Patient verbalizes understanding of discharge instructions. Opportunity for questioning and answers were provided. Patient discharged from ED.

## 2022-09-26 NOTE — Discharge Instructions (Addendum)
Note the workup today was overall reassuring.  As discussed, symptoms is likely secondary to**.  Recommend symptomatic therapy at home with daily allergy medicine in the form of Claritin/Zyrtec/Allegra, nasal steroid spray in the form of Nasacort/Flonase, Tylenol/ibuprofen as needed for fever/pain.  I will also prescribe a cough suppressant to use as needed every 8 hours.  Please do not hesitate to return to emergency department for worrisome signs and symptoms we discussed become apparent.

## 2022-09-26 NOTE — ED Provider Notes (Signed)
[facility] DE[facility] Provider Note   CSN: [id number] Arrival date & time: 09/26/22  0831     History  Chief Complaint  Patient presents with   URI    Ann Peterson is a 57 y.o. female.   URI   57 year old female presents emergency department with complaints of nasal congestion, cough, sore throat.  Patient states that symptoms began last night.  States known exposure to RSV via 2 family members who tested positive over the past several days.  Has been taking at home NyQuil/DayQuil which has helped symptoms some.  Was sent here by work for further assessment due to current illness.  Denies fever, chills, night sweats, nausea, vomiting, chest pain, shortness of breath, abdominal pain, urinary/vaginal symptoms.  She does state that she has had diarrhea this morning nonbloody or melanotic in appearance.  Past medical history significant for hypertension, hyperlipidemia, OSA, hypothyroidism, diabetes mellitus type 2  Home Medications Prior to Admission medications   Medication Sig Start Date End Date Taking? Authorizing Provider  benzonatate (TESSALON) 100 MG capsule Take 1 capsule (100 mg total) by mouth 3 (three) times daily as needed for cough. 09/26/22  Yes Dion Saucier A, PA  albuterol (VENTOLIN HFA) 108 (90 Base) MCG/ACT inhaler Inhale 2 puffs into the lungs every 6 (six) hours as needed for wheezing or shortness of breath. 03/20/21   Gwenlyn Perking, FNP  amLODipine (NORVASC) 10 MG tablet Take 1 tablet (10 mg total) by mouth daily. 05/15/22   Ronnie Doss M, DO  BIOTIN PO Take 1 capsule by mouth every evening.    [provider]  blood glucose meter kit and supplies Use up to four times daily as directed. 08/15/22   Janora Norlander, DO  celecoxib (CELEBREX) 50 MG capsule Take 1 capsule (50 mg total) by mouth 2 (two) times daily as needed for pain. 05/16/22   Janora Norlander, DO  Cholecalciferol (VITAMIN D) 50 MCG (2000 UT) CAPS Take 1 capsule  by mouth every evening.    [provider]  Dulaglutide (TRULICITY) 4.74 QV/9.5GL SOPN Inject 0.75 mg into the skin once a week. 09/14/22   Janora Norlander, DO  fluticasone (FLONASE) 50 MCG/ACT nasal spray Place 2 sprays into both nostrils daily. 08/15/22   Ronnie Doss M, DO  glucose blood test strip Use as instructed to check blood sugar daily. 08/15/22   Janora Norlander, DO  HYDROcodone-acetaminophen (NORCO/VICODIN) 5-325 MG tablet Take 1 tablet by mouth every 4 (four) hours as needed for moderate pain. 08/25/22 08/25/23  Fransico Meadow, PA-C  indapamide (LOZOL) 2.5 MG tablet TAKE 1 TABLET BY MOUTH EVERY DAY 05/29/22   McKenzie, Candee Furbish, MD  Lancets (FREESTYLE) lancets Test blood glucose once daily as directed. 08/15/22   Janora Norlander, DO  levothyroxine (SYNTHROID) 175 MCG tablet Take 1 tablet (175 mcg total) by mouth daily. 05/15/22   Janora Norlander, DO  magnesium oxide (MAG-OX) 400 MG tablet Take 400 mg by mouth daily.    [provider]  metoprolol succinate (TOPROL-XL) 100 MG 24 hr tablet Take 1 tablet (100 mg total) by mouth daily. With or immediately following a meal 05/15/22   Ronnie Doss M, DO  ondansetron (ZOFRAN) 4 MG tablet Take 1 tablet (4 mg total) by mouth daily as needed for nausea or vomiting. 08/25/22 08/25/23  Fransico Meadow, PA-C  potassium chloride (KLOR-CON M) 10 MEQ tablet Take 1 tablet (10 mEq total) by mouth 2 (two) times daily. 08/25/22  Caryl Ada K, PA-C  tiZANidine (ZANAFLEX) 4 MG tablet Take 1 tablet (4 mg total) by mouth every 6 (six) hours as needed for muscle spasms. 08/15/22   Janora Norlander, DO  traZODone (DESYREL) 50 MG tablet Take 0.5-2 tablets (25-100 mg total) by mouth at bedtime as needed for sleep. 05/15/22   Janora Norlander, DO      Allergies    Pollen extract    Review of Systems   Review of Systems  All other systems reviewed and are negative.   Physical Exam Updated Vital Signs BP 138/84 (BP  Location: Right Arm)   Pulse 76   Temp 98 F (36.7 C)   Resp 18   Ht '5\' 3"'$  (1.6 m)   Wt 86.2 kg   LMP 09/07/2016 (Exact Date)   SpO2 100%   BMI 33.66 kg/m  Physical Exam Vitals and nursing note reviewed.  Constitutional:      General: She is not in acute distress.    Appearance: She is well-developed.  HENT:     Head: Normocephalic and atraumatic.     Nose: Congestion and rhinorrhea present.     Mouth/Throat:     Pharynx: Posterior oropharyngeal erythema present.     Comments: Mild posterior pharyngeal erythema.  Uvula midline rise symmetrical phonation.  Tonsils are 1+ bilaterally with no obvious exudate.  No sublingual or submandibular swelling appreciated. Eyes:     Conjunctiva/sclera: Conjunctivae normal.  Cardiovascular:     Rate and Rhythm: Normal rate and regular rhythm.     Heart sounds: No murmur heard. Pulmonary:     Effort: Pulmonary effort is normal. No respiratory distress.     Breath sounds: Normal breath sounds. No stridor. No wheezing, rhonchi or rales.  Abdominal:     Palpations: Abdomen is soft.     Tenderness: There is no abdominal tenderness. There is no guarding.  Musculoskeletal:        General: No swelling.     Cervical back: Neck supple.  Skin:    General: Skin is warm and dry.     Capillary Refill: Capillary refill takes less than 2 seconds.  Neurological:     Mental Status: She is alert.  Psychiatric:        Mood and Affect: Mood normal.     ED Results / Procedures / Treatments   Labs (all labs ordered are listed, but only abnormal results are displayed) Labs Reviewed  RESP PANEL BY RT-PCR (RSV, FLU A&B, COVID)  RVPGX2 - Abnormal; Notable for the following components:      Result Value   Resp Syncytial Virus by PCR POSITIVE (*)    All other components within normal limits    EKG None  Radiology DG Chest 2 View  Result Date: 09/26/2022 CLINICAL DATA:  Cough. EXAM: CHEST - 2 VIEW COMPARISON:  None Available. FINDINGS: The heart  size and mediastinal contours are within normal limits. Both lungs are clear. Mild thoracic dextroscoliosis and spondylosis. Surgical clips in the thyroid bed, likely secondary to prior thyroidectomy IMPRESSION: No active cardiopulmonary disease. Electronically Signed   By: Keane Police D.O.   On: 09/26/2022 09:41    Procedures Procedures    Medications Ordered in ED Medications - No data to display  ED Course/ Medical Decision Making/ A&P                             Medical Decision Making Amount and/or Complexity  of Data Reviewed Radiology: ordered.   This patient presents to the ED for concern of flulike illness, this involves an extensive number of treatment options, and is a complaint that carries with it a high risk of complications and morbidity.  The differential diagnosis includes RSV, COVID, influenza, pneumonia, sepsis, meningitis   Co morbidities that complicate the patient evaluation  See HPI   Additional history obtained:  Additional history obtained from EMR External records from outside source obtained and reviewed including hospital records   Lab Tests:  I Ordered, and personally interpreted labs.  The pertinent results include: Respiratory viral panel positive for RSV   Imaging Studies ordered:  I ordered imaging studies including chest x-ray I independently visualized and interpreted imaging which showed no acute cardiopulmonary process. I agree with the radiologist interpretation   Cardiac Monitoring: / EKG:  The patient was maintained on a cardiac monitor.  I personally viewed and interpreted the cardiac monitored which showed an underlying rhythm of: Sinus rhythm   Consultations Obtained:  N/a   Problem List / ED Course / Critical interventions / Medication management  RSV Reevaluation of the patient that the patient stayed the same I have reviewed the patients home medicines and have made adjustments as needed   Social Determinants  of Health:  Denies tobacco, illicit drug use   Test / Admission - Considered:  RSV Vitals signs  within normal range and stable throughout visit. Laboratory/imaging studies significant for: See above Patient symptoms most likely secondary to RSV.  Patient afebrile in no acute respiratory distress with no evidence of pneumonia at this time.  Symptomatic therapy to be recommended with antihistamine, nasal steroid spray, cough suppressant as needed, Tylenol/Motrin as needed for pain/fever.  Recommend follow-up with primary care for reassessment.  Treatment plan discussed at length with patient and she acknowledged understanding was agreeable to said plan. Worrisome signs and symptoms were discussed with the patient, and the patient acknowledged understanding to return to the ED if noticed. Patient was stable upon discharge.          Final Clinical Impression(s) / ED Diagnoses Final diagnoses:  RSV (respiratory syncytial virus infection)    Rx / DC Orders ED Discharge Orders          Ordered    benzonatate (TESSALON) 100 MG capsule  3 times daily PRN        09/26/22 0913              Wilnette Kales, PA 09/26/22 0946    Kemper Durie, DO 09/26/22 1051

## 2022-09-29 ENCOUNTER — Other Ambulatory Visit (HOSPITAL_BASED_OUTPATIENT_CLINIC_OR_DEPARTMENT_OTHER): Payer: Self-pay

## 2022-10-02 ENCOUNTER — Encounter (HOSPITAL_BASED_OUTPATIENT_CLINIC_OR_DEPARTMENT_OTHER): Payer: Self-pay | Admitting: Pharmacist

## 2022-10-02 ENCOUNTER — Other Ambulatory Visit (HOSPITAL_BASED_OUTPATIENT_CLINIC_OR_DEPARTMENT_OTHER): Payer: Self-pay

## 2022-10-08 ENCOUNTER — Other Ambulatory Visit (HOSPITAL_BASED_OUTPATIENT_CLINIC_OR_DEPARTMENT_OTHER): Payer: Self-pay

## 2022-10-25 ENCOUNTER — Other Ambulatory Visit: Payer: Self-pay | Admitting: Family Medicine

## 2022-10-25 DIAGNOSIS — M62830 Muscle spasm of back: Secondary | ICD-10-CM

## 2022-10-25 MED FILL — Indapamide Tab 2.5 MG: ORAL | 90 days supply | Qty: 90 | Fill #1 | Status: CN

## 2022-10-26 ENCOUNTER — Other Ambulatory Visit: Payer: Self-pay | Admitting: Family Medicine

## 2022-10-26 ENCOUNTER — Other Ambulatory Visit (HOSPITAL_COMMUNITY): Payer: Self-pay

## 2022-10-26 ENCOUNTER — Other Ambulatory Visit: Payer: Self-pay

## 2022-10-26 ENCOUNTER — Encounter (HOSPITAL_COMMUNITY): Payer: Self-pay

## 2022-10-26 DIAGNOSIS — M159 Polyosteoarthritis, unspecified: Secondary | ICD-10-CM

## 2022-10-26 DIAGNOSIS — M150 Primary generalized (osteo)arthritis: Secondary | ICD-10-CM

## 2022-10-26 MED ORDER — CELECOXIB 50 MG PO CAPS
50.0000 mg | ORAL_CAPSULE | Freq: Two times a day (BID) | ORAL | 0 refills | Status: DC | PRN
  Filled 2022-10-26: qty 60, 30d supply, fill #0

## 2022-10-26 MED ORDER — TIZANIDINE HCL 4 MG PO TABS
4.0000 mg | ORAL_TABLET | Freq: Four times a day (QID) | ORAL | 1 refills | Status: DC | PRN
  Filled 2022-10-26 (×2): qty 30, 8d supply, fill #0
  Filled 2022-12-10: qty 30, 8d supply, fill #1

## 2022-10-29 ENCOUNTER — Other Ambulatory Visit (HOSPITAL_COMMUNITY): Payer: Self-pay

## 2022-10-29 ENCOUNTER — Other Ambulatory Visit: Payer: Self-pay

## 2022-10-30 ENCOUNTER — Other Ambulatory Visit: Payer: Self-pay

## 2022-10-30 ENCOUNTER — Other Ambulatory Visit (HOSPITAL_COMMUNITY): Payer: Self-pay

## 2022-10-31 ENCOUNTER — Other Ambulatory Visit (HOSPITAL_BASED_OUTPATIENT_CLINIC_OR_DEPARTMENT_OTHER): Payer: Self-pay

## 2022-10-31 DIAGNOSIS — M5126 Other intervertebral disc displacement, lumbar region: Secondary | ICD-10-CM | POA: Diagnosis not present

## 2022-10-31 DIAGNOSIS — M5416 Radiculopathy, lumbar region: Secondary | ICD-10-CM | POA: Diagnosis not present

## 2022-10-31 DIAGNOSIS — M47816 Spondylosis without myelopathy or radiculopathy, lumbar region: Secondary | ICD-10-CM | POA: Diagnosis not present

## 2022-10-31 MED ORDER — HYDROCODONE-ACETAMINOPHEN 5-325 MG PO TABS
1.0000 | ORAL_TABLET | Freq: Four times a day (QID) | ORAL | 0 refills | Status: DC | PRN
  Filled 2022-10-31: qty 20, 5d supply, fill #0

## 2022-11-01 ENCOUNTER — Other Ambulatory Visit (HOSPITAL_COMMUNITY): Payer: Self-pay

## 2022-11-01 ENCOUNTER — Other Ambulatory Visit (HOSPITAL_BASED_OUTPATIENT_CLINIC_OR_DEPARTMENT_OTHER): Payer: Self-pay

## 2022-11-01 ENCOUNTER — Other Ambulatory Visit: Payer: Self-pay

## 2022-11-01 ENCOUNTER — Encounter (HOSPITAL_COMMUNITY): Payer: Self-pay

## 2022-11-02 ENCOUNTER — Other Ambulatory Visit (HOSPITAL_BASED_OUTPATIENT_CLINIC_OR_DEPARTMENT_OTHER): Payer: Self-pay

## 2022-11-02 ENCOUNTER — Other Ambulatory Visit: Payer: Self-pay

## 2022-11-02 MED ORDER — GABAPENTIN 300 MG PO CAPS
300.0000 mg | ORAL_CAPSULE | Freq: Three times a day (TID) | ORAL | 0 refills | Status: DC
  Filled 2022-11-02: qty 90, 30d supply, fill #0

## 2022-11-06 MED FILL — Indapamide Tab 2.5 MG: ORAL | 90 days supply | Qty: 90 | Fill #1 | Status: AC

## 2022-11-07 ENCOUNTER — Other Ambulatory Visit (HOSPITAL_BASED_OUTPATIENT_CLINIC_OR_DEPARTMENT_OTHER): Payer: Self-pay

## 2022-11-07 ENCOUNTER — Encounter: Payer: Self-pay | Admitting: Family Medicine

## 2022-11-07 ENCOUNTER — Other Ambulatory Visit: Payer: Self-pay

## 2022-11-07 DIAGNOSIS — E119 Type 2 diabetes mellitus without complications: Secondary | ICD-10-CM

## 2022-11-08 ENCOUNTER — Other Ambulatory Visit: Payer: Self-pay

## 2022-11-08 ENCOUNTER — Emergency Department (HOSPITAL_BASED_OUTPATIENT_CLINIC_OR_DEPARTMENT_OTHER)
Admission: EM | Admit: 2022-11-08 | Discharge: 2022-11-08 | Disposition: A | Discharge status: Home / Self Care | Payer: Commercial Managed Care - PPO | Attending: Emergency Medicine | Admitting: Emergency Medicine

## 2022-11-08 ENCOUNTER — Encounter (HOSPITAL_BASED_OUTPATIENT_CLINIC_OR_DEPARTMENT_OTHER): Payer: Self-pay | Admitting: Emergency Medicine

## 2022-11-08 DIAGNOSIS — R300 Dysuria: Secondary | ICD-10-CM | POA: Diagnosis not present

## 2022-11-08 DIAGNOSIS — Z79890 Hormone replacement therapy (postmenopausal): Secondary | ICD-10-CM | POA: Insufficient documentation

## 2022-11-08 DIAGNOSIS — I10 Essential (primary) hypertension: Secondary | ICD-10-CM | POA: Insufficient documentation

## 2022-11-08 DIAGNOSIS — Z85850 Personal history of malignant neoplasm of thyroid: Secondary | ICD-10-CM | POA: Diagnosis not present

## 2022-11-08 DIAGNOSIS — E039 Hypothyroidism, unspecified: Secondary | ICD-10-CM | POA: Diagnosis not present

## 2022-11-08 DIAGNOSIS — E119 Type 2 diabetes mellitus without complications: Secondary | ICD-10-CM | POA: Diagnosis not present

## 2022-11-08 DIAGNOSIS — Z79899 Other long term (current) drug therapy: Secondary | ICD-10-CM | POA: Insufficient documentation

## 2022-11-08 LAB — URINALYSIS, ROUTINE W REFLEX MICROSCOPIC
Bilirubin Urine: NEGATIVE
Glucose, UA: NEGATIVE mg/dL
Hgb urine dipstick: NEGATIVE
Ketones, ur: NEGATIVE mg/dL
Leukocytes,Ua: NEGATIVE
Nitrite: NEGATIVE
Protein, ur: NEGATIVE mg/dL
Specific Gravity, Urine: 1.007 (ref 1.005–1.030)
pH: 7.5 (ref 5.0–8.0)

## 2022-11-08 MED ORDER — FLUCONAZOLE 200 MG PO TABS: 200.0000 mg | ORAL_TABLET | Freq: Every day | ORAL | 0 refills | Status: AC

## 2022-11-08 NOTE — ED Provider Notes (Signed)
[facility] Provider Note   CSN: [id number] Arrival date & time: 11/08/22  1632     History  Chief Complaint  Patient presents with   Dysuria    Ann Peterson is a 57 y.o. female with Hx of ADD, hypothyroidism, renal calculi, HTN, DMT2, hyperlipidemia, thyroid malignancy presenting to the ED due to possible UTI.  Patient reports dysuria over the last 4 to 5 days.  Used to have frequent UTIs/yeast infections, has not had 1 in the last few months.  Was usually prescribed Macrobid for frequent UTIs, which was successful in the past.  Still had some leftover Macrobid, has been taking the Macrobid since Tuesday morning.  Thought symptoms would be gone by now, still notes some mild dysuria.      Denies any possibility of STD exposure, no new/recent sexual partners.  No significant vaginal discharge or bleeding.  Denies abdominal pain, shortness of breath, flank pain, fevers, chills, hematuria, urinary retention, or difficulty with urination.  Patient admits she returned to wearing pantyhose over the last 2-3 weeks, which she recalls in the past had been associated with frequent UTIs and yeast infections.  Hx of abdominal hysterectomy.  Ovaries remain.  The history is provided by the patient and medical records.      Home Medications Prior to Admission medications   Medication Sig Start Date End Date Taking? Authorizing Provider  fluconazole (DIFLUCAN) 200 MG tablet Take 1 tablet (200 mg total) by mouth daily for 2 days. Take 1 tablet with plenty of food and water, 1 time.  If symptoms persist, may take the second pill 72 hours after the first. 11/08/22 11/10/22 Yes Prince Rome, PA-C  albuterol (VENTOLIN HFA) 108 (90 Base) MCG/ACT inhaler Inhale 2 puffs into the lungs every 6 (six) hours as needed for wheezing or shortness of breath. 03/20/21   Gwenlyn Perking, FNP  amLODipine (NORVASC) 10 MG tablet Take 1 tablet (10 mg total) by mouth  daily. 05/15/22   Janora Norlander, DO  benzonatate (TESSALON) 100 MG capsule Take 1 capsule (100 mg total) by mouth 3 (three) times daily as needed for cough. 09/26/22   Dion Saucier A, PA  BIOTIN PO Take 1 capsule by mouth every evening.    [provider]  blood glucose meter kit and supplies Use up to four times daily as directed. 08/15/22   Janora Norlander, DO  celecoxib (CELEBREX) 50 MG capsule Take 1 capsule (50 mg total) by mouth 2 (two) times daily as needed for pain. 10/26/22   Janora Norlander, DO  Cholecalciferol (VITAMIN D) 50 MCG (2000 UT) CAPS Take 1 capsule by mouth every evening.    [provider]  Dulaglutide (TRULICITY) A999333 [id number] SOPN Inject 0.75 mg into the skin once a week. 09/14/22   Janora Norlander, DO  fluticasone (FLONASE) 50 MCG/ACT nasal spray Place 2 sprays into both nostrils daily. 08/15/22   Janora Norlander, DO  gabapentin (NEURONTIN) 300 MG capsule Take 1 capsule (300 mg total) by mouth 3 (three) times daily. 11/02/22     glucose blood test strip Use as instructed to check blood sugar daily. 08/15/22   Janora Norlander, DO  HYDROcodone-acetaminophen (NORCO/VICODIN) 5-325 MG tablet Take 1 tablet by mouth every 4 (four) hours as needed for moderate pain. 08/25/22 08/25/23  Fransico Meadow, PA-C  HYDROcodone-acetaminophen (NORCO/VICODIN) 5-325 MG tablet Take 1 tablet by mouth every 6 (six) hours as needed for pain 10/31/22  indapamide (LOZOL) 2.5 MG tablet TAKE 1 TABLET BY MOUTH EVERY DAY 05/29/22   McKenzie, Candee Furbish, MD  Lancets (FREESTYLE) lancets Test blood glucose once daily as directed. 08/15/22   Janora Norlander, DO  levothyroxine (SYNTHROID) 175 MCG tablet Take 1 tablet (175 mcg total) by mouth daily. 05/15/22   Janora Norlander, DO  magnesium oxide (MAG-OX) 400 MG tablet Take 400 mg by mouth daily.    [provider]  metoprolol succinate (TOPROL-XL) 100 MG 24 hr tablet Take 1 tablet (100 mg total) by mouth  daily. With or immediately following a meal 05/15/22   Ronnie Doss M, DO  ondansetron (ZOFRAN) 4 MG tablet Take 1 tablet (4 mg total) by mouth daily as needed for nausea or vomiting. 08/25/22 08/25/23  Fransico Meadow, PA-C  potassium chloride (KLOR-CON M) 10 MEQ tablet Take 1 tablet (10 mEq total) by mouth 2 (two) times daily. 08/25/22   Fransico Meadow, PA-C  tiZANidine (ZANAFLEX) 4 MG tablet Take 1 tablet (4 mg total) by mouth every 6 (six) hours as needed for muscle spasms. 10/26/22   Janora Norlander, DO  traZODone (DESYREL) 50 MG tablet Take 0.5-2 tablets (25-100 mg total) by mouth at bedtime as needed for sleep. 05/15/22   Janora Norlander, DO      Allergies    Pollen extract    Review of Systems   Review of Systems  Genitourinary:  Positive for dysuria.    Physical Exam Updated Vital Signs BP (!) 143/85 (BP Location: Right Arm)   Pulse 69   Temp 98.4 F (36.9 C)   Resp 20   LMP 09/07/2016 (Exact Date)   SpO2 94%  Physical Exam Vitals and nursing note reviewed. Exam conducted with a chaperone present.  Constitutional:      General: She is not in acute distress.    Appearance: She is well-developed. She is not ill-appearing, toxic-appearing or diaphoretic.     Comments: Sitting comfortably  HENT:     Head: Normocephalic and atraumatic.  Eyes:     Conjunctiva/sclera: Conjunctivae normal.  Cardiovascular:     Rate and Rhythm: Normal rate and regular rhythm.     Heart sounds: No murmur heard. Pulmonary:     Effort: Pulmonary effort is normal. No respiratory distress.     Breath sounds: Normal breath sounds.  Abdominal:     General: There is no distension.     Palpations: Abdomen is soft.     Tenderness: There is no abdominal tenderness. There is no right CVA tenderness, left CVA tenderness, guarding or rebound. Negative signs include Murphy's sign and McBurney's sign.  Genitourinary:    Exam position: Lithotomy position.     Comments: Chaperone present.  Mild  pallor and dryness of vaginal mucosa.  No vaginal bleeding appreciated.  Mild scant white thick discharge of the labial folds.  No urethral abnormalities.  No rash or lesions. Musculoskeletal:        General: No swelling.     Cervical back: Neck supple.  Skin:    General: Skin is warm and dry.     Capillary Refill: Capillary refill takes less than 2 seconds.  Neurological:     Mental Status: She is alert.  Psychiatric:        Mood and Affect: Mood normal.     ED Results / Procedures / Treatments   Labs (all labs ordered are listed, but only abnormal results are displayed) Labs Reviewed  URINALYSIS, Lee  MICROSCOPIC    EKG None  Radiology No results found.  Procedures Procedures    Medications Ordered in ED Medications - No data to display  ED Course/ Medical Decision Making/ A&P                             Medical Decision Making  57 y.o. female presents to the ED for concern of Dysuria   This involves an extensive number of treatment options, and is a complaint that carries with it a high risk of complications and morbidity.  The emergent differential diagnosis prior to evaluation includes, but is not limited to: UTI, acute cystitis, vulvovaginitis,   This is not an exhaustive differential.   Past Medical History / Co-morbidities / Social History: Hx of ADD, hypothyroidism, renal calculi, HTN, DMT2, hyperlipidemia, malignancy Social Determinants of Health include: None  Additional History:  Obtained by chart review.  Notably recent ED visits and   Lab Tests: I ordered, and personally interpreted labs.  The pertinent results include:   UA unremarkable  Imaging Studies: None  ED Course: Pt well-appearing on exam.  Presenting with dysuria.  No abdominal pain, fever, flank pain, urinary retention, difficulty with urination, or changes in bowel habits.  Hx of recurrent UTIs and yeast infections.  He is to follow closely with urology, appears to be  lost to follow-up.  Was prescribed Macrobid often for UTIs, still had some leftover, started this on Tuesday.  Still with some lingering dysuria.  Admits to increased UTIs and yeast infections with use of pantyhose, restarted wearing pantyhose about 2 to 3 weeks ago.  Has not noticed copious discharge.  Denies vaginal bleeding or symptoms. Physical exam suggestive of possible early yeast infection and with evidence of vaginal mucosal dryness and mild paling.  May be early signs of vaginal atrophy considering age and history of hysterectomy.  History, clinical exam, and workup provides low suspicion for acute cystitis, pyelonephritis, renal calculi, PUD. Shared decision making with patient.  Difficult to discern whether antibiotics is proving successful with today's UA results, or if pt without UTI.  Pt elected to proceed with empiric treatment for Candida vulvovaginitis and with continuation of Macrobid until Saturday for possible UTI.  Also strongly encouraged refollowing up with her urologist Dr. Alyson Ingles early next week, and with OB/GYN to discuss possible vaginal atrophy.  OB/GYN resources provided.  Strict return precautions discussed.  Patient reports satisfaction with today's encounter.  Patient in NAD and in good condition at time of discharge.  Disposition: After consideration the patient's encounter today, I do not feel today's workup suggests an emergent condition requiring admission or immediate intervention beyond what has been performed at this time.  Safe for discharge; instructed to return immediately for worsening symptoms, change in symptoms or any other concerns.  I have reviewed the patients home medicines and have made adjustments as needed.  Discussed course of treatment with the patient, whom demonstrated understanding.  Patient in agreement and has no further questions.    This chart was dictated using voice recognition software.  Despite best efforts to proofread, errors can occur  which can change the documentation meaning.         Final Clinical Impression(s) / ED Diagnoses Final diagnoses:  Dysuria    Rx / DC Orders ED Discharge Orders          Ordered    fluconazole (DIFLUCAN) 200 MG tablet  Daily  11/08/22 1717              Prince Rome, PA-C [id number]    Elgie Congo, MD 11/08/22 [phone]

## 2022-11-08 NOTE — ED Triage Notes (Signed)
Burning, hurting when urinating. Started Sunday Started macrobid and azo on Tuesday, continued symptoms.

## 2022-11-08 NOTE — ED Notes (Signed)
Discharge instructions given, medications and follow-up care reviewed. Pt verbalized understanding and had no further questions upon discharge.

## 2022-11-08 NOTE — Discharge Instructions (Addendum)
You were evaluated in the emergency department for dysuria.  Urinalysis did not show evidence of UTI today, however as discussed you have already started antibiotic which may skew the results.  Since this antibiotic is work for you in the past, you may continue taking this medication until Saturday for a total of 5 days.  Your dysuria may also be due to benign conditions such as a yeast infection and/or vaginal atrophy.  Further information regarding these conditions has been provided for you to review.    It is strongly recommended to follow-up with urology on Monday or Tuesday next week as discussed, and follow-up with OB/GYN next week for reevaluation and treatment.  If you do not have an OB/GYN, I have provided to local offices contact information's, which you may call to schedule your follow-up appointment with.  I have also included Dr. Noland Fordyce office information for you to schedule with him as well.  Return to the ED for new or worsening symptoms as discussed.

## 2022-11-09 ENCOUNTER — Other Ambulatory Visit (HOSPITAL_BASED_OUTPATIENT_CLINIC_OR_DEPARTMENT_OTHER): Payer: Self-pay

## 2022-11-09 MED ORDER — TRULICITY 1.5 MG/0.5ML ~~LOC~~ SOAJ
1.5000 mg | SUBCUTANEOUS | 3 refills | Status: DC
  Filled 2022-11-09 – 2022-11-11 (×2): qty 6, 84d supply, fill #0
  Filled 2022-11-24: qty 2, 28d supply, fill #0
  Filled 2022-12-28: qty 2, 28d supply, fill #1
  Filled 2023-01-10 – 2023-01-23 (×2): qty 2, 28d supply, fill #2
  Filled 2023-02-08: qty 2, 28d supply, fill #3

## 2022-11-12 ENCOUNTER — Other Ambulatory Visit: Payer: Self-pay

## 2022-11-12 ENCOUNTER — Other Ambulatory Visit (HOSPITAL_BASED_OUTPATIENT_CLINIC_OR_DEPARTMENT_OTHER): Payer: Self-pay

## 2022-11-15 ENCOUNTER — Other Ambulatory Visit (HOSPITAL_BASED_OUTPATIENT_CLINIC_OR_DEPARTMENT_OTHER): Payer: Self-pay

## 2022-11-21 ENCOUNTER — Other Ambulatory Visit (HOSPITAL_BASED_OUTPATIENT_CLINIC_OR_DEPARTMENT_OTHER): Payer: Self-pay

## 2022-11-21 DIAGNOSIS — M5136 Other intervertebral disc degeneration, lumbar region: Secondary | ICD-10-CM | POA: Diagnosis not present

## 2022-11-21 DIAGNOSIS — G4733 Obstructive sleep apnea (adult) (pediatric): Secondary | ICD-10-CM | POA: Diagnosis not present

## 2022-11-21 DIAGNOSIS — M5126 Other intervertebral disc displacement, lumbar region: Secondary | ICD-10-CM | POA: Diagnosis not present

## 2022-11-21 MED ORDER — HYDROCODONE-ACETAMINOPHEN 5-325 MG PO TABS
1.0000 | ORAL_TABLET | ORAL | 0 refills | Status: DC
  Filled 2022-11-21: qty 30, 5d supply, fill #0

## 2022-11-21 MED ORDER — CYCLOBENZAPRINE HCL 10 MG PO TABS
10.0000 mg | ORAL_TABLET | Freq: Three times a day (TID) | ORAL | 0 refills | Status: DC | PRN
  Filled 2022-11-21: qty 60, 20d supply, fill #0

## 2022-11-23 ENCOUNTER — Ambulatory Visit: Payer: Commercial Managed Care - PPO | Admitting: Family Medicine

## 2022-11-24 ENCOUNTER — Other Ambulatory Visit (HOSPITAL_BASED_OUTPATIENT_CLINIC_OR_DEPARTMENT_OTHER): Payer: Self-pay

## 2022-11-27 ENCOUNTER — Other Ambulatory Visit (HOSPITAL_BASED_OUTPATIENT_CLINIC_OR_DEPARTMENT_OTHER): Payer: Self-pay

## 2022-11-27 MED ORDER — METHYLPREDNISOLONE 4 MG PO TBPK
ORAL_TABLET | ORAL | 0 refills | Status: DC
  Filled 2022-11-27 – 2023-01-23 (×4): qty 21, 6d supply, fill #0

## 2022-11-29 ENCOUNTER — Other Ambulatory Visit (HOSPITAL_BASED_OUTPATIENT_CLINIC_OR_DEPARTMENT_OTHER): Payer: Self-pay

## 2022-11-30 ENCOUNTER — Ambulatory Visit: Payer: Commercial Managed Care - PPO | Admitting: Family Medicine

## 2022-11-30 ENCOUNTER — Encounter: Payer: Self-pay | Admitting: Family Medicine

## 2022-11-30 ENCOUNTER — Other Ambulatory Visit (HOSPITAL_BASED_OUTPATIENT_CLINIC_OR_DEPARTMENT_OTHER): Payer: Self-pay

## 2022-11-30 VITALS — BP 114/69 | HR 75 | Temp 98.7°F | Ht 63.0 in | Wt 195.0 lb

## 2022-11-30 DIAGNOSIS — I700 Atherosclerosis of aorta: Secondary | ICD-10-CM | POA: Diagnosis not present

## 2022-11-30 DIAGNOSIS — E119 Type 2 diabetes mellitus without complications: Secondary | ICD-10-CM

## 2022-11-30 DIAGNOSIS — E1169 Type 2 diabetes mellitus with other specified complication: Secondary | ICD-10-CM

## 2022-11-30 DIAGNOSIS — E1159 Type 2 diabetes mellitus with other circulatory complications: Secondary | ICD-10-CM

## 2022-11-30 DIAGNOSIS — E785 Hyperlipidemia, unspecified: Secondary | ICD-10-CM

## 2022-11-30 DIAGNOSIS — I152 Hypertension secondary to endocrine disorders: Secondary | ICD-10-CM

## 2022-11-30 DIAGNOSIS — E890 Postprocedural hypothyroidism: Secondary | ICD-10-CM

## 2022-11-30 LAB — BAYER DCA HB A1C WAIVED: HB A1C (BAYER DCA - WAIVED): 6 % — ABNORMAL HIGH (ref 4.8–5.6)

## 2022-11-30 MED ORDER — FREESTYLE LIBRE 3 SENSOR MISC: 4 refills | Status: DC

## 2022-11-30 NOTE — Progress Notes (Signed)
Subjective: CC:DM PCP: Ann Norlander, DO ID:[id number] Ann Peterson is a 57 y.o. female presenting to clinic today for:  1. Type 2 Diabetes with hypertension, hyperlipidemia:  Trulicity recently increased to 1.5mg  weekly.  She has not been monitoring her sugars of the last couple of days because she had emergency lumbar surgery.  She was treated with steroids increase her sugar is up.  She has been compliant with new doses of Trulicity.  Does not report any GI disturbance with that.  Compliant with amlodipine, metoprolol.  Not currently treated with a statin.  She had elevated liver function test which is one of the reasons why we had not yet started this  Last eye exam: Scheduled Last foot exam: UTD Last A1c:  Lab Results  Component Value Date   HGBA1C 7.0 (H) 08/15/2022   Nephropathy screen indicated?: UTD Last flu, zoster and/or pneumovax:  Immunization History  Administered Date(s) Administered   Influenza-Unspecified 06/10/2017   PFIZER(Purple Top)SARS-COV-2 Vaccination 09/23/2019, 11/03/2019, 06/24/2020   Tdap 01/10/2010   2. Hypothyroidism Compliant with thyroid replacement medication.  No reports of tremor or heart palpitations  ROS: Per HPI  Allergies  Allergen Reactions   Pollen Extract    Past Medical History:  Diagnosis Date   Cancer ([place]) 2000   thryoid papillary carcinoma   COVID-19 12/03/2019   body aches, joint pain, fever 104 x 4 days, then symptoms resolved   History of kidney stones    Hyperlipidemia    Hypertension    PONV (postoperative nausea and vomiting)    n/v after appendectomy 5-6 yrs ago   Sleep apnea    uses cpap   Thyroid disease    thyroidectomy due to cancer    Current Outpatient Medications:    albuterol (VENTOLIN HFA) 108 (90 Base) MCG/ACT inhaler, Inhale 2 puffs into the lungs every 6 (six) hours as needed for wheezing or shortness of breath., Disp: 8 g, Rfl: 2   amLODipine (NORVASC) 10 MG tablet, Take 1 tablet (10 mg total) by  mouth daily., Disp: 90 tablet, Rfl: 3   BIOTIN PO, Take 1 capsule by mouth every evening., Disp: , Rfl:    blood glucose meter kit and supplies, Use up to four times daily as directed., Disp: 1 each, Rfl: 0   celecoxib (CELEBREX) 50 MG capsule, Take 1 capsule (50 mg total) by mouth 2 (two) times daily as needed for pain., Disp: 60 capsule, Rfl: 0   Cholecalciferol (VITAMIN D) 50 MCG (2000 UT) CAPS, Take 1 capsule by mouth every evening., Disp: , Rfl:    cyclobenzaprine (FLEXERIL) 10 MG tablet, Take 1 tablet (10 mg total) by mouth 3 (three) times daily as needed for muscle spasms, Disp: 60 tablet, Rfl: 0   Dulaglutide (TRULICITY) 1.5 0000000 SOPN, Inject 1.5 mg into the skin once a week., Disp: 6 mL, Rfl: 3   fluticasone (FLONASE) 50 MCG/ACT nasal spray, Place 2 sprays into both nostrils daily., Disp: 48 g, Rfl: 1   gabapentin (NEURONTIN) 300 MG capsule, Take 1 capsule (300 mg total) by mouth 3 (three) times daily., Disp: 90 capsule, Rfl: 0   glucose blood test strip, Use as instructed to check blood sugar daily., Disp: 100 each, Rfl: PRN   HYDROcodone-acetaminophen (NORCO/VICODIN) 5-325 MG tablet, Take 1 tablet by mouth every 4 (four) hours as needed for moderate pain., Disp: 12 tablet, Rfl: 0   HYDROcodone-acetaminophen (NORCO/VICODIN) 5-325 MG tablet, Take 1 tablet by mouth every 6 (six) hours as needed for pain, Disp:  20 tablet, Rfl: 0   HYDROcodone-acetaminophen (NORCO/VICODIN) 5-325 MG tablet, Take 1 tablet by mouth every 4 (four) hours as needed for pain, Disp: 30 tablet, Rfl: 0   indapamide (LOZOL) 2.5 MG tablet, TAKE 1 TABLET BY MOUTH EVERY DAY, Disp: 90 tablet, Rfl: 3   Lancets (FREESTYLE) lancets, Test blood glucose once daily as directed., Disp: 100 each, Rfl: PRN   levothyroxine (SYNTHROID) 175 MCG tablet, Take 1 tablet (175 mcg total) by mouth daily., Disp: 90 tablet, Rfl: 3   magnesium oxide (MAG-OX) 400 MG tablet, Take 400 mg by mouth daily., Disp: , Rfl:    methylPREDNISolone  (MEDROL DOSEPAK) 4 MG TBPK tablet, Take as directed., Disp: 21 tablet, Rfl: 0   metoprolol succinate (TOPROL-XL) 100 MG 24 hr tablet, Take 1 tablet (100 mg total) by mouth daily. With or immediately following a meal, Disp: 90 tablet, Rfl: 3   ondansetron (ZOFRAN) 4 MG tablet, Take 1 tablet (4 mg total) by mouth daily as needed for nausea or vomiting., Disp: 20 tablet, Rfl: 1   potassium chloride (KLOR-CON M) 10 MEQ tablet, Take 1 tablet (10 mEq total) by mouth 2 (two) times daily., Disp: 30 tablet, Rfl: 0   tiZANidine (ZANAFLEX) 4 MG tablet, Take 1 tablet (4 mg total) by mouth every 6 (six) hours as needed for muscle spasms., Disp: 30 tablet, Rfl: 1   traZODone (DESYREL) 50 MG tablet, Take 0.5-2 tablets (25-100 mg total) by mouth at bedtime as needed for sleep., Disp: 180 tablet, Rfl: 3 Social History   Socioeconomic History   Marital status: Widowed    Spouse name: Not on file   Number of children: Not on file   Years of education: Not on file   Highest education level: Associate degree: academic program  Occupational History   Not on file  Tobacco Use   Smoking status: Never   Smokeless tobacco: Never  Vaping Use   Vaping Use: Never used  Substance and Sexual Activity   Alcohol use: No   Drug use: No   Sexual activity: Not on file  Other Topics Concern   Not on file  Social History Narrative   Not on file   Social Determinants of Health   Financial Resource Strain: Low Risk  (11/29/2022)   Overall Financial Resource Strain (CARDIA)    Difficulty of Paying Living Expenses: Not hard at all  Food Insecurity: No Food Insecurity (11/29/2022)   Hunger Vital Sign    Worried About Running Out of Food in the Last Year: Never true    Ran Out of Food in the Last Year: Never true  Transportation Needs: No Transportation Needs (11/29/2022)   PRAPARE - Hydrologist (Medical): No    Lack of Transportation (Non-Medical): No  Physical Activity: Insufficiently  Active (11/29/2022)   Exercise Vital Sign    Days of Exercise per Week: 4 days    Minutes of Exercise per Session: 30 min  Stress: No Stress Concern Present (11/29/2022)   [place]    Feeling of Stress : Not at all  Social Connections: Moderately Integrated (11/29/2022)   Social Connection and Isolation Panel [NHANES]    Frequency of Communication with Friends and Family: More than three times a week    Frequency of Social Gatherings with Friends and Family: Three times a week    Attends Religious Services: More than 4 times per year    Active Member of Clubs  or Organizations: Yes    Attends Archivist Meetings: More than 4 times per year    Marital Status: Widowed  Human resources officer Violence: Not on file   Family History  Problem Relation Age of Onset   Hypertension Mother    Dementia Mother    Macular degeneration Mother    Stroke Mother    COPD Father        lung cancer   Cancer Brother        colon cancer   Cancer Brother        brain tumor    Objective: Office vital signs reviewed. BP 114/69   Pulse 75   Temp 98.7 F (37.1 C)   Ht 5\' 3"  (1.6 m)   Wt 195 lb (88.5 kg)   LMP 09/07/2016 (Exact Date)   SpO2 94%   BMI 34.54 kg/m   Physical Examination:  General: Awake, alert, well nourished, No acute distress HEENT: sclera white, MMM.  No exophthalmos.  No goiter Cardio: Regular rate and rhythm , S1S2 heard, no murmurs appreciated Pulm: clear to auscultation bilaterally, no wheezes, rhonchi or rales; normal work of breathing on room air Lumbar spine: Healing postsurgical site noted at the lower lumbar spine.  No drainage, bleeding.  No dehiscence appreciated  Assessment/ Plan: 57 y.o. female   New onset type 2 diabetes mellitus ([place]) - Plan: Bayer DCA Hb A1c Waived, CMP14+EGFR, Continuous Blood Gluc Sensor (FREESTYLE LIBRE 3 SENSOR) MISC  Aortic atherosclerosis ([facility]) - Plan:  CMP14+EGFR  Hyperlipidemia associated with type 2 diabetes mellitus ([place])  Hypertension associated with diabetes ([place]) - Plan: CMP14+EGFR  Morbid obesity ([place]) - Plan: CMP14+EGFR  Postoperative hypothyroidism - Plan: TSH, T4, Free  Check A1c.  Continue Trulicity at current dose.  Plan to advance to 3 mg if levels of sugar are not appropriate at next visit.  Freestyle libre ordered and sample provided today.  Encouraged continuous blood glucose monitoring and discussed troubleshooting if she has unusual readings.  Have diabetic eye exam sent to me  Plan for statin initiation but I would like to make sure that her LFTs have come back to a reasonable level before initiating.  Blood pressure is controlled.  No changes  Check thyroid levels  No orders of the defined types were placed in this encounter.  No orders of the defined types were placed in this encounter.    Ann Norlander, DO [place] [phone]

## 2022-12-01 LAB — CMP14+EGFR
ALT: 41 IU/L — ABNORMAL HIGH (ref 0–32)
AST: 26 IU/L (ref 0–40)
Albumin/Globulin Ratio: 1.8 (ref 1.2–2.2)
Albumin: 4.6 g/dL (ref 3.8–4.9)
Alkaline Phosphatase: 74 IU/L (ref 44–121)
BUN/Creatinine Ratio: 27 — ABNORMAL HIGH (ref 9–23)
BUN: 21 mg/dL (ref 6–24)
Bilirubin Total: 0.3 mg/dL (ref 0.0–1.2)
CO2: 27 mmol/L (ref 20–29)
Calcium: 9.7 mg/dL (ref 8.7–10.2)
Chloride: 96 mmol/L (ref 96–106)
Creatinine, Ser: 0.79 mg/dL (ref 0.57–1.00)
Globulin, Total: 2.5 g/dL (ref 1.5–4.5)
Glucose: 86 mg/dL (ref 70–99)
Potassium: 4.2 mmol/L (ref 3.5–5.2)
Sodium: 140 mmol/L (ref 134–144)
Total Protein: 7.1 g/dL (ref 6.0–8.5)
eGFR: 88 mL/min/{1.73_m2} (ref >59)

## 2022-12-01 LAB — TSH: TSH: 3.22 u[IU]/mL (ref 0.450–4.500)

## 2022-12-01 LAB — T4, FREE: Free T4: 1.57 ng/dL (ref 0.82–1.77)

## 2022-12-03 ENCOUNTER — Other Ambulatory Visit (HOSPITAL_BASED_OUTPATIENT_CLINIC_OR_DEPARTMENT_OTHER): Payer: Self-pay

## 2022-12-03 ENCOUNTER — Telehealth: Payer: Self-pay | Admitting: Family Medicine

## 2022-12-03 NOTE — Telephone Encounter (Signed)
Pt cannot afford Continuous Blood Gluc Sensor (FREESTYLE LIBRE 3 SENSOR) MISC. She says insurance told her she needs a PA. Please start a PA.

## 2022-12-03 NOTE — Telephone Encounter (Signed)
[employer] rx phone number [phone]

## 2022-12-06 ENCOUNTER — Telehealth: Payer: Self-pay

## 2022-12-06 ENCOUNTER — Other Ambulatory Visit (HOSPITAL_COMMUNITY): Payer: Self-pay

## 2022-12-06 NOTE — Telephone Encounter (Signed)
Pharmacy Patient Advocate Encounter   Received notification that prior authorization for Freestyle libre 3 sensor is required/requested.    PA submitted on 12/06/22 to (ins) [employer] via CoverMyMeds Key# [id number] Status is pending

## 2022-12-07 ENCOUNTER — Other Ambulatory Visit (HOSPITAL_BASED_OUTPATIENT_CLINIC_OR_DEPARTMENT_OTHER): Payer: Self-pay

## 2022-12-10 ENCOUNTER — Other Ambulatory Visit: Payer: Self-pay

## 2022-12-10 MED FILL — Indapamide Tab 2.5 MG: ORAL | 90 days supply | Qty: 90 | Fill #2 | Status: CN

## 2022-12-13 NOTE — Telephone Encounter (Signed)
Pharmacy Patient Advocate Encounter  Received notification from [employer] that the request for prior authorization for Freestyle Libre 3 sensor has been denied due to the insurance requires you to meet one of the following rules: being treated with insulin or have a clinical need that cannot be managed with self-monitoring of blood glucose(such as frequent hypoglycemia [low blood sugar], hypoglycemic unawareness [a condition in which you do not experience the usual early warning symptoms of hypoglycemia, such as  feeling shaky, sweating, chills, or confusion], or you are unable to achieve control of  diabetes).     Please be advised we currently do not have a Pharmacist to review denials, therefore you will need to process appeals accordingly as needed. Thanks for your support at this time.   You may call [phone] or fax [phone], to appeal.   Denial letter attached to chart

## 2022-12-16 ENCOUNTER — Other Ambulatory Visit (HOSPITAL_BASED_OUTPATIENT_CLINIC_OR_DEPARTMENT_OTHER): Payer: Self-pay

## 2022-12-17 NOTE — Telephone Encounter (Signed)
Pharmacy Patient Advocate Encounter  Received notification that the request for prior authorization for [facility] # sensor has been denied due to when used for the management of type 2 diabetes mellitus, our guideline named continuous glucose monitor step override (reviewed for FreeStyle Libre 3 sensors) requires that you meet one of the following rules: 1) You are being treated with insulin. 2) You have a clinical need that cannot be managed with self-monitoring of blood glucose (such as frequent hypoglycemia [low blood sugar], hypoglycemic unawareness [a condition in which you do not experience the usual early warning symptoms of hypoglycemia, such as feeling shaky, sweating, chills, or confusion], or you are unable to achieve control of diabetes). This request has been denied because we did not receive information that you meet the requirement listed above. Please work with your doctor to use a different product or get [place] more information if it will allow [place] to approve this reques .    Please be advised we currently do not have a Pharmacist to review denials, therefore you will need to process appeals accordingly as needed. Thanks for your support at this time.   You may call [phone] or fax [phone], to appeal.

## 2022-12-17 NOTE — Telephone Encounter (Signed)
Please inform pt of issue with insurance.  I will cc Raynelle Fanning to see if she has any other ideas.

## 2022-12-17 NOTE — Telephone Encounter (Signed)
Can you look at this

## 2022-12-18 NOTE — Telephone Encounter (Signed)
See if the out of pocket cost of $75 for the Ann Peterson is in Ann Peterson' budget.  If so, this would be the way for her to get them.  Apparently, they are not covered if not on insulin with [facility]

## 2022-12-18 NOTE — Telephone Encounter (Signed)
[facility] doesn't cover the libre CGMs (which is unfortunate) & she would have to be on insulin, pregnant or hypoglycemia unawareness  [facility] currently covers dexcom, however the copay is still usually high.  Her best bet would be [place] $75 cash

## 2022-12-19 NOTE — Telephone Encounter (Signed)
Patient aware and verbalized understanding. Patient does not want to spean that money she will continue to check as she is.

## 2022-12-28 ENCOUNTER — Other Ambulatory Visit: Payer: Self-pay

## 2022-12-28 ENCOUNTER — Other Ambulatory Visit: Payer: Self-pay | Admitting: Family Medicine

## 2022-12-28 ENCOUNTER — Other Ambulatory Visit (HOSPITAL_BASED_OUTPATIENT_CLINIC_OR_DEPARTMENT_OTHER): Payer: Self-pay

## 2022-12-28 DIAGNOSIS — M159 Polyosteoarthritis, unspecified: Secondary | ICD-10-CM

## 2022-12-28 DIAGNOSIS — M150 Primary generalized (osteo)arthritis: Secondary | ICD-10-CM

## 2022-12-28 MED ORDER — CELECOXIB 50 MG PO CAPS
50.0000 mg | ORAL_CAPSULE | Freq: Two times a day (BID) | ORAL | 0 refills | Status: DC | PRN
Start: 2022-12-28 — End: 2023-02-08
  Filled 2022-12-28: qty 60, 30d supply, fill #0

## 2023-01-04 ENCOUNTER — Other Ambulatory Visit (HOSPITAL_BASED_OUTPATIENT_CLINIC_OR_DEPARTMENT_OTHER): Payer: Self-pay

## 2023-01-07 ENCOUNTER — Other Ambulatory Visit (HOSPITAL_BASED_OUTPATIENT_CLINIC_OR_DEPARTMENT_OTHER): Payer: Self-pay

## 2023-01-10 ENCOUNTER — Other Ambulatory Visit (HOSPITAL_BASED_OUTPATIENT_CLINIC_OR_DEPARTMENT_OTHER): Payer: Self-pay

## 2023-01-14 ENCOUNTER — Ambulatory Visit
Admission: RE | Admit: 2023-01-14 | Discharge: 2023-01-14 | Disposition: A | Discharge status: Home / Self Care | Payer: Commercial Managed Care - PPO | Source: Ambulatory Visit | Attending: Urgent Care | Admitting: Urgent Care

## 2023-01-14 ENCOUNTER — Ambulatory Visit (INDEPENDENT_AMBULATORY_CARE_PROVIDER_SITE_OTHER): Payer: Commercial Managed Care - PPO

## 2023-01-14 VITALS — BP 146/83 | HR 77 | Temp 98.8°F | Resp 18

## 2023-01-14 DIAGNOSIS — B349 Viral infection, unspecified: Secondary | ICD-10-CM | POA: Insufficient documentation

## 2023-01-14 DIAGNOSIS — E119 Type 2 diabetes mellitus without complications: Secondary | ICD-10-CM | POA: Insufficient documentation

## 2023-01-14 DIAGNOSIS — Z1152 Encounter for screening for COVID-19: Secondary | ICD-10-CM | POA: Insufficient documentation

## 2023-01-14 DIAGNOSIS — R0600 Dyspnea, unspecified: Secondary | ICD-10-CM | POA: Diagnosis not present

## 2023-01-14 DIAGNOSIS — Z794 Long term (current) use of insulin: Secondary | ICD-10-CM | POA: Diagnosis not present

## 2023-01-14 DIAGNOSIS — R059 Cough, unspecified: Secondary | ICD-10-CM | POA: Diagnosis not present

## 2023-01-14 LAB — POCT INFLUENZA A/B
Influenza A, POC: NEGATIVE
Influenza B, POC: NEGATIVE

## 2023-01-14 MED ORDER — PREDNISONE 20 MG PO TABS: ORAL_TABLET | ORAL | 0 refills | Status: DC

## 2023-01-14 MED ORDER — PROMETHAZINE-DM 6.25-15 MG/5ML PO SYRP: 5.0000 mL | ORAL_SOLUTION | Freq: Every evening | ORAL | 0 refills | Status: DC | PRN

## 2023-01-14 NOTE — Discharge Instructions (Signed)
We will notify you of your test results as they arrive and may take between about 24 hours.  I encourage you to sign up for MyChart if you have not already done so as this can be the easiest way for us to communicate results to you online or through a phone app.  Generally, we only contact you if it is a positive test result.  In the meantime, if you develop worsening symptoms including fever, chest pain, shortness of breath despite our current treatment plan then please report to the emergency room as this may be a sign of worsening status from possible viral infection.  Otherwise, we will manage this as a viral syndrome. For sore throat or cough try using a honey-based tea. Use 3 teaspoons of honey with juice squeezed from half lemon. Place shaved pieces of ginger into 1/2-1 cup of water and warm over stove top. Then mix the ingredients and repeat every 4 hours as needed. Please take Tylenol 500mg-650mg every 6 hours for aches and pains, fevers. Hydrate very well with at least 2 liters of water. Eat light meals such as soups to replenish electrolytes and soft fruits, veggies. Start an antihistamine like Zyrtec (10mg daily) for postnasal drainage, sinus congestion.  You can take this together with prednisone and albuterol.  Use the cough medications as needed.  

## 2023-01-14 NOTE — ED Triage Notes (Signed)
Pt reports runny nose, chest congestion, difficulty breathing since 01/10/23. Albuterol gives some relief.   Reports her job told her she needs to be check for Flu and COVID.

## 2023-01-14 NOTE — ED Provider Notes (Signed)
[facility]  Note:  This document was prepared using Conservation officer, historic buildings and may include unintentional dictation errors.  MRN: [id number] DOB: 1966/04/22  Subjective:   Ann Peterson is a 57 y.o. female presenting for 5-day history of acute onset runny nose, chest congestion, shortness of breath, malaise and fatigue, body pains.  Has been using albuterol with some relief.  Reports that she has also done well with prednisone in the past.  Does not have an official diagnosis of asthma.  She does have type 2 diabetes treated without insulin and is managed well as her last A1c was 6.0%.  No smoking of any kind including cigarettes, cigars, vaping, marijuana use.  Her employer requires testing for flu and COVID.  No current facility-administered medications for this encounter.  Current Outpatient Medications:    albuterol (VENTOLIN HFA) 108 (90 Base) MCG/ACT inhaler, Inhale 2 puffs into the lungs every 6 (six) hours as needed for wheezing or shortness of breath., Disp: 8 g, Rfl: 2   amLODipine (NORVASC) 10 MG tablet, Take 1 tablet (10 mg total) by mouth daily., Disp: 90 tablet, Rfl: 3   BIOTIN PO, Take 1 capsule by mouth every evening., Disp: , Rfl:    blood glucose meter kit and supplies, Use up to four times daily as directed., Disp: 1 each, Rfl: 0   celecoxib (CELEBREX) 50 MG capsule, Take 1 capsule (50 mg total) by mouth 2 (two) times daily as needed for pain., Disp: 60 capsule, Rfl: 0   Cholecalciferol (VITAMIN D) 50 MCG (2000 UT) CAPS, Take 1 capsule by mouth every evening., Disp: , Rfl:    Continuous Blood Gluc Sensor (FREESTYLE LIBRE 3 SENSOR) MISC, Place 1 sensor on the skin every 14 days. Use to check glucose continuously E11.9, Disp: 6 each, Rfl: 4   cyclobenzaprine (FLEXERIL) 10 MG tablet, Take 1 tablet (10 mg total) by mouth 3 (three) times daily as needed for muscle spasms, Disp: 60 tablet, Rfl: 0   Dulaglutide (TRULICITY) 1.5 MG/0.5ML SOPN, Inject  1.5 mg into the skin once a week., Disp: 6 mL, Rfl: 3   fluticasone (FLONASE) 50 MCG/ACT nasal spray, Place 2 sprays into both nostrils daily., Disp: 48 g, Rfl: 1   gabapentin (NEURONTIN) 300 MG capsule, Take 1 capsule (300 mg total) by mouth 3 (three) times daily., Disp: 90 capsule, Rfl: 0   glucose blood test strip, Use as instructed to check blood sugar daily., Disp: 100 each, Rfl: PRN   HYDROcodone-acetaminophen (NORCO/VICODIN) 5-325 MG tablet, Take 1 tablet by mouth every 4 (four) hours as needed for moderate pain., Disp: 12 tablet, Rfl: 0   HYDROcodone-acetaminophen (NORCO/VICODIN) 5-325 MG tablet, Take 1 tablet by mouth every 6 (six) hours as needed for pain, Disp: 20 tablet, Rfl: 0   HYDROcodone-acetaminophen (NORCO/VICODIN) 5-325 MG tablet, Take 1 tablet by mouth every 4 (four) hours as needed for pain, Disp: 30 tablet, Rfl: 0   indapamide (LOZOL) 2.5 MG tablet, TAKE 1 TABLET BY MOUTH EVERY DAY, Disp: 90 tablet, Rfl: 3   Lancets (FREESTYLE) lancets, Test blood glucose once daily as directed., Disp: 100 each, Rfl: PRN   levothyroxine (SYNTHROID) 175 MCG tablet, Take 1 tablet (175 mcg total) by mouth daily., Disp: 90 tablet, Rfl: 3   magnesium oxide (MAG-OX) 400 MG tablet, Take 400 mg by mouth daily., Disp: , Rfl:    methylPREDNISolone (MEDROL DOSEPAK) 4 MG TBPK tablet, Take as directed., Disp: 21 tablet, Rfl: 0   metoprolol succinate (TOPROL-XL)  100 MG 24 hr tablet, Take 1 tablet (100 mg total) by mouth daily. With or immediately following a meal, Disp: 90 tablet, Rfl: 3   ondansetron (ZOFRAN) 4 MG tablet, Take 1 tablet (4 mg total) by mouth daily as needed for nausea or vomiting., Disp: 20 tablet, Rfl: 1   potassium chloride (KLOR-CON M) 10 MEQ tablet, Take 1 tablet (10 mEq total) by mouth 2 (two) times daily., Disp: 30 tablet, Rfl: 0   tiZANidine (ZANAFLEX) 4 MG tablet, Take 1 tablet (4 mg total) by mouth every 6 (six) hours as needed for muscle spasms., Disp: 30 tablet, Rfl: 1   traZODone  (DESYREL) 50 MG tablet, Take 0.5-2 tablets (25-100 mg total) by mouth at bedtime as needed for sleep., Disp: 180 tablet, Rfl: 3   Allergies  Allergen Reactions   Pollen Extract     Past Medical History:  Diagnosis Date   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   COVID-19 12/03/2019   body aches, joint pain, fever 104 x 4 days, then symptoms resolved   History of kidney stones    Hyperlipidemia    Hypertension    PONV (postoperative nausea and vomiting)    n/v after appendectomy 5-6 yrs ago   Sleep apnea    uses cpap   Thyroid disease    thyroidectomy due to cancer     Past Surgical History:  Procedure Laterality Date   ABDOMINAL HYSTERECTOMY  09/12/2016   partial   APPENDECTOMY  2011   BACK SURGERY     CESAREAN SECTION     x 3   CHOLECYSTECTOMY  2000   laproscopic   CYSTOSCOPY W/ URETERAL STENT PLACEMENT N/A 12/03/2019   Procedure: CYSTOSCOPY WITH STENT PLACEMENT, RETROGRADE;  Surgeon: Malen Gauze, MD;  Location: [facility];  Service: Urology;  Laterality: N/A;   CYSTOSCOPY WITH RETROGRADE PYELOGRAM, URETEROSCOPY AND STENT PLACEMENT Left 12/24/2019   Procedure: CYSTOSCOPY WITH RETROGRADE PYELOGRAM, URETEROSCOPY, STENT REMOVAL AND STONE EXTRACTION;  Surgeon: Malen Gauze, MD;  Location: [facility];  Service: Urology;  Laterality: Left;   CYSTOSCOPY WITH RETROGRADE PYELOGRAM, URETEROSCOPY AND STENT PLACEMENT Bilateral 05/04/2021   Procedure: CYSTOSCOPY WITH RETROGRADE PYELOGRAM, URETEROSCOPY AND STENT PLACEMENT;  Surgeon: Malen Gauze, MD;  Location: [facility];  Service: Urology;  Laterality: Bilateral;   discectomy     HOLMIUM LASER APPLICATION Bilateral 05/04/2021   Procedure: HOLMIUM LASER APPLICATION;  Surgeon: Malen Gauze, MD;  Location: [facility];  Service: Urology;  Laterality: Bilateral;   TOTAL THYROIDECTOMY  2000   sx and radiation done    Family History  Problem Relation Age of Onset   Hypertension Mother     Dementia Mother    Macular degeneration Mother    Stroke Mother    COPD Father        lung cancer   Cancer Brother        colon cancer   Cancer Brother        brain tumor    Social History   Tobacco Use   Smoking status: Never   Smokeless tobacco: Never  Vaping Use   Vaping Use: Never used  Substance Use Topics   Alcohol use: No   Drug use: No    ROS   Objective:   Vitals: BP (!) 146/83 (BP Location: Left Arm)   Pulse 77   Temp 98.8 F (37.1 C) (Oral)   Resp 18   LMP 09/07/2016 (Exact Date)   SpO2 94%  Physical Exam Constitutional:      General: She is not in acute distress.    Appearance: Normal appearance. She is well-developed and normal weight. She is not ill-appearing, toxic-appearing or diaphoretic.  HENT:     Head: Normocephalic and atraumatic.     Right Ear: Tympanic membrane, ear canal and external ear normal. No drainage or tenderness. No middle ear effusion. There is no impacted cerumen. Tympanic membrane is not erythematous or bulging.     Left Ear: Tympanic membrane, ear canal and external ear normal. No drainage or tenderness.  No middle ear effusion. There is no impacted cerumen. Tympanic membrane is not erythematous or bulging.     Nose: Congestion present. No rhinorrhea.     Mouth/Throat:     Mouth: Mucous membranes are moist. No oral lesions.     Pharynx: No pharyngeal swelling, oropharyngeal exudate, posterior oropharyngeal erythema or uvula swelling.     Tonsils: No tonsillar exudate or tonsillar abscesses.  Eyes:     General: No scleral icterus.       Right eye: No discharge.        Left eye: No discharge.     Extraocular Movements: Extraocular movements intact.     Right eye: Normal extraocular motion.     Left eye: Normal extraocular motion.     Conjunctiva/sclera: Conjunctivae normal.  Cardiovascular:     Rate and Rhythm: Normal rate and regular rhythm.     Heart sounds: Normal heart sounds. No murmur heard.    No friction rub. No  gallop.  Pulmonary:     Effort: Pulmonary effort is normal. No respiratory distress.     Breath sounds: No stridor. No wheezing, rhonchi or rales.  Chest:     Chest wall: No tenderness.  Musculoskeletal:     Cervical back: Normal range of motion and neck supple.  Lymphadenopathy:     Cervical: No cervical adenopathy.  Skin:    General: Skin is warm and dry.  Neurological:     General: No focal deficit present.     Mental Status: She is alert and oriented to person, place, and time.  Psychiatric:        Mood and Affect: Mood normal.        Behavior: Behavior normal.     Results for orders placed or performed during the hospital encounter of 01/14/23 (from the past 24 hour(s))  POCT Influenza A/B     Status: None   Collection Time: 01/14/23 12:29 PM  Result Value Ref Range   Influenza A, POC Negative Negative   Influenza B, POC Negative Negative   DG Chest 2 View  Result Date: 01/14/2023 CLINICAL DATA:  Cough, difficulty breathing. EXAM: CHEST - 2 VIEW COMPARISON:  September 26, 2022. FINDINGS: The heart size and mediastinal contours are within normal limits. Right lung is clear. Stable minimal left lingular subsegmental atelectasis or scarring. The visualized skeletal structures are unremarkable. IMPRESSION: Stable minimal left lingular subsegmental atelectasis or scarring. Electronically Signed   By: Lupita Raider M.D.   On: 01/14/2023 12:32     Assessment and Plan :   PDMP not reviewed this encounter.  1. Acute viral syndrome    I was agreeable to help patient with the prednisone course as she has typically done well with this.  Maintain albuterol.  Use supportive care otherwise.  COVID testing is pending.  Counseled patient on potential for adverse effects with medications prescribed/recommended today, ER and return-to-clinic precautions discussed, patient verbalized understanding.  Wallis Bamberg, [place] 01/14/23 1313

## 2023-01-15 LAB — SARS CORONAVIRUS 2 (TAT 6-24 HRS): SARS Coronavirus 2: NEGATIVE

## 2023-01-16 ENCOUNTER — Other Ambulatory Visit (HOSPITAL_BASED_OUTPATIENT_CLINIC_OR_DEPARTMENT_OTHER): Payer: Self-pay

## 2023-01-18 ENCOUNTER — Other Ambulatory Visit (HOSPITAL_BASED_OUTPATIENT_CLINIC_OR_DEPARTMENT_OTHER): Payer: Self-pay

## 2023-01-21 ENCOUNTER — Other Ambulatory Visit (HOSPITAL_BASED_OUTPATIENT_CLINIC_OR_DEPARTMENT_OTHER): Payer: Self-pay

## 2023-01-23 ENCOUNTER — Other Ambulatory Visit (HOSPITAL_BASED_OUTPATIENT_CLINIC_OR_DEPARTMENT_OTHER): Payer: Self-pay

## 2023-01-23 ENCOUNTER — Other Ambulatory Visit: Payer: Self-pay | Admitting: Family Medicine

## 2023-01-23 ENCOUNTER — Other Ambulatory Visit: Payer: Self-pay

## 2023-01-23 DIAGNOSIS — J302 Other seasonal allergic rhinitis: Secondary | ICD-10-CM

## 2023-01-23 MED ORDER — FLUTICASONE PROPIONATE 50 MCG/ACT NA SUSP
2.0000 | Freq: Every day | NASAL | 1 refills | Status: DC
Start: 2023-01-23 — End: 2023-08-14
  Filled 2023-01-23: qty 48, 90d supply, fill #0
  Filled 2023-02-08 – 2023-05-23 (×2): qty 48, 90d supply, fill #1

## 2023-01-27 ENCOUNTER — Other Ambulatory Visit (HOSPITAL_BASED_OUTPATIENT_CLINIC_OR_DEPARTMENT_OTHER): Payer: Self-pay

## 2023-02-07 ENCOUNTER — Encounter (INDEPENDENT_AMBULATORY_CARE_PROVIDER_SITE_OTHER): Payer: Commercial Managed Care - PPO | Admitting: Family Medicine

## 2023-02-07 DIAGNOSIS — E1169 Type 2 diabetes mellitus with other specified complication: Secondary | ICD-10-CM

## 2023-02-08 ENCOUNTER — Other Ambulatory Visit: Payer: Self-pay

## 2023-02-08 ENCOUNTER — Other Ambulatory Visit: Payer: Self-pay | Admitting: Family Medicine

## 2023-02-08 ENCOUNTER — Other Ambulatory Visit (HOSPITAL_BASED_OUTPATIENT_CLINIC_OR_DEPARTMENT_OTHER): Payer: Self-pay

## 2023-02-08 DIAGNOSIS — M159 Polyosteoarthritis, unspecified: Secondary | ICD-10-CM

## 2023-02-08 MED ORDER — CELECOXIB 50 MG PO CAPS
50.0000 mg | ORAL_CAPSULE | Freq: Two times a day (BID) | ORAL | 99 refills | Status: DC | PRN
Start: 2023-02-08 — End: 2024-03-26
  Filled 2023-02-08: qty 60, 30d supply, fill #0
  Filled 2023-04-26: qty 60, 30d supply, fill #1
  Filled 2023-07-20: qty 60, 30d supply, fill #2
  Filled 2023-10-07: qty 60, 30d supply, fill #3
  Filled 2023-10-25 – 2024-01-04 (×2): qty 60, 30d supply, fill #4
  Filled 2024-01-10 – 2024-02-01 (×2): qty 60, 30d supply, fill #5

## 2023-02-08 MED ORDER — CYCLOBENZAPRINE HCL 10 MG PO TABS
10.0000 mg | ORAL_TABLET | Freq: Three times a day (TID) | ORAL | 0 refills | Status: DC | PRN
  Filled 2023-02-08: qty 60, 20d supply, fill #0

## 2023-02-08 MED FILL — Indapamide Tab 2.5 MG: ORAL | 90 days supply | Qty: 90 | Fill #2 | Status: AC

## 2023-02-09 ENCOUNTER — Other Ambulatory Visit (HOSPITAL_BASED_OUTPATIENT_CLINIC_OR_DEPARTMENT_OTHER): Payer: Self-pay

## 2023-02-11 ENCOUNTER — Other Ambulatory Visit (HOSPITAL_BASED_OUTPATIENT_CLINIC_OR_DEPARTMENT_OTHER): Payer: Self-pay

## 2023-02-11 ENCOUNTER — Other Ambulatory Visit: Payer: Self-pay | Admitting: Family Medicine

## 2023-02-11 DIAGNOSIS — E1169 Type 2 diabetes mellitus with other specified complication: Secondary | ICD-10-CM

## 2023-02-11 MED ORDER — TIRZEPATIDE 2.5 MG/0.5ML ~~LOC~~ SOAJ
2.5000 mg | SUBCUTANEOUS | 0 refills | Status: DC
Start: 2023-02-11 — End: 2023-03-29
  Filled 2023-02-11: qty 2, 28d supply, fill #0
  Filled 2023-03-07: qty 2, 28d supply, fill #1

## 2023-02-11 NOTE — Telephone Encounter (Signed)
Please see the MyChart message reply(ies) for my assessment and plan.    This patient gave consent for this Medical Advice Message and is aware that it may result in a bill to their insurance company, as well as the possibility of receiving a bill for a co-payment or deductible. They are an established patient, but are not seeking medical advice exclusively about a problem treated during an in person or video visit in the last seven days. I did not recommend an in person or video visit within seven days of my reply.    I spent a total of 7 minutes cumulative time within 7 days through MyChart messaging.  Hassaan Crite, DO   

## 2023-02-11 NOTE — Progress Notes (Signed)
Not meeting clinical goals. Failed Ozempic due to side effects.  Trulicity not working as expected.  Trial of Mounjaro.  Cautioned side effects which would warrant discontinuation.  Medications Discontinued During This Encounter  Medication Reason   Dulaglutide (TRULICITY) 1.5 MG/0.5ML SOPN    Meds ordered this encounter  Medications   tirzepatide (MOUNJARO) 2.5 MG/0.5ML Pen    Sig: Inject 2.5 mg into the skin once a week.    Dispense:  6 mL    Refill:  0   Follow up in 2-3 months for DM recheck.

## 2023-02-21 ENCOUNTER — Other Ambulatory Visit (HOSPITAL_BASED_OUTPATIENT_CLINIC_OR_DEPARTMENT_OTHER): Payer: Self-pay

## 2023-03-05 ENCOUNTER — Ambulatory Visit
Admission: RE | Admit: 2023-03-05 | Discharge: 2023-03-05 | Disposition: A | Discharge status: Home / Self Care | Payer: Commercial Managed Care - PPO | Source: Ambulatory Visit | Attending: Nurse Practitioner | Admitting: Nurse Practitioner

## 2023-03-05 ENCOUNTER — Ambulatory Visit (INDEPENDENT_AMBULATORY_CARE_PROVIDER_SITE_OTHER): Payer: Commercial Managed Care - PPO

## 2023-03-05 ENCOUNTER — Other Ambulatory Visit (HOSPITAL_BASED_OUTPATIENT_CLINIC_OR_DEPARTMENT_OTHER): Payer: Self-pay

## 2023-03-05 VITALS — BP 134/71 | HR 80 | Temp 98.5°F | Resp 17

## 2023-03-05 DIAGNOSIS — R051 Acute cough: Secondary | ICD-10-CM

## 2023-03-05 DIAGNOSIS — B349 Viral infection, unspecified: Secondary | ICD-10-CM | POA: Insufficient documentation

## 2023-03-05 DIAGNOSIS — R059 Cough, unspecified: Secondary | ICD-10-CM | POA: Diagnosis not present

## 2023-03-05 DIAGNOSIS — Z1152 Encounter for screening for COVID-19: Secondary | ICD-10-CM | POA: Diagnosis not present

## 2023-03-05 MED ORDER — PROMETHAZINE-DM 6.25-15 MG/5ML PO SYRP
5.0000 mL | ORAL_SOLUTION | Freq: Four times a day (QID) | ORAL | 0 refills | Status: DC | PRN
Start: 2023-03-05 — End: 2023-11-11
  Filled 2023-03-05: qty 118, 6d supply, fill #0

## 2023-03-05 MED ORDER — PREDNISONE 20 MG PO TABS
40.0000 mg | ORAL_TABLET | Freq: Every day | ORAL | 0 refills | Status: AC
Start: 2023-03-05 — End: 2023-03-11
  Filled 2023-03-05: qty 10, 5d supply, fill #0

## 2023-03-05 NOTE — ED Triage Notes (Signed)
Pt presents with c/o a productive cough. States she was taking her pills last night and states a few of the pills went into her lungs. Pt states she is concerned she might have pneumonia.

## 2023-03-05 NOTE — Discharge Instructions (Signed)
Your chest x-ray was negative for pneumonia.  The clinical contact you with results of your COVID test if it is positive.  You may start prednisone daily for 5 days.  Promethazine DM as needed for cough.  Please note this medication can make you drowsy.  Do not drink alcohol or drive while on this medication.  Lots of rest and fluids.  Please follow-up with your PCP if your symptoms or not improving.  Please go to the emergency room if you develop any worsening symptoms.  I hope you feel better soon!

## 2023-03-05 NOTE — ED Provider Notes (Addendum)
[facility]    CSN: [id number] Arrival date & time: 03/05/23  1249      History   Chief Complaint Chief Complaint  Patient presents with   Cough    Aspirated metoprolol and amlodipine last night; continual cough since then; wet and productive - Entered by patient    HPI Ann Peterson is a 57 y.o. female  presents for evaluation of URI symptoms for 1 days. Patient reports associated symptoms of productive cough. Denies N/V/D, fevers, ear pain, congestion, sore throat, body aches, shortness of breath. Patient does not have a hx of asthma or smoking. No known sick contacts.  Pt has taken nothing OTC for symptoms.  Patient states last night she took her metoprolol and amlodipine medication and choked.  She states when she took a deep breath to cough it out she feels like she inhaled the pills and has since had a cough.  Reports at 1 point she did cough heavily and thinks she saw portions of the pill in her sputum.  She states she has had pneumonia in the past and is concerned for this.  Pt has no other concerns at this time.    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   COVID-19 12/03/2019   body aches, joint pain, fever 104 x 4 days, then symptoms resolved   History of kidney stones    Hyperlipidemia    Hypertension    PONV (postoperative nausea and vomiting)    n/v after appendectomy 5-6 yrs ago   Sleep apnea    uses cpap   Thyroid disease    thyroidectomy due to cancer    Patient Active Problem List   Diagnosis Date Noted   Hyperlipidemia associated with type 2 diabetes mellitus (HCC) 11/30/2022   Hepatic steatosis 08/31/2022   Aortic atherosclerosis (HCC) 08/31/2022   Hypertension associated with diabetes (HCC) 08/15/2022   New onset type 2 diabetes mellitus (HCC) 08/15/2022   Morbid obesity (HCC) 08/15/2022   Low back pain 04/30/2022   Hydronephrosis concurrent with and due to calculi of kidney and  ureter 12/03/2019   Dysuria 01/19/2017   Enlarged uterus 01/19/2017   Menorrhagia 01/19/2017   ADD (attention deficit disorder) 12/14/2010   Hypothyroid 12/14/2010    Past Surgical History:  Procedure Laterality Date   ABDOMINAL HYSTERECTOMY  09/12/2016   partial   APPENDECTOMY  2011   BACK SURGERY     CESAREAN SECTION     x 3   CHOLECYSTECTOMY  2000   laproscopic   CYSTOSCOPY W/ URETERAL STENT PLACEMENT N/A 12/03/2019   Procedure: CYSTOSCOPY WITH STENT PLACEMENT, RETROGRADE;  Surgeon: Malen Gauze, MD;  Location: [facility];  Service: Urology;  Laterality: N/A;   CYSTOSCOPY WITH RETROGRADE PYELOGRAM, URETEROSCOPY AND STENT PLACEMENT Left 12/24/2019   Procedure: CYSTOSCOPY WITH RETROGRADE PYELOGRAM, URETEROSCOPY, STENT REMOVAL AND STONE EXTRACTION;  Surgeon: Malen Gauze, MD;  Location: [facility];  Service: Urology;  Laterality: Left;   CYSTOSCOPY WITH RETROGRADE PYELOGRAM, URETEROSCOPY AND STENT PLACEMENT Bilateral 05/04/2021   Procedure: CYSTOSCOPY WITH RETROGRADE PYELOGRAM, URETEROSCOPY AND STENT PLACEMENT;  Surgeon: Malen Gauze, MD;  Location: [facility];  Service: Urology;  Laterality: Bilateral;   discectomy     HOLMIUM LASER APPLICATION Bilateral 05/04/2021   Procedure: HOLMIUM LASER APPLICATION;  Surgeon: Malen Gauze, MD;  Location: [facility];  Service: Urology;  Laterality: Bilateral;   TOTAL THYROIDECTOMY  2000  sx and radiation done    OB History   No obstetric history on file.      Home Medications    Prior to Admission medications   Medication Sig Start Date End Date Taking? Authorizing Provider  predniSONE (DELTASONE) 20 MG tablet Take 2 tablets (40 mg total) by mouth daily with breakfast for 5 days. 03/05/23 03/10/23 Yes Radford Pax, NP  promethazine-dextromethorphan (PROMETHAZINE-DM) 6.25-15 MG/5ML syrup Take 5 mLs by mouth 4 (four) times daily as needed for cough. 03/05/23  Yes Radford Pax, NP  albuterol (VENTOLIN HFA)  108 (90 Base) MCG/ACT inhaler Inhale 2 puffs into the lungs every 6 (six) hours as needed for wheezing or shortness of breath. 03/20/21   Gabriel Earing, FNP  amLODipine (NORVASC) 10 MG tablet Take 1 tablet (10 mg total) by mouth daily. 05/15/22   Delynn Flavin M, DO  BIOTIN PO Take 1 capsule by mouth every evening.    [provider]  blood glucose meter kit and supplies Use up to four times daily as directed. 08/15/22   Raliegh Ip, DO  celecoxib (CELEBREX) 50 MG capsule Take 1 capsule (50 mg total) by mouth 2 (two) times daily as needed for pain. 02/08/23   Raliegh Ip, DO  Cholecalciferol (VITAMIN D) 50 MCG (2000 UT) CAPS Take 1 capsule by mouth every evening.    [provider]  Continuous Blood Gluc Sensor (FREESTYLE LIBRE 3 SENSOR) MISC Place 1 sensor on the skin every 14 days. Use to check glucose continuously E11.9 11/30/22   Delynn Flavin M, DO  cyclobenzaprine (FLEXERIL) 10 MG tablet Take 1 tablet (10 mg total) by mouth 3 (three) times daily as needed for muscle spasms 02/08/23     fluticasone (FLONASE) 50 MCG/ACT nasal spray Place 2 sprays into both nostrils daily. 01/23/23   Raliegh Ip, DO  gabapentin (NEURONTIN) 300 MG capsule Take 1 capsule (300 mg total) by mouth 3 (three) times daily. 11/02/22     glucose blood test strip Use as instructed to check blood sugar daily. 08/15/22   Raliegh Ip, DO  HYDROcodone-acetaminophen (NORCO/VICODIN) 5-325 MG tablet Take 1 tablet by mouth every 4 (four) hours as needed for moderate pain. 08/25/22 08/25/23  Elson Areas, PA-C  HYDROcodone-acetaminophen (NORCO/VICODIN) 5-325 MG tablet Take 1 tablet by mouth every 6 (six) hours as needed for pain 10/31/22     HYDROcodone-acetaminophen (NORCO/VICODIN) 5-325 MG tablet Take 1 tablet by mouth every 4 (four) hours as needed for pain 11/21/22     indapamide (LOZOL) 2.5 MG tablet TAKE 1 TABLET BY MOUTH EVERY DAY 05/29/22   McKenzie, Mardene Celeste, MD  Lancets  (FREESTYLE) lancets Test blood glucose once daily as directed. 08/15/22   Raliegh Ip, DO  levothyroxine (SYNTHROID) 175 MCG tablet Take 1 tablet (175 mcg total) by mouth daily. 05/15/22   Raliegh Ip, DO  magnesium oxide (MAG-OX) 400 MG tablet Take 400 mg by mouth daily.    [provider]  metoprolol succinate (TOPROL-XL) 100 MG 24 hr tablet Take 1 tablet (100 mg total) by mouth daily. With or immediately following a meal 05/15/22   Delynn Flavin M, DO  ondansetron (ZOFRAN) 4 MG tablet Take 1 tablet (4 mg total) by mouth daily as needed for nausea or vomiting. 08/25/22 08/25/23  Elson Areas, PA-C  potassium chloride (KLOR-CON M) 10 MEQ tablet Take 1 tablet (10 mEq total) by mouth 2 (two) times daily. 08/25/22   Elson Areas,  PA-C  tirzepatide [facility]) 2.5 MG/0.5ML Pen Inject 2.5 mg into the skin once a week. 02/11/23   Raliegh Ip, DO  tiZANidine (ZANAFLEX) 4 MG tablet Take 1 tablet (4 mg total) by mouth every 6 (six) hours as needed for muscle spasms. 10/26/22   Raliegh Ip, DO  traZODone (DESYREL) 50 MG tablet Take 0.5-2 tablets (25-100 mg total) by mouth at bedtime as needed for sleep. 05/15/22   Raliegh Ip, DO    Family History Family History  Problem Relation Age of Onset   Hypertension Mother    Dementia Mother    Macular degeneration Mother    Stroke Mother    COPD Father        lung cancer   Cancer Brother        colon cancer   Cancer Brother        brain tumor    Social History Social History   Tobacco Use   Smoking status: Never   Smokeless tobacco: Never  Vaping Use   Vaping Use: Never used  Substance Use Topics   Alcohol use: No   Drug use: No     Allergies   Pollen extract   Review of Systems Review of Systems  Respiratory:  Positive for cough.      Physical Exam Triage Vital Signs ED Triage Vitals  Enc Vitals Group     BP 03/05/23 1302 134/71     Pulse Rate 03/05/23 1307 80     Resp 03/05/23  1302 17     Temp 03/05/23 1302 98.5 F (36.9 C)     Temp Source 03/05/23 1302 Oral     SpO2 03/05/23 1307 95 %     Weight --      Height --      Head Circumference --      Peak Flow --      Pain Score 03/05/23 1301 0     Pain Loc --      Pain Edu? --      Excl. in GC? --    No data found.  Updated Vital Signs BP 134/71 (BP Location: Right Arm)   Pulse 80   Temp 98.5 F (36.9 C) (Oral)   Resp 17   LMP 09/07/2016 (Exact Date)   SpO2 95%   Visual Acuity Right Eye Distance:   Left Eye Distance:   Bilateral Distance:    Right Eye Near:   Left Eye Near:    Bilateral Near:     Physical Exam Vitals and nursing note reviewed.  Constitutional:      General: She is not in acute distress.    Appearance: Normal appearance. She is well-developed. She is not ill-appearing.  HENT:     Head: Normocephalic and atraumatic.     Right Ear: Tympanic membrane and ear canal normal.     Left Ear: Tympanic membrane and ear canal normal.     Nose: No congestion or rhinorrhea.     Mouth/Throat:     Mouth: Mucous membranes are moist.     Pharynx: Oropharynx is clear. Uvula midline. Posterior oropharyngeal erythema present.     Tonsils: No tonsillar exudate or tonsillar abscesses.  Eyes:     Conjunctiva/sclera: Conjunctivae normal.     Pupils: Pupils are equal, round, and reactive to light.  Cardiovascular:     Rate and Rhythm: Normal rate and regular rhythm.     Heart sounds: Normal heart sounds.  Pulmonary:  Effort: Pulmonary effort is normal.     Breath sounds: Normal breath sounds.  Musculoskeletal:     Cervical back: Normal range of motion and neck supple.  Lymphadenopathy:     Cervical: No cervical adenopathy.  Skin:    General: Skin is warm and dry.  Neurological:     General: No focal deficit present.     Mental Status: She is alert and oriented to person, place, and time.  Psychiatric:        Mood and Affect: Mood normal.        Behavior: Behavior normal.       UC Treatments / Results  Labs (all labs ordered are listed, but only abnormal results are displayed) Labs Reviewed  SARS CORONAVIRUS 2 (TAT 6-24 HRS)   CMP14+EGFR Order: [id number] Status: Final result     Visible to patient: Yes (seen)     Next appt: None     Dx: New onset type 2 diabetes mellitus (H...   2 Result Notes     1 Patient Communication     1 HM Topic          Component Ref Range & Units 3 mo ago (11/30/22) 6 mo ago (08/31/22) 6 mo ago (08/24/22) 6 mo ago (08/15/22) 9 mo ago (05/15/22) 1 yr ago (04/04/21) 1 yr ago (04/04/21)  Glucose 70 - 99 mg/dL 86 951 High  884 High  CM 130 High  239 High   110 High  R  BUN 6 - 24 mg/dL 21 11 15  R 15 10  19   Creatinine, Ser 0.57 - 1.00 mg/dL 1.66 0.63 0.16 R 0.10 9.32  0.68  eGFR >59 mL/min/1.73 88 78  83 103  103  BUN/Creatinine Ratio 9 - 23 27 High  13  18 15  28  High   Sodium 134 - 144 mmol/L 140 142 136 R 137 137  142  Potassium 3.5 - 5.2 mmol/L 4.2 3.9 2.8 Low  R 3.8 3.4 Low   4.7  Chloride 96 - 106 mmol/L 96 97 97 Low  R 97 94 Low   98  CO2 20 - 29 mmol/L 27 31 High  29 R 25 28  26   Calcium 8.7 - 10.2 mg/dL 9.7 35.5 9.0 R 9.2 9.8 9.8 10.2  Total Protein 6.0 - 8.5 g/dL 7.1 7.4 8.3 High  R 6.6 6.7    Albumin 3.8 - 4.9 g/dL 4.6 4.9 4.7 R 4.4 4.5    Globulin, Total 1.5 - 4.5 g/dL 2.5 2.5  2.2 2.2    Albumin/Globulin Ratio 1.2 - 2.2 1.8 2.0  2.0 2.0    Bilirubin Total 0.0 - 1.2 mg/dL 0.3 0.6 0.7 R 0.4 0.4    Alkaline Phosphatase 44 - 121 IU/L 74 92 85 R 92 86    AST 0 - 40 IU/L 26 40 40 R 25 43 High     ALT 0 - 32 IU/L 41 High  62 High  62 High  R 44 High  73 High     Resulting Agency [facility]         Narrative Performed by: Verdell Carmine Performed at:  [address] [facility] [address], [place], [place]  [id number] Lab Director: Jolene Schimke MD, Phone:  [phone]        EKG   Radiology DG Chest 2 View  Result Date:  03/05/2023 CLINICAL DATA:  thinks she aspirated some BP pills last night and has since  had a cough EXAM: CHEST - 2 VIEW COMPARISON:  Chest x-ray May 6, 24. FINDINGS: Similar linear mild subsegmental atelectasis or scar in the lingula. No consolidation. No visible pleural effusions or pneumothorax. Cardiomediastinal silhouette is unchanged. ACDF. IMPRESSION: No active cardiopulmonary disease. Electronically Signed   By: Feliberto Harts M.D.   On: 03/05/2023 13:36    Procedures Procedures (including critical care time)  Medications Ordered in UC Medications - No data to display  Initial Impression / Assessment and Plan / UC Course  I have reviewed the triage vital signs and the nursing notes.  Pertinent labs & imaging results that were available during my care of the patient were reviewed by me and considered in my medical decision making (see chart for details).     Reviewed exam and symptoms with patient.  No red flags.  Chest x-ray negative for pneumonia.  COVID PCR and will contact if positive.  Discussed viral illness and symptomatic treatment.  Patient requested prednisone, Rx sent to pharmacy.  Promethazine DM as needed for cough.  Side effect profile reviewed.  Rest and fluids.  PCP follow-up if symptoms do not improve.  ER precautions reviewed and patient verbalized understanding. Final Clinical Impressions(s) / UC Diagnoses   Final diagnoses:  Acute cough  Viral illness     Discharge Instructions      Your chest x-ray was negative for pneumonia.  The clinical contact you with results of your COVID test if it is positive.  You may start prednisone daily for 5 days.  Promethazine DM as needed for cough.  Please note this medication can make you drowsy.  Do not drink alcohol or drive while on this medication.  Lots of rest and fluids.  Please follow-up with your PCP if your symptoms or not improving.  Please go to the emergency room if you develop any worsening symptoms.  I hope you  feel better soon!     ED Prescriptions     Medication Sig Dispense Auth. Provider   predniSONE (DELTASONE) 20 MG tablet Take 2 tablets (40 mg total) by mouth daily with breakfast for 5 days. 10 tablet Radford Pax, NP   promethazine-dextromethorphan (PROMETHAZINE-DM) 6.25-15 MG/5ML syrup Take 5 mLs by mouth 4 (four) times daily as needed for cough. 118 mL Radford Pax, NP      PDMP not reviewed this encounter.   Radford Pax, NP 03/05/23 1346    Radford Pax, NP 03/05/23 [phone]

## 2023-03-06 LAB — SARS CORONAVIRUS 2 (TAT 6-24 HRS): SARS Coronavirus 2: NEGATIVE

## 2023-03-29 ENCOUNTER — Other Ambulatory Visit (HOSPITAL_BASED_OUTPATIENT_CLINIC_OR_DEPARTMENT_OTHER): Payer: Self-pay

## 2023-03-29 ENCOUNTER — Other Ambulatory Visit: Payer: Self-pay | Admitting: Family Medicine

## 2023-03-29 ENCOUNTER — Encounter: Payer: Self-pay | Admitting: Family Medicine

## 2023-03-29 DIAGNOSIS — E119 Type 2 diabetes mellitus without complications: Secondary | ICD-10-CM

## 2023-03-29 DIAGNOSIS — E1169 Type 2 diabetes mellitus with other specified complication: Secondary | ICD-10-CM

## 2023-03-29 MED ORDER — TIRZEPATIDE 5 MG/0.5ML ~~LOC~~ SOAJ
5.0000 mg | SUBCUTANEOUS | 0 refills | Status: DC
Start: 2023-03-29 — End: 2023-04-26
  Filled 2023-03-29 – 2023-03-30 (×2): qty 2, 28d supply, fill #0
  Filled 2023-04-26: qty 2, 28d supply, fill #1

## 2023-03-30 ENCOUNTER — Other Ambulatory Visit (HOSPITAL_BASED_OUTPATIENT_CLINIC_OR_DEPARTMENT_OTHER): Payer: Self-pay

## 2023-04-22 ENCOUNTER — Encounter: Payer: Self-pay | Admitting: Family Medicine

## 2023-04-24 ENCOUNTER — Encounter: Payer: Self-pay | Admitting: Family Medicine

## 2023-04-26 ENCOUNTER — Other Ambulatory Visit (HOSPITAL_COMMUNITY): Payer: Self-pay

## 2023-04-26 ENCOUNTER — Other Ambulatory Visit (HOSPITAL_BASED_OUTPATIENT_CLINIC_OR_DEPARTMENT_OTHER): Payer: Self-pay

## 2023-04-26 ENCOUNTER — Other Ambulatory Visit: Payer: Self-pay | Admitting: Family Medicine

## 2023-04-26 ENCOUNTER — Other Ambulatory Visit: Payer: Self-pay

## 2023-04-26 DIAGNOSIS — E1169 Type 2 diabetes mellitus with other specified complication: Secondary | ICD-10-CM

## 2023-04-26 DIAGNOSIS — I10 Essential (primary) hypertension: Secondary | ICD-10-CM

## 2023-04-26 MED ORDER — TIRZEPATIDE 7.5 MG/0.5ML ~~LOC~~ SOAJ
7.5000 mg | SUBCUTANEOUS | 0 refills | Status: DC
Start: 2023-04-26 — End: 2023-09-13
  Filled 2023-04-26 (×2): qty 2, 28d supply, fill #0

## 2023-04-26 MED ORDER — METOPROLOL SUCCINATE ER 100 MG PO TB24
100.0000 mg | ORAL_TABLET | Freq: Every day | ORAL | 0 refills | Status: DC
Start: 2023-04-26 — End: 2023-06-24
  Filled 2023-04-26: qty 30, 30d supply, fill #0

## 2023-04-26 MED ORDER — TIRZEPATIDE 10 MG/0.5ML ~~LOC~~ SOAJ
10.0000 mg | SUBCUTANEOUS | 0 refills | Status: DC
Start: 2023-04-26 — End: 2023-06-24
  Filled 2023-04-26 – 2023-05-22 (×3): qty 2, 28d supply, fill #0

## 2023-05-17 ENCOUNTER — Telehealth: Payer: Self-pay | Admitting: Family Medicine

## 2023-05-22 ENCOUNTER — Other Ambulatory Visit (HOSPITAL_COMMUNITY): Payer: Self-pay

## 2023-05-22 ENCOUNTER — Other Ambulatory Visit: Payer: Self-pay

## 2023-05-23 ENCOUNTER — Other Ambulatory Visit (HOSPITAL_COMMUNITY): Payer: Self-pay

## 2023-05-23 ENCOUNTER — Other Ambulatory Visit: Payer: Self-pay | Admitting: Urology

## 2023-05-23 ENCOUNTER — Other Ambulatory Visit: Payer: Self-pay | Admitting: Family Medicine

## 2023-05-23 ENCOUNTER — Other Ambulatory Visit: Payer: Self-pay

## 2023-05-23 DIAGNOSIS — I10 Essential (primary) hypertension: Secondary | ICD-10-CM

## 2023-05-23 DIAGNOSIS — E890 Postprocedural hypothyroidism: Secondary | ICD-10-CM

## 2023-05-23 MED ORDER — LEVOTHYROXINE SODIUM 175 MCG PO TABS
175.0000 ug | ORAL_TABLET | Freq: Every day | ORAL | 0 refills | Status: DC
Start: 2023-05-23 — End: 2023-06-24
  Filled 2023-05-23: qty 30, 30d supply, fill #0

## 2023-05-23 MED ORDER — AMLODIPINE BESYLATE 10 MG PO TABS
10.0000 mg | ORAL_TABLET | Freq: Every day | ORAL | 0 refills | Status: DC
Start: 2023-05-23 — End: 2023-06-24
  Filled 2023-05-23: qty 30, 30d supply, fill #0

## 2023-05-24 ENCOUNTER — Other Ambulatory Visit (HOSPITAL_COMMUNITY): Payer: Self-pay

## 2023-05-27 ENCOUNTER — Other Ambulatory Visit (HOSPITAL_COMMUNITY): Payer: Self-pay

## 2023-05-27 ENCOUNTER — Other Ambulatory Visit: Payer: Self-pay

## 2023-05-27 MED ORDER — INDAPAMIDE 2.5 MG PO TABS
2.5000 mg | ORAL_TABLET | Freq: Every day | ORAL | 3 refills | Status: DC
  Filled 2023-05-27: qty 90, 90d supply, fill #0

## 2023-06-06 ENCOUNTER — Telehealth: Payer: Self-pay

## 2023-06-06 ENCOUNTER — Telehealth: Payer: Self-pay | Admitting: Urology

## 2023-06-06 ENCOUNTER — Ambulatory Visit (HOSPITAL_COMMUNITY)
Admission: RE | Admit: 2023-06-06 | Discharge: 2023-06-06 | Disposition: A | Discharge status: Home / Self Care | Payer: Commercial Managed Care - PPO | Source: Ambulatory Visit | Attending: Urology | Admitting: Urology

## 2023-06-06 ENCOUNTER — Other Ambulatory Visit: Payer: Self-pay | Admitting: Urology

## 2023-06-06 DIAGNOSIS — K5900 Constipation, unspecified: Secondary | ICD-10-CM | POA: Diagnosis not present

## 2023-06-06 DIAGNOSIS — N200 Calculus of kidney: Secondary | ICD-10-CM | POA: Insufficient documentation

## 2023-06-06 DIAGNOSIS — R109 Unspecified abdominal pain: Secondary | ICD-10-CM | POA: Diagnosis not present

## 2023-06-06 DIAGNOSIS — M549 Dorsalgia, unspecified: Secondary | ICD-10-CM | POA: Diagnosis not present

## 2023-06-06 NOTE — Telephone Encounter (Signed)
Patien tis made aware "pt scheduled for tomorrow for possible stone. KUB ordered - please advise her to get that done before her visit. thanks!" Patient voiced understanding.

## 2023-06-06 NOTE — Telephone Encounter (Signed)
Patient called stating she is having increased urination,  lower left abdomin pain and back pain, has a hx of stones, wants to drop off urine sample.    Scheduled pt with Maralyn Sago 06/07/23

## 2023-06-06 NOTE — Progress Notes (Signed)
Name: Ann Peterson DOB: Sep 16, 1965 MRN: [id number]  History of Present Illness: Ann Peterson is a 57 y.o. female who presents today for follow up visit at [facility]. - GU History: 1. Kidney stones (recurrent). - Has had prior ureteroscopic stone manipulation x2 by Dr. Ronne Peterson.   At last visit with Dr. Ronne Peterson on 06/05/2021: - "24 hour urine showed calcium 400, borderline elevated uric acid but she was on Lippert diet." - The plan was:  1. 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 2. RTC 3 months with repeat 24 hour urine.  Since last visit: 08/24/2022: CT abdomen/pelvis w/ contrast showed a 3 mm left kidney stone and a punctate right kidney stone.  Today: KUB today: Awaiting radiology read; appears to have bilateral UVJ stones.  She reports left low back pain and bilateral abdominal pain (L > R) x3-4 days with increased urinary urgency, frequency, and hesitancy. She denies recent stone passage. She denies dysuria, gross hematuria, hesitancy, straining to void, or sensations of incomplete emptying. She reports intermittent nausea; denies fevers or vomiting.   Fall Screening: Do you usually have a device to assist in your mobility? No   Medications: Current Outpatient Medications  Medication Sig Dispense Refill   albuterol (VENTOLIN HFA) 108 (90 Base) MCG/ACT inhaler Inhale 2 puffs into the lungs every 6 (six) hours as needed for wheezing or shortness of breath. 8 g 2   amLODipine (NORVASC) 10 MG tablet Take 1 tablet (10 mg total) by mouth daily. **NEEDS TO BE SEEN BEFORE NEXT REFILL** 30 tablet 0   BIOTIN PO Take 1 capsule by mouth every evening.     blood glucose meter kit and supplies Use up to four times daily as directed. 1 each 0   celecoxib (CELEBREX) 50 MG capsule Take 1 capsule (50 mg total) by mouth 2 (two) times daily as needed for pain. 180 capsule PRN   Cholecalciferol (VITAMIN D) 50 MCG (2000 UT) CAPS Take 1 capsule by mouth every evening.      Continuous Blood Gluc Sensor (FREESTYLE LIBRE 3 SENSOR) MISC Place 1 sensor on the skin every 14 days. Use to check glucose continuously E11.9 6 each 4   cyclobenzaprine (FLEXERIL) 10 MG tablet Take 1 tablet (10 mg total) by mouth 3 (three) times daily as needed for muscle spasms 60 tablet 0   fluticasone (FLONASE) 50 MCG/ACT nasal spray Place 2 sprays into both nostrils daily. 48 g 1   gabapentin (NEURONTIN) 300 MG capsule Take 1 capsule (300 mg total) by mouth 3 (three) times daily. 90 capsule 0   glucose blood test strip Use as instructed to check blood sugar daily. 100 each PRN   Lancets (FREESTYLE) lancets Test blood glucose once daily as directed. 100 each PRN   levothyroxine (SYNTHROID) 175 MCG tablet Take 1 tablet (175 mcg total) by mouth daily. **NEEDS TO BE SEEN BEFORE NEXT REFILL** 30 tablet 0   magnesium oxide (MAG-OX) 400 MG tablet Take 400 mg by mouth daily.     metoprolol succinate (TOPROL-XL) 100 MG 24 hr tablet Take 1 tablet (100 mg total) by mouth daily. With or immediately following a meal **NEEDS TO BE SEEN BEFORE NEXT REFILL** 30 tablet 0   ondansetron (ZOFRAN-ODT) 4 MG disintegrating tablet Take 1 tablet (4 mg total) by mouth every 8 (eight) hours as needed for nausea or vomiting. 20 tablet 1   oxyCODONE-acetaminophen (PERCOCET/ROXICET) 5-325 MG tablet Take 1 tablet by mouth every 6 (six) hours as needed for up to  5 days for severe pain. 20 tablet 0   potassium chloride (KLOR-CON M) 10 MEQ tablet Take 1 tablet (10 mEq total) by mouth 2 (two) times daily. 30 tablet 0   promethazine-dextromethorphan (PROMETHAZINE-DM) 6.25-15 MG/5ML syrup Take 5 mLs by mouth 4 (four) times daily as needed for cough. 118 mL 0   tamsulosin (FLOMAX) 0.4 MG CAPS capsule Take 1 capsule (0.4 mg total) by mouth daily. 30 capsule 0   tirzepatide (MOUNJARO) 10 MG/0.5ML Pen Inject 10 mg into the skin once a week. 2 mL 0   tirzepatide (MOUNJARO) 7.5 MG/0.5ML Pen Inject 7.5 mg into the skin once a week. 2 mL  0   tiZANidine (ZANAFLEX) 4 MG tablet Take 1 tablet (4 mg total) by mouth every 6 (six) hours as needed for muscle spasms. 30 tablet 1   traZODone (DESYREL) 50 MG tablet Take 0.5-2 tablets (25-100 mg total) by mouth at bedtime as needed for sleep. 180 tablet 3   indapamide (LOZOL) 2.5 MG tablet TAKE 1 TABLET BY MOUTH EVERY DAY 90 tablet 3   No current facility-administered medications for this visit.    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   COVID-19 12/03/2019   body aches, joint pain, fever 104 x 4 days, then symptoms resolved   History of kidney stones    Hyperlipidemia    Hypertension    PONV (postoperative nausea and vomiting)    n/v after appendectomy 5-6 yrs ago   Sleep apnea    uses cpap   Thyroid disease    thyroidectomy due to cancer   Past Surgical History:  Procedure Laterality Date   ABDOMINAL HYSTERECTOMY  09/12/2016   partial   APPENDECTOMY  2011   BACK SURGERY     CESAREAN SECTION     x 3   CHOLECYSTECTOMY  2000   laproscopic   CYSTOSCOPY W/ URETERAL STENT PLACEMENT N/A 12/03/2019   Procedure: CYSTOSCOPY WITH STENT PLACEMENT, RETROGRADE;  Surgeon: Ann Gauze, MD;  Location: [facility];  Service: Urology;  Laterality: N/A;   CYSTOSCOPY WITH RETROGRADE PYELOGRAM, URETEROSCOPY AND STENT PLACEMENT Left 12/24/2019   Procedure: CYSTOSCOPY WITH RETROGRADE PYELOGRAM, URETEROSCOPY, STENT REMOVAL AND STONE EXTRACTION;  Surgeon: Ann Gauze, MD;  Location: [facility];  Service: Urology;  Laterality: Left;   CYSTOSCOPY WITH RETROGRADE PYELOGRAM, URETEROSCOPY AND STENT PLACEMENT Bilateral 05/04/2021   Procedure: CYSTOSCOPY WITH RETROGRADE PYELOGRAM, URETEROSCOPY AND STENT PLACEMENT;  Surgeon: Ann Gauze, MD;  Location: [facility];  Service: Urology;  Laterality: Bilateral;   discectomy     HOLMIUM LASER APPLICATION  Bilateral 05/04/2021   Procedure: HOLMIUM LASER APPLICATION;  Surgeon: Ann Gauze, MD;  Location: [facility];  Service: Urology;  Laterality: Bilateral;   TOTAL THYROIDECTOMY  2000   sx and radiation done   Family History  Problem Relation Age of Onset   Hypertension Mother    Dementia Mother    Macular degeneration Mother    Stroke Mother    COPD Father        lung cancer   Cancer Brother        colon cancer   Cancer Brother        brain tumor   Social History   Socioeconomic History   Marital status: Widowed    Spouse name: Not on file   Number of children: Not on file  Years of education: Not on file   Highest education level: Associate degree: academic program  Occupational History   Not on file  Tobacco Use   Smoking status: Never   Smokeless tobacco: Never  Vaping Use   Vaping status: Never Used  Substance and Sexual Activity   Alcohol use: No   Drug use: No   Sexual activity: Not Currently    Birth control/protection: Surgical  Other Topics Concern   Not on file  Social History Narrative   Not on file   Social Determinants of Health   Financial Resource Strain: Low Risk  (11/29/2022)   Overall Financial Resource Strain (CARDIA)    Difficulty of Paying Living Expenses: Not hard at all  Food Insecurity: No Food Insecurity (11/29/2022)   Hunger Vital Sign    Worried About Running Out of Food in the Last Year: Never true    Ran Out of Food in the Last Year: Never true  Transportation Needs: No Transportation Needs (11/29/2022)   PRAPARE - Administrator, Civil Service (Medical): No    Lack of Transportation (Non-Medical): No  Physical Activity: Insufficiently Active (11/29/2022)   Exercise Vital Sign    Days of Exercise per Week: 4 days    Minutes of Exercise per Session: 30 min  Stress: No Stress Concern Present (11/29/2022)   [employer] of Occupational Health - Occupational Stress Questionnaire    Feeling of Stress : Not at all   Social Connections: Moderately Integrated (11/29/2022)   Social Connection and Isolation Panel [NHANES]    Frequency of Communication with Friends and Family: More than three times a week    Frequency of Social Gatherings with Friends and Family: Three times a week    Attends Religious Services: More than 4 times per year    Active Member of Clubs or Organizations: Yes    Attends Banker Meetings: More than 4 times per year    Marital Status: Widowed  Catering manager Violence: Not on file    SUBJECTIVE  Review of Systems Constitutional: Patient denies any unintentional weight loss or change in strength lntegumentary: Patient denies any rashes or pruritus Cardiovascular: Patient denies chest pain or syncope Respiratory: Patient denies shortness of breath Gastrointestinal: Patient denies vomiting, constipation, or diarrhea Musculoskeletal: Patient denies muscle cramps or weakness Neurologic: Patient denies convulsions or seizures Psychiatric: Patient denies memory problems Allergic/Immunologic: Patient denies recent allergic reaction(s) Hematologic/Lymphatic: Patient denies bleeding tendencies Endocrine: Patient denies heat/cold intolerance  GU: As per HPI.  OBJECTIVE Vitals:   06/07/23 1347  BP: 124/79  Pulse: 96  Temp: 98 F (36.7 C)   There is no height or weight on file to calculate BMI.  Physical Examination Constitutional: No obvious distress; patient is non-toxic appearing  Cardiovascular: No visible lower extremity edema.  Respiratory: The patient does not have audible wheezing/stridor; respirations do not appear labored  Gastrointestinal: Abdomen non-distended Musculoskeletal: Normal ROM of UEs  Skin: No obvious rashes/open sores  Neurologic: CN 2-12 grossly intact Psychiatric: Answered questions appropriately with normal affect  Hematologic/Lymphatic/Immunologic: No obvious bruises or sites of spontaneous bleeding  UA: no evidence of UTI or  microscopic hematuria  ASSESSMENT LLQ pain - Plan: Urinalysis, Routine w reflex microscopic, oxyCODONE-acetaminophen (PERCOCET/ROXICET) 5-325 MG tablet, tamsulosin (FLOMAX) 0.4 MG CAPS capsule, ondansetron (ZOFRAN-ODT) 4 MG disintegrating tablet, DG Abd 1 View  Kidney stones - Plan: Urinalysis, Routine w reflex microscopic, indapamide (LOZOL) 2.5 MG tablet, oxyCODONE-acetaminophen (PERCOCET/ROXICET) 5-325 MG tablet, tamsulosin (FLOMAX) 0.4 MG CAPS  capsule, ondansetron (ZOFRAN-ODT) 4 MG disintegrating tablet, DG Abd 1 View  Lower urinary tract symptoms (LUTS) - Plan: oxyCODONE-acetaminophen (PERCOCET/ROXICET) 5-325 MG tablet, tamsulosin (FLOMAX) 0.4 MG CAPS capsule, ondansetron (ZOFRAN-ODT) 4 MG disintegrating tablet, DG Abd 1 View  Nausea - Plan: ondansetron (ZOFRAN-ODT) 4 MG disintegrating tablet, DG Abd 1 View  We reviewed recent imaging results; awaiting radiology results, appears to have bilateral UVJ stones. Based on stone characteristics, likely to pass with medical expulsive therapy. Flomax 0.4 mg daily x2 weeks prescribed. For pain management, a 5 day prescription was sent for Percocet for PRN use for severe pain. For nausea / vomiting, a prescription was sent for Zofran for PRN use.  She was advised to contact urology provider or go to the ER if She develops fever >101F, uncontrollable pain, or other significantly concerning symptoms.  She verbalized understanding and agreement. All questions were answered.   PLAN Advised the following: Flomax daily x2 weeks. Analgesics PRN for pain. Zofran PRN for nausea. Return in about 2 weeks (around 06/21/2023) for KUB, UA, & f/u with Evette Georges NP.  Orders Placed This Encounter  Procedures   DG Abd 1 View    Standing Status:   Future    Standing Expiration Date:   06/06/2024    Order Specific Question:   Reason for Exam (SYMPTOM  OR DIAGNOSIS REQUIRED)    Answer:   kidney stone    Order Specific Question:   Is patient pregnant?     Answer:   No    Order Specific Question:   Preferred imaging location?    Answer:   [facility]   Urinalysis, Routine w reflex microscopic    It has been explained that the patient is to follow regularly with their PCP in addition to all other providers involved in their care and to follow instructions provided by these respective offices. Patient advised to contact urology clinic if any urologic-pertaining questions, concerns, new symptoms or problems arise in the interim period.  There are no Patient Instructions on file for this visit.  Electronically signed by:  Donnita Falls, MSN, FNP-C, CUNP 06/07/2023 2:23 PM

## 2023-06-07 ENCOUNTER — Encounter: Payer: Self-pay | Admitting: Urology

## 2023-06-07 ENCOUNTER — Ambulatory Visit: Payer: Commercial Managed Care - PPO | Admitting: Urology

## 2023-06-07 ENCOUNTER — Other Ambulatory Visit (HOSPITAL_COMMUNITY): Payer: Self-pay

## 2023-06-07 VITALS — BP 124/79 | HR 96 | Temp 98.0°F

## 2023-06-07 DIAGNOSIS — R110 Nausea: Secondary | ICD-10-CM

## 2023-06-07 DIAGNOSIS — Z87442 Personal history of urinary calculi: Secondary | ICD-10-CM | POA: Diagnosis not present

## 2023-06-07 DIAGNOSIS — R399 Unspecified symptoms and signs involving the genitourinary system: Secondary | ICD-10-CM | POA: Diagnosis not present

## 2023-06-07 DIAGNOSIS — M545 Low back pain, unspecified: Secondary | ICD-10-CM | POA: Diagnosis not present

## 2023-06-07 DIAGNOSIS — N200 Calculus of kidney: Secondary | ICD-10-CM

## 2023-06-07 DIAGNOSIS — R350 Frequency of micturition: Secondary | ICD-10-CM

## 2023-06-07 DIAGNOSIS — R1032 Left lower quadrant pain: Secondary | ICD-10-CM

## 2023-06-07 DIAGNOSIS — R300 Dysuria: Secondary | ICD-10-CM

## 2023-06-07 LAB — URINALYSIS, ROUTINE W REFLEX MICROSCOPIC
Bilirubin, UA: NEGATIVE
Glucose, UA: NEGATIVE
Ketones, UA: NEGATIVE
Nitrite, UA: NEGATIVE
Protein,UA: NEGATIVE
RBC, UA: NEGATIVE
Specific Gravity, UA: 1.02 (ref 1.005–1.030)
Urobilinogen, Ur: 0.2 mg/dL (ref 0.2–1.0)
pH, UA: 7 (ref 5.0–7.5)

## 2023-06-07 LAB — MICROSCOPIC EXAMINATION
Bacteria, UA: NONE SEEN
RBC, Urine: NONE SEEN /[HPF] (ref 0–2)

## 2023-06-07 MED ORDER — TAMSULOSIN HCL 0.4 MG PO CAPS
0.4000 mg | ORAL_CAPSULE | Freq: Every day | ORAL | 0 refills | Status: DC
Start: 2023-06-07 — End: 2023-07-01

## 2023-06-07 MED ORDER — INDAPAMIDE 2.5 MG PO TABS
2.5000 mg | ORAL_TABLET | Freq: Every day | ORAL | 3 refills | Status: DC
Start: 2023-06-07 — End: 2024-03-26

## 2023-06-07 MED ORDER — OXYCODONE-ACETAMINOPHEN 5-325 MG PO TABS
1.0000 | ORAL_TABLET | Freq: Four times a day (QID) | ORAL | 0 refills | Status: AC | PRN
Start: 2023-06-07 — End: 2023-06-12

## 2023-06-07 MED ORDER — ONDANSETRON 4 MG PO TBDP
4.0000 mg | ORAL_TABLET | Freq: Three times a day (TID) | ORAL | 1 refills | Status: DC | PRN
Start: 2023-06-07 — End: 2024-03-26

## 2023-06-17 DIAGNOSIS — N393 Stress incontinence (female) (male): Secondary | ICD-10-CM | POA: Insufficient documentation

## 2023-06-17 NOTE — Progress Notes (Signed)
Name: Ann Peterson DOB: 18-May-1966 MRN: [id number]  History of Present Illness: Ms. Ann Peterson is a 57 y.o. female who presents today for follow up visit at [facility]. - GU History: 1. Kidney stones (recurrent). - Has had prior ureteroscopic stone manipulation x2 by Dr. Ronne Binning.  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 2. Stress urinary incontinence.  Imaging:    - 08/24/2022: CT abdomen/pelvis w/ contrast showed a 3 mm left kidney stone and a punctate right kidney stone.   At last visit on 06/07/2023: - 06/06/2023: KUB performed; still awaiting formal radiology read. - Patient reported left low back pain and bilateral abdominal pain (L > R) x3-4 days with increased urinary urgency, frequency, and hesitancy. Intermittent nausea. - Advised medical expulsive therapy with Flomax daily x2 weeks then recheck with KUB.   Today: KUB today: Awaiting formal radiology read.   She denies recent stone passage. She reports left flank pain, LLQ abdominal pain, and intermittent nausea; denies vomiting. She reports low grade fevers (<100 F) and night sweats over the past few days.   She reports increased urinary urgency, frequency, and nocturia. Denies dysuria, gross hematuria, hesitancy, straining to void, or sensations of incomplete emptying.  She is currently taking Doxycycline for 30 day course due to recent dental procedure. Prior to that she was on Amoxicillin for 30 days.   She denies constipation but does note that her bowel movement have been more firm recently; reports having a bowel movement every other day typically. Denies diarrhea, rectal bleeding, or pain with defecation. Denies history of diverticulitis but states she was previously told she has diverticulosis and colon polyps.   Fall Screening: Do you usually have a device to assist in your mobility? No    Medications: Current Outpatient Medications  Medication Sig Dispense Refill   albuterol (VENTOLIN HFA) 108 (90 Base) MCG/ACT inhaler Inhale 2 puffs into the lungs every 6 (six) hours as needed for wheezing or shortness of breath. 8 g 2   amLODipine (NORVASC) 10 MG tablet Take 1 tablet (10 mg total) by mouth daily. **NEEDS TO BE SEEN BEFORE NEXT REFILL** 30 tablet 0   BIOTIN PO Take 1 capsule by mouth every evening.     blood glucose meter kit and supplies Use up to four times daily as directed. 1 each 0   celecoxib (CELEBREX) 50 MG capsule Take 1 capsule (50 mg total) by mouth 2 (two) times daily as needed for pain. 180 capsule PRN   Cholecalciferol (VITAMIN D) 50 MCG (2000 UT) CAPS Take 1 capsule by mouth every evening.     Continuous Blood Gluc Sensor (FREESTYLE LIBRE 3 SENSOR) MISC Place 1 sensor on the skin every 14 days. Use to check glucose continuously E11.9 6 each 4   cyclobenzaprine (FLEXERIL) 10 MG tablet Take 1 tablet (10 mg total) by mouth 3 (three) times daily as needed for muscle spasms 60 tablet 0   doxycycline (VIBRAMYCIN) 100 MG capsule Take 100 mg by mouth daily. She is taking this currently for 30 days due to dental work     fluticasone (FLONASE) 50 MCG/ACT nasal spray Place 2 sprays into both nostrils daily. 48 g 1   gabapentin (NEURONTIN) 300 MG capsule Take 1 capsule (300 mg total) by mouth 3 (three) times daily. 90 capsule 0   glucose blood test strip Use as instructed to check blood sugar daily.  100 each PRN   indapamide (LOZOL) 2.5 MG tablet TAKE 1 TABLET BY MOUTH EVERY DAY 90 tablet 3   Lancets (FREESTYLE) lancets Test blood glucose once daily as directed. 100 each PRN   levothyroxine (SYNTHROID) 175 MCG tablet Take 1 tablet (175 mcg total) by mouth daily. **NEEDS TO BE SEEN BEFORE NEXT REFILL** 30 tablet 0   magnesium oxide (MAG-OX) 400 MG tablet Take 400 mg by mouth daily.     metoprolol succinate (TOPROL-XL) 100 MG 24 hr tablet Take 1 tablet (100 mg total) by mouth  daily. With or immediately following a meal **NEEDS TO BE SEEN BEFORE NEXT REFILL** 30 tablet 0   ondansetron (ZOFRAN-ODT) 4 MG disintegrating tablet Take 1 tablet (4 mg total) by mouth every 8 (eight) hours as needed for nausea or vomiting. 20 tablet 1   potassium chloride (KLOR-CON M) 10 MEQ tablet Take 1 tablet (10 mEq total) by mouth 2 (two) times daily. 30 tablet 0   promethazine-dextromethorphan (PROMETHAZINE-DM) 6.25-15 MG/5ML syrup Take 5 mLs by mouth 4 (four) times daily as needed for cough. 118 mL 0   tamsulosin (FLOMAX) 0.4 MG CAPS capsule Take 1 capsule (0.4 mg total) by mouth daily. 30 capsule 0   tirzepatide (MOUNJARO) 10 MG/0.5ML Pen Inject 10 mg into the skin once a week. 2 mL 0   tirzepatide (MOUNJARO) 7.5 MG/0.5ML Pen Inject 7.5 mg into the skin once a week. 2 mL 0   tiZANidine (ZANAFLEX) 4 MG tablet Take 1 tablet (4 mg total) by mouth every 6 (six) hours as needed for muscle spasms. 30 tablet 1   traZODone (DESYREL) 50 MG tablet Take 0.5-2 tablets (25-100 mg total) by mouth at bedtime as needed for sleep. 180 tablet 3   No current facility-administered medications for this visit.    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   COVID-19 12/03/2019   body aches, joint pain, fever 104 x 4 days, then symptoms resolved   History of kidney stones    Hyperlipidemia    Hypertension    PONV (postoperative nausea and vomiting)    n/v after appendectomy 5-6 yrs ago   Sleep apnea    uses cpap   Thyroid disease    thyroidectomy due to cancer   Past Surgical History:  Procedure Laterality Date   ABDOMINAL HYSTERECTOMY  09/12/2016   partial   APPENDECTOMY  2011   BACK SURGERY     CESAREAN SECTION     x 3   CHOLECYSTECTOMY  2000   laproscopic   CYSTOSCOPY W/ URETERAL STENT PLACEMENT N/A 12/03/2019   Procedure: CYSTOSCOPY WITH STENT PLACEMENT, RETROGRADE;   Surgeon: Malen Gauze, MD;  Location: [facility];  Service: Urology;  Laterality: N/A;   CYSTOSCOPY WITH RETROGRADE PYELOGRAM, URETEROSCOPY AND STENT PLACEMENT Left 12/24/2019   Procedure: CYSTOSCOPY WITH RETROGRADE PYELOGRAM, URETEROSCOPY, STENT REMOVAL AND STONE EXTRACTION;  Surgeon: Malen Gauze, MD;  [facility];  Service: Urology;  Laterality: Left;   CYSTOSCOPY WITH RETROGRADE PYELOGRAM, URETEROSCOPY AND STENT PLACEMENT Bilateral 05/04/2021   Procedure: CYSTOSCOPY WITH RETROGRADE PYELOGRAM, URETEROSCOPY AND STENT PLACEMENT;  Surgeon: Malen Gauze, MD;  Location: [facility];  Service: Urology;  Laterality: Bilateral;   discectomy     HOLMIUM LASER APPLICATION Bilateral 05/04/2021   Procedure: HOLMIUM LASER APPLICATION;  Surgeon: Malen Gauze, MD;  Location: [facility];  Service: Urology;  Laterality: Bilateral;   TOTAL THYROIDECTOMY  2000   sx and radiation done   Family History  Problem Relation Age of Onset   Hypertension Mother    Dementia Mother    Macular degeneration Mother    Stroke Mother    COPD Father        lung cancer   Cancer Brother        colon cancer   Cancer Brother        brain tumor   Social History   Socioeconomic History   Marital status: Widowed    Spouse name: Not on file   Number of children: Not on file   Years of education: Not on file   Highest education level: Associate degree: academic program  Occupational History   Not on file  Tobacco Use   Smoking status: Never   Smokeless tobacco: Never  Vaping Use   Vaping status: Never Used  Substance and Sexual Activity   Alcohol use: No   Drug use: No   Sexual activity: Not Currently    Birth control/protection: Surgical  Other Topics Concern   Not on file  Social History Narrative   Not on file   Social Determinants of Health   Financial Resource Strain: Low Risk  (11/29/2022)   Overall Financial Resource Strain (CARDIA)    Difficulty of Paying  Living Expenses: Not hard at all  Food Insecurity: No Food Insecurity (11/29/2022)   Hunger Vital Sign    Worried About Running Out of Food in the Last Year: Never true    Ran Out of Food in the Last Year: Never true  Transportation Needs: No Transportation Needs (11/29/2022)   PRAPARE - Administrator, Civil Service (Medical): No    Lack of Transportation (Non-Medical): No  Physical Activity: Insufficiently Active (11/29/2022)   Exercise Vital Sign    Days of Exercise per Week: 4 days    Minutes of Exercise per Session: 30 min  Stress: No Stress Concern Present (11/29/2022)   [employer] of Occupational Health - Occupational Stress Questionnaire    Feeling of Stress : Not at all  Social Connections: Moderately Integrated (11/29/2022)   Social Connection and Isolation Panel [NHANES]    Frequency of Communication with Friends and Family: More than three times a week    Frequency of Social Gatherings with Friends and Family: Three times a week    Attends Religious Services: More than 4 times per year    Active Member of Clubs or Organizations: Yes    Attends Banker Meetings: More than 4 times per year    Marital Status: Widowed  Catering manager Violence: Not on file    SUBJECTIVE  Review of Systems Constitutional: Patient denies any unintentional weight loss or change in strength lntegumentary: Patient denies any rashes or pruritus Cardiovascular: Patient denies chest pain or syncope Respiratory: Patient denies shortness of breath Gastrointestinal: As per HPI Musculoskeletal: Patient denies muscle cramps or weakness Neurologic: Patient denies convulsions or seizures Allergic/Immunologic: Patient denies recent allergic reaction(s) Hematologic/Lymphatic: Patient denies bleeding tendencies Endocrine: Patient denies heat/cold intolerance  GU: As per HPI.  OBJECTIVE Vitals:   06/21/23 1310  BP: 128/69  Pulse: 84  Temp: 99.2 F (37.3 C)   There is  no height or weight on file to calculate BMI.  Physical Examination Constitutional: No obvious distress; patient is non-toxic appearing Cardiovascular: No visible lower extremity edema.  Respiratory: The patient does not have audible wheezing/stridor;  respirations do not appear labored  Gastrointestinal: Abdomen non-distended Musculoskeletal: Normal ROM of UEs  Skin: No obvious rashes/open sores  Neurologic: CN 2-12 grossly intact Psychiatric: Answered questions appropriately with normal affect  Hematologic/Lymphatic/Immunologic: No obvious bruises or sites of spontaneous bleeding  UA: negative   ASSESSMENT Kidney stones - Plan: Urinalysis, Routine w reflex microscopic, CT RENAL STONE STUDY  Left flank pain - Plan: CT RENAL STONE STUDY  Lower urinary tract symptoms (LUTS) - Plan: Urinalysis, Routine w reflex microscopic, CT RENAL STONE STUDY  Stress incontinence of urine - Plan: Urinalysis, Routine w reflex microscopic  Left lower quadrant abdominal pain - Plan: CT RENAL STONE STUDY  Low grade fever - Plan: CT RENAL STONE STUDY  Patient was advised that Dr. Ronne Binning reviewed her imaging studies today; no ureteral stones appreciated. Compared recent KUB images with CT from December 2023 - has stable bilateral pelvic phleboliths. Low suspicion for acute stone episode as the etiology for her symptoms based on recent KUB images and today's normal UA, however we did discuss possible radiolucent stone and therefore advised her to proceed with CT stone study (ordered to be done ASAP based on symptoms including low grade fever). She was advised to continue Flomax 0.4 mg daily.   We considered possible non-GU etiologies such as diverticulitis, colitis, etc. We discussed evidence of constipation on KUB yesterday; advised stool softener and/or laxative use PRN. She was encouraged to follow up with PCP to further evaluate for GI or other non-GU etiologies in case CT stone comes back negative.  OK   to continue Doxycyline as prescribed by dentist.   She was advised to go to the ER if She develops fever >100.5 F, uncontrollable pain, or other significantly concerning symptoms.  Will plan to follow up in 2 weeks for recheck or sooner if needed.  Pt verbalized understanding and agreement. All questions were answered.   PLAN Advised the following: CT stone. Continue Flomax 0.4 mg daily. Stool softener and/or laxative PRN. Return in about 2 weeks (around 07/05/2023) for UA, PVR, & f/u with Evette Georges NP.  Orders Placed This Encounter  Procedures   CT RENAL STONE STUDY    Standing Status:   Future    Standing Expiration Date:   06/20/2024    Order Specific Question:   Is patient pregnant?    Answer:   No    Order Specific Question:   Preferred imaging location?    Answer:   [facility]   Urinalysis, Routine w reflex microscopic    It has been explained that the patient is to follow regularly with their PCP in addition to all other providers involved in their care and to follow instructions provided by these respective offices. Patient advised to contact urology clinic if any urologic-pertaining questions, concerns, new symptoms or problems arise in the interim period.  There are no Patient Instructions on file for this visit.  Electronically signed by:  Donnita Falls, MSN, FNP-C, CUNP 06/21/2023 1:44 PM

## 2023-06-20 ENCOUNTER — Ambulatory Visit (HOSPITAL_COMMUNITY)
Admission: RE | Admit: 2023-06-20 | Discharge: 2023-06-20 | Disposition: A | Discharge status: Home / Self Care | Payer: Commercial Managed Care - PPO | Source: Ambulatory Visit | Attending: Urology | Admitting: Urology

## 2023-06-20 DIAGNOSIS — R1032 Left lower quadrant pain: Secondary | ICD-10-CM | POA: Diagnosis not present

## 2023-06-20 DIAGNOSIS — R399 Unspecified symptoms and signs involving the genitourinary system: Secondary | ICD-10-CM

## 2023-06-20 DIAGNOSIS — R109 Unspecified abdominal pain: Secondary | ICD-10-CM | POA: Diagnosis not present

## 2023-06-20 DIAGNOSIS — K5900 Constipation, unspecified: Secondary | ICD-10-CM | POA: Diagnosis not present

## 2023-06-20 DIAGNOSIS — R110 Nausea: Secondary | ICD-10-CM | POA: Diagnosis not present

## 2023-06-20 DIAGNOSIS — N200 Calculus of kidney: Secondary | ICD-10-CM

## 2023-06-21 ENCOUNTER — Encounter: Payer: Self-pay | Admitting: Urology

## 2023-06-21 ENCOUNTER — Ambulatory Visit: Payer: Commercial Managed Care - PPO | Admitting: Urology

## 2023-06-21 ENCOUNTER — Ambulatory Visit (HOSPITAL_COMMUNITY)
Admission: RE | Admit: 2023-06-21 | Discharge: 2023-06-21 | Disposition: A | Discharge status: Home / Self Care | Payer: Commercial Managed Care - PPO | Source: Ambulatory Visit | Attending: Urology | Admitting: Urology

## 2023-06-21 VITALS — BP 128/69 | HR 84 | Temp 99.2°F

## 2023-06-21 DIAGNOSIS — N200 Calculus of kidney: Secondary | ICD-10-CM | POA: Diagnosis not present

## 2023-06-21 DIAGNOSIS — R399 Unspecified symptoms and signs involving the genitourinary system: Secondary | ICD-10-CM

## 2023-06-21 DIAGNOSIS — N393 Stress incontinence (female) (male): Secondary | ICD-10-CM | POA: Diagnosis not present

## 2023-06-21 DIAGNOSIS — R350 Frequency of micturition: Secondary | ICD-10-CM | POA: Diagnosis not present

## 2023-06-21 DIAGNOSIS — R1032 Left lower quadrant pain: Secondary | ICD-10-CM | POA: Diagnosis not present

## 2023-06-21 DIAGNOSIS — R3911 Hesitancy of micturition: Secondary | ICD-10-CM

## 2023-06-21 DIAGNOSIS — R109 Unspecified abdominal pain: Secondary | ICD-10-CM | POA: Diagnosis not present

## 2023-06-21 DIAGNOSIS — R509 Fever, unspecified: Secondary | ICD-10-CM | POA: Diagnosis not present

## 2023-06-21 DIAGNOSIS — K760 Fatty (change of) liver, not elsewhere classified: Secondary | ICD-10-CM | POA: Diagnosis not present

## 2023-06-21 DIAGNOSIS — Z87442 Personal history of urinary calculi: Secondary | ICD-10-CM | POA: Diagnosis not present

## 2023-06-21 DIAGNOSIS — R3915 Urgency of urination: Secondary | ICD-10-CM | POA: Diagnosis not present

## 2023-06-21 LAB — URINALYSIS, ROUTINE W REFLEX MICROSCOPIC
Bilirubin, UA: NEGATIVE
Glucose, UA: NEGATIVE
Ketones, UA: NEGATIVE
Leukocytes,UA: NEGATIVE
Nitrite, UA: NEGATIVE
Protein,UA: NEGATIVE
RBC, UA: NEGATIVE
Specific Gravity, UA: 1.015 (ref 1.005–1.030)
Urobilinogen, Ur: 0.2 mg/dL (ref 0.2–1.0)
pH, UA: 7 (ref 5.0–7.5)

## 2023-06-24 ENCOUNTER — Other Ambulatory Visit: Payer: Self-pay | Admitting: Family Medicine

## 2023-06-24 DIAGNOSIS — E890 Postprocedural hypothyroidism: Secondary | ICD-10-CM

## 2023-06-24 DIAGNOSIS — E1169 Type 2 diabetes mellitus with other specified complication: Secondary | ICD-10-CM

## 2023-06-24 DIAGNOSIS — I10 Essential (primary) hypertension: Secondary | ICD-10-CM

## 2023-06-25 ENCOUNTER — Other Ambulatory Visit (HOSPITAL_COMMUNITY): Payer: Self-pay

## 2023-06-25 MED ORDER — MOUNJARO 10 MG/0.5ML ~~LOC~~ SOAJ
10.0000 mg | SUBCUTANEOUS | 0 refills | Status: DC
Start: 2023-06-25 — End: 2023-07-23
  Filled 2023-06-25: qty 2, 28d supply, fill #0

## 2023-06-25 MED ORDER — METOPROLOL SUCCINATE ER 100 MG PO TB24
100.0000 mg | ORAL_TABLET | Freq: Every day | ORAL | 0 refills | Status: DC
Start: 2023-06-25 — End: 2023-08-14
  Filled 2023-06-25: qty 30, 30d supply, fill #0

## 2023-06-25 MED ORDER — AMLODIPINE BESYLATE 10 MG PO TABS
10.0000 mg | ORAL_TABLET | Freq: Every day | ORAL | 0 refills | Status: DC
Start: 2023-06-25 — End: 2023-08-14
  Filled 2023-06-25: qty 30, 30d supply, fill #0

## 2023-06-25 MED ORDER — LEVOTHYROXINE SODIUM 175 MCG PO TABS
175.0000 ug | ORAL_TABLET | Freq: Every day | ORAL | 0 refills | Status: DC
Start: 2023-06-25 — End: 2023-08-14
  Filled 2023-06-25: qty 30, 30d supply, fill #0

## 2023-06-27 ENCOUNTER — Other Ambulatory Visit (HOSPITAL_COMMUNITY): Payer: Self-pay

## 2023-06-30 ENCOUNTER — Other Ambulatory Visit: Payer: Self-pay | Admitting: Urology

## 2023-06-30 DIAGNOSIS — R399 Unspecified symptoms and signs involving the genitourinary system: Secondary | ICD-10-CM

## 2023-06-30 DIAGNOSIS — R1032 Left lower quadrant pain: Secondary | ICD-10-CM

## 2023-06-30 DIAGNOSIS — N200 Calculus of kidney: Secondary | ICD-10-CM

## 2023-07-01 ENCOUNTER — Telehealth: Payer: Self-pay

## 2023-07-01 NOTE — Telephone Encounter (Signed)
-----   Message from Donnita Falls sent at 07/01/2023  1:42 PM EDT ----- Please call patient to:  1) Let her know that her CT on 06/21/2023 showed no acute findings - nothing to explain her left flank pain or other recent symptoms. She has small non-obstructive stones in both kidneys with no ureteral stones or hydronephrosis. As discussed, there may be a GI etiology for her symptoms.  2) Ask if she still wants to keep her appointment on 07/08/23 given that there is no acute urologic explanation for her pain? 3) Notify her that if she is still symptomatic she should contact her PCP or GI provider promptly or go to ER if symptoms are severe / worsen.

## 2023-07-01 NOTE — Telephone Encounter (Signed)
Tried calling patient with no answer, unable to leave vm due to mailbox full.

## 2023-07-08 ENCOUNTER — Ambulatory Visit: Payer: Commercial Managed Care - PPO | Admitting: Urology

## 2023-07-20 ENCOUNTER — Other Ambulatory Visit: Payer: Self-pay | Admitting: Family Medicine

## 2023-07-20 DIAGNOSIS — E890 Postprocedural hypothyroidism: Secondary | ICD-10-CM

## 2023-07-20 DIAGNOSIS — E1169 Type 2 diabetes mellitus with other specified complication: Secondary | ICD-10-CM

## 2023-07-20 DIAGNOSIS — F5101 Primary insomnia: Secondary | ICD-10-CM

## 2023-07-20 DIAGNOSIS — I10 Essential (primary) hypertension: Secondary | ICD-10-CM

## 2023-07-22 ENCOUNTER — Other Ambulatory Visit: Payer: Self-pay | Admitting: Family Medicine

## 2023-07-22 ENCOUNTER — Other Ambulatory Visit: Payer: Self-pay

## 2023-07-22 DIAGNOSIS — E890 Postprocedural hypothyroidism: Secondary | ICD-10-CM

## 2023-07-22 DIAGNOSIS — I10 Essential (primary) hypertension: Secondary | ICD-10-CM

## 2023-07-22 DIAGNOSIS — E1169 Type 2 diabetes mellitus with other specified complication: Secondary | ICD-10-CM

## 2023-07-23 ENCOUNTER — Other Ambulatory Visit: Payer: Self-pay | Admitting: Family Medicine

## 2023-07-23 ENCOUNTER — Other Ambulatory Visit (HOSPITAL_COMMUNITY): Payer: Self-pay

## 2023-07-23 DIAGNOSIS — E1169 Type 2 diabetes mellitus with other specified complication: Secondary | ICD-10-CM

## 2023-07-23 MED ORDER — MOUNJARO 10 MG/0.5ML ~~LOC~~ SOAJ
10.0000 mg | SUBCUTANEOUS | 0 refills | Status: DC
  Filled 2023-07-23: qty 2, 28d supply, fill #0

## 2023-07-24 ENCOUNTER — Other Ambulatory Visit (HOSPITAL_COMMUNITY): Payer: Self-pay

## 2023-07-31 ENCOUNTER — Other Ambulatory Visit (HOSPITAL_COMMUNITY): Payer: Self-pay

## 2023-08-14 ENCOUNTER — Other Ambulatory Visit (HOSPITAL_COMMUNITY): Payer: Self-pay

## 2023-08-14 ENCOUNTER — Other Ambulatory Visit: Payer: Self-pay | Admitting: Family Medicine

## 2023-08-14 ENCOUNTER — Other Ambulatory Visit: Payer: Self-pay | Admitting: Nurse Practitioner

## 2023-08-14 DIAGNOSIS — I10 Essential (primary) hypertension: Secondary | ICD-10-CM

## 2023-08-14 DIAGNOSIS — E890 Postprocedural hypothyroidism: Secondary | ICD-10-CM

## 2023-08-14 DIAGNOSIS — F5101 Primary insomnia: Secondary | ICD-10-CM

## 2023-08-14 DIAGNOSIS — E1169 Type 2 diabetes mellitus with other specified complication: Secondary | ICD-10-CM

## 2023-08-14 DIAGNOSIS — J302 Other seasonal allergic rhinitis: Secondary | ICD-10-CM

## 2023-08-14 MED ORDER — TRAZODONE HCL 50 MG PO TABS
25.0000 mg | ORAL_TABLET | Freq: Every evening | ORAL | 1 refills | Status: DC | PRN
  Filled 2023-08-14: qty 180, 90d supply, fill #0
  Filled 2023-10-07 – 2023-12-01 (×2): qty 180, 90d supply, fill #1

## 2023-08-14 MED ORDER — AMLODIPINE BESYLATE 10 MG PO TABS
10.0000 mg | ORAL_TABLET | Freq: Every day | ORAL | 1 refills | Status: DC
  Filled 2023-08-14: qty 30, 30d supply, fill #0
  Filled 2023-09-13: qty 30, 30d supply, fill #1

## 2023-08-14 MED ORDER — METOPROLOL SUCCINATE ER 100 MG PO TB24
100.0000 mg | ORAL_TABLET | Freq: Every day | ORAL | 1 refills | Status: DC
  Filled 2023-08-14: qty 30, 30d supply, fill #0
  Filled 2023-09-18: qty 30, 30d supply, fill #1

## 2023-08-14 MED ORDER — FLUTICASONE PROPIONATE 50 MCG/ACT NA SUSP
2.0000 | Freq: Every day | NASAL | 1 refills | Status: DC
  Filled 2023-08-14: qty 48, 90d supply, fill #0
  Filled 2023-12-01: qty 48, 90d supply, fill #1

## 2023-08-14 MED ORDER — LEVOTHYROXINE SODIUM 175 MCG PO TABS
175.0000 ug | ORAL_TABLET | Freq: Every day | ORAL | 1 refills | Status: DC
  Filled 2023-08-14: qty 30, 30d supply, fill #0
  Filled 2023-09-18: qty 30, 30d supply, fill #1

## 2023-08-14 MED ORDER — MOUNJARO 10 MG/0.5ML ~~LOC~~ SOAJ
10.0000 mg | SUBCUTANEOUS | 0 refills | Status: DC
  Filled 2023-08-14: qty 2, 28d supply, fill #0

## 2023-09-13 ENCOUNTER — Other Ambulatory Visit (HOSPITAL_BASED_OUTPATIENT_CLINIC_OR_DEPARTMENT_OTHER): Payer: Self-pay

## 2023-09-13 ENCOUNTER — Other Ambulatory Visit: Payer: Self-pay

## 2023-09-13 ENCOUNTER — Encounter: Payer: Self-pay | Admitting: Family Medicine

## 2023-09-13 ENCOUNTER — Other Ambulatory Visit: Payer: Self-pay | Admitting: Family Medicine

## 2023-09-13 ENCOUNTER — Other Ambulatory Visit (HOSPITAL_COMMUNITY): Payer: Self-pay

## 2023-09-13 DIAGNOSIS — E1169 Type 2 diabetes mellitus with other specified complication: Secondary | ICD-10-CM

## 2023-09-13 DIAGNOSIS — E119 Type 2 diabetes mellitus without complications: Secondary | ICD-10-CM

## 2023-09-13 MED ORDER — TIRZEPATIDE 12.5 MG/0.5ML ~~LOC~~ SOAJ
12.5000 mg | SUBCUTANEOUS | 1 refills | Status: DC
  Filled 2023-09-13: qty 2, 28d supply, fill #0
  Filled 2023-10-07: qty 2, 28d supply, fill #1
  Filled 2023-10-25 – 2023-11-08 (×2): qty 2, 28d supply, fill #2
  Filled 2023-12-01: qty 2, 28d supply, fill #3
  Filled 2024-01-04: qty 2, 28d supply, fill #4
  Filled 2024-02-01: qty 2, 28d supply, fill #5

## 2023-09-13 NOTE — Telephone Encounter (Signed)
 Last office visit 11/30/22 Upcoming appointment 09/17/23 Last refill 08/14/23, 2 ml, no refills

## 2023-09-17 ENCOUNTER — Ambulatory Visit: Payer: Commercial Managed Care - PPO | Admitting: Family Medicine

## 2023-09-17 ENCOUNTER — Encounter: Payer: Self-pay | Admitting: Family Medicine

## 2023-09-17 VITALS — BP 111/66 | HR 67 | Temp 98.4°F | Ht 63.0 in | Wt 195.8 lb

## 2023-09-17 DIAGNOSIS — Z7985 Long-term (current) use of injectable non-insulin antidiabetic drugs: Secondary | ICD-10-CM | POA: Diagnosis not present

## 2023-09-17 DIAGNOSIS — E785 Hyperlipidemia, unspecified: Secondary | ICD-10-CM | POA: Diagnosis not present

## 2023-09-17 DIAGNOSIS — I152 Hypertension secondary to endocrine disorders: Secondary | ICD-10-CM | POA: Diagnosis not present

## 2023-09-17 DIAGNOSIS — Z114 Encounter for screening for human immunodeficiency virus [HIV]: Secondary | ICD-10-CM

## 2023-09-17 DIAGNOSIS — E1159 Type 2 diabetes mellitus with other circulatory complications: Secondary | ICD-10-CM | POA: Diagnosis not present

## 2023-09-17 DIAGNOSIS — L659 Nonscarring hair loss, unspecified: Secondary | ICD-10-CM

## 2023-09-17 DIAGNOSIS — I700 Atherosclerosis of aorta: Secondary | ICD-10-CM

## 2023-09-17 DIAGNOSIS — Z23 Encounter for immunization: Secondary | ICD-10-CM | POA: Diagnosis not present

## 2023-09-17 DIAGNOSIS — E890 Postprocedural hypothyroidism: Secondary | ICD-10-CM | POA: Diagnosis not present

## 2023-09-17 DIAGNOSIS — E1169 Type 2 diabetes mellitus with other specified complication: Secondary | ICD-10-CM

## 2023-09-17 DIAGNOSIS — Z1159 Encounter for screening for other viral diseases: Secondary | ICD-10-CM

## 2023-09-17 DIAGNOSIS — E119 Type 2 diabetes mellitus without complications: Secondary | ICD-10-CM | POA: Diagnosis not present

## 2023-09-17 LAB — BAYER DCA HB A1C WAIVED: HB A1C (BAYER DCA - WAIVED): 5.5 % (ref 4.8–5.6)

## 2023-09-17 NOTE — Patient Instructions (Signed)
 You are OVERDUE for complete physical with fasting labs. Please make sure you are scheduled for this sometime in July when you check out today. Schedule your diabetic eye exam. This can now be done here for convenience if needed. You are OVERDUE for mammogram, colon cancer screening

## 2023-09-17 NOTE — Progress Notes (Signed)
 Subjective: CC:DM, hypothyroidism PCP: [name] HERO, DO YEP:Ann Peterson is a 58 y.o. female presenting to clinic today for:  1. Type 2 Diabetes with hypertension, hyperlipidemia:  Patient reports that she has been compliant with her amlodipine , indapamide , metoprolol , Mounjaro .  She has not had any hypoglycemic episodes.  She continues to have sustainable weight loss.  Diabetes Health Maintenance Due  Topic Date Due   HEMOGLOBIN A1C  06/02/2023   FOOT EXAM  08/16/2023   OPHTHALMOLOGY EXAM  01/07/2024 (Originally 11/15/1975)    Last A1c:  Lab Results  Component Value Date   HGBA1C 6.0 (H) 11/30/2022    ROS: denies dizziness, LOC, polyuria, polydipsia, unintended weight loss/gain, foot ulcerations, numbness or tingling in extremities, shortness of breath or chest pain.  2. Hair thinning She reports hair thinning that has been refractory to topical minoxidil, various OTC hair growth products, oral hair skin and nail vitamins including biotin etc.  Symptoms have been exacerbated with weight loss but were present even before this.  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   COVID-19 12/03/2019   body aches, joint pain, fever 104 x 4 days, then symptoms resolved   History of kidney stones    Hyperlipidemia    Hypertension    PONV (postoperative nausea and vomiting)    n/v after appendectomy 5-6 yrs ago   Sleep apnea    uses cpap   Thyroid  disease    thyroidectomy due to cancer    Current Outpatient Medications:    albuterol  (VENTOLIN  HFA) 108 (90 Base) MCG/ACT inhaler, Inhale 2 puffs into the lungs every 6 (six) hours as needed for wheezing or shortness of breath., Disp: 8 g, Rfl: 2   amLODipine  (NORVASC ) 10 MG tablet, Take 1 tablet (10 mg total) by mouth daily., Disp: 30 tablet, Rfl: 1    BIOTIN PO, Take 1 capsule by mouth every evening., Disp: , Rfl:    blood glucose meter kit and supplies, Use up to four times daily as directed., Disp: 1 each, Rfl: 0   celecoxib  (CELEBREX ) 50 MG capsule, Take 1 capsule (50 mg total) by mouth 2 (two) times daily as needed for pain., Disp: 180 capsule, Rfl: PRN   Cholecalciferol (VITAMIN D ) 50 MCG (2000 UT) CAPS, Take 1 capsule by mouth every evening., Disp: , Rfl:    co-enzyme Q-10 30 MG capsule, Take 30 mg by mouth daily., Disp: , Rfl:    Continuous Blood Gluc Sensor (FREESTYLE LIBRE 3 SENSOR) MISC, Place 1 sensor on the skin every 14 days. Use to check glucose continuously E11.9, Disp: 6 each, Rfl: 4   cyclobenzaprine  (FLEXERIL ) 10 MG tablet, Take 1 tablet (10 mg total) by mouth 3 (three) times daily as needed for muscle spasms, Disp: 60 tablet, Rfl: 0   fluticasone  (FLONASE ) 50 MCG/ACT nasal spray, Place 2 sprays into both nostrils daily., Disp: 48 g, Rfl: 1   gabapentin  (NEURONTIN ) 300 MG capsule, Take 1 capsule (300 mg total) by mouth 3 (three) times daily., Disp: 90 capsule, Rfl: 0   glucose blood test strip, Use as instructed to check blood sugar daily., Disp: 100 each, Rfl: PRN   indapamide  (LOZOL ) 2.5 MG tablet, TAKE 1 TABLET BY MOUTH EVERY DAY, Disp: 90 tablet, Rfl: 3   Lancets (FREESTYLE) lancets, Test blood glucose  once daily as directed., Disp: 100 each, Rfl: PRN   levothyroxine  (SYNTHROID ) 175 MCG tablet, Take 1 tablet (175 mcg total) by mouth daily., Disp: 30 tablet, Rfl: 1   magnesium oxide (MAG-OX) 400 MG tablet, Take 400 mg by mouth daily., Disp: , Rfl:    metoprolol  succinate (TOPROL -XL) 100 MG 24 hr tablet, Take 1 tablet (100 mg total) by mouth daily. With or immediately following a meal, Disp: 30 tablet, Rfl: 1   ondansetron  (ZOFRAN -ODT) 4 MG disintegrating tablet, Take 1 tablet (4 mg total) by mouth every 8 (eight) hours as needed for nausea or vomiting., Disp: 20 tablet, Rfl: 1   potassium chloride  (KLOR-CON  M) 10 MEQ tablet,  Take 1 tablet (10 mEq total) by mouth 2 (two) times daily., Disp: 30 tablet, Rfl: 0   promethazine -dextromethorphan (PROMETHAZINE -DM) 6.25-15 MG/5ML syrup, Take 5 mLs by mouth 4 (four) times daily as needed for cough., Disp: 118 mL, Rfl: 0   tamsulosin  (FLOMAX ) 0.4 MG CAPS capsule, TAKE 1 CAPSULE BY MOUTH EVERY DAY, Disp: 90 capsule, Rfl: 1   tirzepatide  (MOUNJARO ) 12.5 MG/0.5ML Pen, Inject 12.5 mg into the skin once a week., Disp: 6 mL, Rfl: 1   tiZANidine  (ZANAFLEX ) 4 MG tablet, Take 1 tablet (4 mg total) by mouth every 6 (six) hours as needed for muscle spasms., Disp: 30 tablet, Rfl: 1   traZODone  (DESYREL ) 50 MG tablet, Take 0.5-2 tablets (25-100 mg total) by mouth at bedtime as needed for sleep., Disp: 180 tablet, Rfl: 1 Social History   Socioeconomic History   Marital status: Widowed    Spouse name: Not on file   Number of children: Not on file   Years of education: Not on file   Highest education level: Associate degree: academic program  Occupational History   Not on file  Tobacco Use   Smoking status: Never   Smokeless tobacco: Never  Vaping Use   Vaping status: Never Used  Substance and Sexual Activity   Alcohol use: No   Drug use: No   Sexual activity: Not Currently    Birth control/protection: Surgical  Other Topics Concern   Not on file  Social History Narrative   Not on file   Social Drivers of Health   Financial Resource Strain: Medium Risk (09/16/2023)   Overall Financial Resource Strain (CARDIA)    Difficulty of Paying Living Expenses: Somewhat hard  Food Insecurity: No Food Insecurity (09/16/2023)   Hunger Vital Sign    Worried About Running Out of Food in the Last Year: Never true    Ran Out of Food in the Last Year: Never true  Transportation Needs: No Transportation Needs (09/16/2023)   PRAPARE - Administrator, Civil Service (Medical): No    Lack of Transportation (Non-Medical): No  Physical Activity: Insufficiently Active (09/16/2023)   Exercise  Vital Sign    Days of Exercise per Week: 2 days    Minutes of Exercise per Session: 10 min  Stress: No Stress Concern Present (09/16/2023)   [employer] of Occupational Health - Occupational Stress Questionnaire    Feeling of Stress : Only a little  Social Connections: Moderately Integrated (09/16/2023)   Social Connection and Isolation Panel [NHANES]    Frequency of Communication with Friends and Family: More than three times a week    Frequency of Social Gatherings with Friends and Family: Once a week    Attends Religious Services: More than 4 times per year    Active Member of [employer] or Organizations:  Yes    Attends Club or Organization Meetings: More than 4 times per year    Marital Status: Widowed  Intimate Partner Violence: Not At Risk (09/17/2023)   Humiliation, Afraid, Rape, and Kick questionnaire    Fear of Current or Ex-Partner: No    Emotionally Abused: No    Physically Abused: No    Sexually Abused: No   Family History  Problem Relation Age of Onset   Hypertension Mother    Dementia Mother    Macular degeneration Mother    Stroke Mother    COPD Father        lung cancer   Cancer Brother        colon cancer   Cancer Brother        brain tumor    Objective: Office vital signs reviewed. BP 111/66   Pulse 67   Temp 98.4 F (36.9 C) (Temporal)   Ht 5' 3 (1.6 m)   Wt 195 lb 12.8 oz (88.8 kg)   LMP 09/07/2016 (Exact Date)   SpO2 96%   BMI 34.68 kg/m   Physical Examination:  General: Awake, alert, well nourished, No acute distress HEENT: sclera white, MMM, diffuse hair thinning appreciated throughout crown. Cardio: regular rate and rhythm, S1S2 heard, no murmurs appreciated Pulm: clear to auscultation bilaterally, no wheezes, rhonchi or rales; normal work of breathing on room air GI: soft, non-tender, non-distended, bowel sounds present x4, no hepatomegaly, no splenomegaly, no masses    Diabetic Foot Exam - Simple   Simple Foot Form Diabetic Foot exam was  performed with the following findings: Yes 09/17/2023  6:37 PM  Visual Inspection No deformities, no ulcerations, no other skin breakdown bilaterally: Yes Sensation Testing Intact to touch and monofilament testing bilaterally: Yes Pulse Check Posterior Tibialis and Dorsalis pulse intact bilaterally: Yes Comments     The 10-year ASCVD risk score (Arnett DK, et al., 2019) is: 6.9%   Values used to calculate the score:     Age: 39 years     Sex: Female     Is Non-Hispanic African American: No     Diabetic: Yes     Tobacco smoker: No     Systolic Blood Pressure: 111 mmHg     Is BP treated: Yes     HDL Cholesterol: 38 mg/dL     Total Cholesterol: 235 mg/dL  Assessment/ Plan: 58 y.o. female   Diabetes mellitus treated with injections of non-insulin  medication (HCC) - Plan: Microalbumin / creatinine urine ratio, Bayer DCA Hb A1c Waived, CMP14+EGFR  Hyperlipidemia associated with type 2 diabetes mellitus (HCC) - Plan: CMP14+EGFR, Lipid Panel  Hypertension associated with diabetes (HCC) - Plan: CMP14+EGFR  Aortic atherosclerosis (HCC) - Plan: CMP14+EGFR  Postoperative hypothyroidism - Plan: TSH + free T4  Screening for HIV (human immunodeficiency virus) - Plan: HIV Antibody (routine testing w rflx)  Need for hepatitis C screening test - Plan: Hepatitis C Antibody  Immunization due - Plan: Zoster Recombinant (Shingrix  )  Hair thinning - Plan: Ambulatory referral to Dermatology  Sugar remains under excellent control with A1c of 5.5.  She may continue Mounjaro  at current dosing.  She has diabetic eye exam scheduled.  We perform diabetic foot exam today.  I have added fasting lipid panel as she was amenable to this today and we will get that hopefully added to today's labs that were already collected at time of discussion.  She is not currently treated with a statin.  Though it is of course recommended because  she is a type II diabetic despite control of diabetes.  ASCVD risk or 6.9%  today.  Given known aortic atherosclerosis, we will need to strongly consider starting something like Crestor  or atorvastatin.  This has been held up into this point due to elevation liver function test but at last checkup in March these were downtrending.  Will check thyroid  level today.    Referral for further evaluation and management of thinning hair placed.  Discussed with patient this may be several month wait before she can see this provider but she is willing to wait given refractory symptoms to multiple treatment products so far  She was given shingles vaccination today  Would like to see her back in 6 months for full annual physical.  She is overdue for colon cancer screening etc. and we will need to facilitate this for her.  HIV and hepatitis C screening collected today.  Yearly mammogram to be scheduled at discharge  Ann CHRISTELLA Fielding, DO [facility] [phone]

## 2023-09-18 LAB — CMP14+EGFR
ALT: 36 [IU]/L — ABNORMAL HIGH (ref 0–32)
AST: 23 [IU]/L (ref 0–40)
Albumin: 4.7 g/dL (ref 3.8–4.9)
Alkaline Phosphatase: 84 [IU]/L (ref 44–121)
BUN/Creatinine Ratio: 14 (ref 9–23)
BUN: 12 mg/dL (ref 6–24)
Bilirubin Total: 0.6 mg/dL (ref 0.0–1.2)
CO2: 28 mmol/L (ref 20–29)
Calcium: 9.7 mg/dL (ref 8.7–10.2)
Chloride: 95 mmol/L — ABNORMAL LOW (ref 96–106)
Creatinine, Ser: 0.83 mg/dL (ref 0.57–1.00)
Globulin, Total: 2.7 g/dL (ref 1.5–4.5)
Glucose: 89 mg/dL (ref 70–99)
Potassium: 3.5 mmol/L (ref 3.5–5.2)
Sodium: 137 mmol/L (ref 134–144)
Total Protein: 7.4 g/dL (ref 6.0–8.5)
eGFR: 83 mL/min/{1.73_m2} (ref >59)

## 2023-09-18 LAB — MICROALBUMIN / CREATININE URINE RATIO
Creatinine, Urine: 105.4 mg/dL
Microalb/Creat Ratio: 11 mg/g{creat} (ref 0–29)
Microalbumin, Urine: 11.6 ug/mL

## 2023-09-18 LAB — HIV ANTIBODY (ROUTINE TESTING W REFLEX): HIV Screen 4th Generation wRfx: NONREACTIVE

## 2023-09-18 LAB — HEPATITIS C ANTIBODY: Hep C Virus Ab: NONREACTIVE

## 2023-09-18 LAB — TSH+FREE T4
Free T4: 1.39 ng/dL (ref 0.82–1.77)
TSH: 9.61 u[IU]/mL — ABNORMAL HIGH (ref 0.450–4.500)

## 2023-10-07 ENCOUNTER — Other Ambulatory Visit: Payer: Self-pay

## 2023-10-07 ENCOUNTER — Other Ambulatory Visit (HOSPITAL_COMMUNITY): Payer: Self-pay

## 2023-10-07 ENCOUNTER — Other Ambulatory Visit: Payer: Self-pay | Admitting: Family Medicine

## 2023-10-07 DIAGNOSIS — E890 Postprocedural hypothyroidism: Secondary | ICD-10-CM

## 2023-10-07 DIAGNOSIS — I10 Essential (primary) hypertension: Secondary | ICD-10-CM

## 2023-10-07 MED ORDER — METOPROLOL SUCCINATE ER 100 MG PO TB24
100.0000 mg | ORAL_TABLET | Freq: Every day | ORAL | 1 refills | Status: DC
  Filled 2023-10-07 – 2023-10-25 (×2): qty 90, 90d supply, fill #0
  Filled 2024-01-27: qty 90, 90d supply, fill #1

## 2023-10-07 MED ORDER — LEVOTHYROXINE SODIUM 175 MCG PO TABS
175.0000 ug | ORAL_TABLET | Freq: Every day | ORAL | 0 refills | Status: DC
  Filled 2023-10-07 – 2023-10-25 (×2): qty 90, 90d supply, fill #0

## 2023-10-07 MED ORDER — AMLODIPINE BESYLATE 10 MG PO TABS
10.0000 mg | ORAL_TABLET | Freq: Every day | ORAL | 1 refills | Status: DC
  Filled 2023-10-07: qty 90, 90d supply, fill #0
  Filled 2024-01-10: qty 90, 90d supply, fill #1

## 2023-10-12 ENCOUNTER — Other Ambulatory Visit: Payer: Self-pay | Admitting: Urology

## 2023-10-12 ENCOUNTER — Other Ambulatory Visit (HOSPITAL_COMMUNITY): Payer: Self-pay

## 2023-10-15 ENCOUNTER — Telehealth: Payer: Self-pay

## 2023-10-15 ENCOUNTER — Other Ambulatory Visit (HOSPITAL_COMMUNITY): Payer: Self-pay

## 2023-10-15 MED ORDER — INDAPAMIDE 2.5 MG PO TABS
2.5000 mg | ORAL_TABLET | Freq: Every day | ORAL | 3 refills | Status: DC
  Filled 2023-10-15: qty 90, 90d supply, fill #0
  Filled 2024-01-10: qty 90, 90d supply, fill #1
  Filled 2024-04-04: qty 90, 90d supply, fill #2
  Filled 2024-07-15: qty 90, 90d supply, fill #3

## 2023-10-15 NOTE — Telephone Encounter (Signed)
Pt called stating pharmacy stated NP cancelled Rx for indapamide Pt did not want that Rx cancelled and needs a refill

## 2023-10-25 ENCOUNTER — Other Ambulatory Visit: Payer: Self-pay

## 2023-10-25 ENCOUNTER — Other Ambulatory Visit (HOSPITAL_COMMUNITY): Payer: Self-pay

## 2023-11-11 ENCOUNTER — Ambulatory Visit (INDEPENDENT_AMBULATORY_CARE_PROVIDER_SITE_OTHER): Admitting: Family Medicine

## 2023-11-11 ENCOUNTER — Encounter: Payer: Self-pay | Admitting: Family Medicine

## 2023-11-11 VITALS — BP 136/74 | HR 78 | Temp 98.4°F | Ht 63.0 in | Wt 193.6 lb

## 2023-11-11 DIAGNOSIS — B349 Viral infection, unspecified: Secondary | ICD-10-CM | POA: Diagnosis not present

## 2023-11-11 DIAGNOSIS — R509 Fever, unspecified: Secondary | ICD-10-CM | POA: Diagnosis not present

## 2023-11-11 LAB — VERITOR FLU A/B WAIVED
Influenza A: NEGATIVE
Influenza B: NEGATIVE

## 2023-11-11 NOTE — Progress Notes (Signed)
 Acute Office Visit  Subjective:     Patient ID: Ann Peterson, female    DOB: 08/20/1966, 58 y.o.   MRN: [id number]  Chief Complaint  Patient presents with   Fever    Fever  This is a new problem. Episode onset: 2 days ago. Her temperature was unmeasured prior to arrival. Associated symptoms include congestion, coughing, diarrhea, headaches, muscle aches, a rash and a sore throat. Pertinent negatives include no abdominal pain, chest pain, ear pain, nausea, urinary pain or vomiting. She has tried acetaminophen, NSAIDs and fluids for the symptoms. The treatment provided mild relief.     Review of Systems  Constitutional:  Positive for fever.  HENT:  Positive for congestion and sore throat. Negative for ear pain.   Respiratory:  Positive for cough.   Cardiovascular:  Negative for chest pain.  Gastrointestinal:  Positive for diarrhea. Negative for abdominal pain, nausea and vomiting.  Genitourinary:  Negative for dysuria.  Skin:  Positive for rash.  Neurological:  Positive for headaches.        Objective:    BP 136/74   Pulse 78   Temp 98.4 F (36.9 C) (Temporal)   Ht 5\' 3"  (1.6 m)   Wt 193 lb 9.6 oz (87.8 kg)   LMP 09/07/2016 (Exact Date)   SpO2 96%   BMI 34.29 kg/m    Physical Exam Vitals and nursing note reviewed.  Constitutional:      General: She is not in acute distress.    Appearance: She is ill-appearing. She is not toxic-appearing or diaphoretic.  HENT:     Head: Normocephalic and atraumatic.     Right Ear: Tympanic membrane, ear canal and external ear normal.     Left Ear: Tympanic membrane, ear canal and external ear normal.     Nose: Congestion present.     Mouth/Throat:     Mouth: Mucous membranes are moist.     Pharynx: Oropharynx is clear. No oropharyngeal exudate or posterior oropharyngeal erythema.  Eyes:     General:        Right eye: No discharge.        Left eye: No discharge.     Conjunctiva/sclera:     Right eye: Right conjunctiva is  injected.     Left eye: Left conjunctiva is injected.  Cardiovascular:     Rate and Rhythm: Normal rate and regular rhythm.     Heart sounds: Normal heart sounds. No murmur heard. Pulmonary:     Effort: Pulmonary effort is normal. No respiratory distress.     Breath sounds: Normal breath sounds. No wheezing, rhonchi or rales.  Abdominal:     General: Bowel sounds are normal. There is no distension.     Palpations: Abdomen is soft.     Tenderness: There is no abdominal tenderness. There is no guarding or rebound.  Musculoskeletal:     Cervical back: Neck supple. No rigidity.  Lymphadenopathy:     Cervical: No cervical adenopathy.  Skin:    General: Skin is warm and dry.  Neurological:     Mental Status: She is alert and oriented to person, place, and time.  Psychiatric:        Mood and Affect: Mood normal.        Behavior: Behavior normal.     No results found for any visits on 11/11/23.      Assessment & Plan:   Ann Peterson was seen today for fever.  Diagnoses and all orders for this visit:  Viral illness Negative rapid flu. Covid/flu/RSV PCR pending. Hx of pneumonia- discussed CXR if shortness of breath, productive cough develop. Discussed symptomatic care and return precautions.  -     Veritor Flu A/B Waived -     COVID-19, Flu A+B and RSV   Return if symptoms worsen or fail to improve.  The patient indicates understanding of these issues and agrees with the plan.  Gabriel Earing, FNP

## 2023-11-13 ENCOUNTER — Other Ambulatory Visit: Payer: Self-pay | Admitting: Family Medicine

## 2023-11-13 ENCOUNTER — Telehealth: Payer: Self-pay | Admitting: Family Medicine

## 2023-11-13 DIAGNOSIS — U071 COVID-19: Secondary | ICD-10-CM

## 2023-11-13 LAB — COVID-19, FLU A+B AND RSV
Influenza A, NAA: NOT DETECTED
Influenza B, NAA: NOT DETECTED
RSV, NAA: NOT DETECTED
SARS-CoV-2, NAA: DETECTED — AB

## 2023-11-13 MED ORDER — NIRMATRELVIR/RITONAVIR (PAXLOVID)TABLET: 3.0000 | ORAL_TABLET | Freq: Two times a day (BID) | ORAL | 0 refills | Status: AC

## 2023-11-13 MED ORDER — MOLNUPIRAVIR EUA 200MG CAPSULE: 4.0000 | ORAL_CAPSULE | Freq: Two times a day (BID) | ORAL | 0 refills | Status: DC

## 2023-11-13 NOTE — Telephone Encounter (Signed)
 I've sent in paxloid instead. This is the only other antiviral for covid. Unfortunately, some insurances do not cover these medications.   She will need to take a 1/2 dose of her amlodipine and trazodone take taking paxlovid. Monitor BP. Also hold flomax while taking paxlovid and for 3 days after.

## 2023-11-13 NOTE — Telephone Encounter (Signed)
 Copied from CRM [id number]. Topic: Clinical - Medication Question >> Nov 13, 2023 11:07 AM Priscille Loveless wrote: Reason for CRM: Pt called and stated that she had tested for covid and medicine was called in to local pharmacy but it is 700.00 with insurance and was wondering if  the dr would call something else in. Thank you!

## 2023-11-19 ENCOUNTER — Other Ambulatory Visit (HOSPITAL_COMMUNITY): Payer: Self-pay

## 2023-12-02 ENCOUNTER — Other Ambulatory Visit: Payer: Self-pay

## 2024-01-10 ENCOUNTER — Other Ambulatory Visit: Payer: Self-pay | Admitting: Family Medicine

## 2024-01-10 DIAGNOSIS — E890 Postprocedural hypothyroidism: Secondary | ICD-10-CM

## 2024-01-11 ENCOUNTER — Other Ambulatory Visit (HOSPITAL_COMMUNITY): Payer: Self-pay

## 2024-01-13 ENCOUNTER — Other Ambulatory Visit: Payer: Self-pay

## 2024-01-13 ENCOUNTER — Other Ambulatory Visit (HOSPITAL_COMMUNITY): Payer: Self-pay

## 2024-01-13 MED ORDER — LEVOTHYROXINE SODIUM 175 MCG PO TABS
175.0000 ug | ORAL_TABLET | Freq: Every day | ORAL | 0 refills | Status: DC
  Filled 2024-01-13: qty 30, 30d supply, fill #0

## 2024-02-02 ENCOUNTER — Other Ambulatory Visit: Payer: Self-pay

## 2024-02-16 ENCOUNTER — Other Ambulatory Visit: Payer: Self-pay | Admitting: Family Medicine

## 2024-02-16 DIAGNOSIS — E890 Postprocedural hypothyroidism: Secondary | ICD-10-CM

## 2024-02-17 ENCOUNTER — Encounter: Payer: Self-pay | Admitting: Family Medicine

## 2024-02-17 ENCOUNTER — Other Ambulatory Visit (HOSPITAL_COMMUNITY): Payer: Self-pay

## 2024-02-17 MED ORDER — LEVOTHYROXINE SODIUM 175 MCG PO TABS
175.0000 ug | ORAL_TABLET | Freq: Every day | ORAL | 1 refills | Status: DC
  Filled 2024-02-17: qty 30, 30d supply, fill #0
  Filled 2024-03-03 – 2024-03-26 (×3): qty 30, 30d supply, fill #1

## 2024-02-17 NOTE — Telephone Encounter (Signed)
 I called pt & made her a CPE appt on 03-26-2024 w/Dr G.

## 2024-02-17 NOTE — Telephone Encounter (Signed)
 [place] pt NTBS 30-d given 01/13/24

## 2024-02-17 NOTE — Addendum Note (Signed)
 Addended by: Kyung Muto D on: 02/17/2024 11:46 AM   Modules accepted: Orders

## 2024-03-03 ENCOUNTER — Other Ambulatory Visit: Payer: Self-pay | Admitting: Family Medicine

## 2024-03-03 ENCOUNTER — Other Ambulatory Visit (HOSPITAL_COMMUNITY): Payer: Self-pay

## 2024-03-03 DIAGNOSIS — Z7985 Long-term (current) use of injectable non-insulin antidiabetic drugs: Secondary | ICD-10-CM

## 2024-03-03 MED ORDER — MOUNJARO 12.5 MG/0.5ML ~~LOC~~ SOAJ
12.5000 mg | SUBCUTANEOUS | 0 refills | Status: DC
  Filled 2024-03-03: qty 6, 84d supply, fill #0

## 2024-03-26 ENCOUNTER — Ambulatory Visit: Admitting: Family Medicine

## 2024-03-26 ENCOUNTER — Ambulatory Visit

## 2024-03-26 ENCOUNTER — Other Ambulatory Visit (HOSPITAL_COMMUNITY): Payer: Self-pay

## 2024-03-26 VITALS — BP 110/69 | HR 61 | Temp 98.2°F | Ht 63.0 in | Wt 177.0 lb

## 2024-03-26 DIAGNOSIS — E1169 Type 2 diabetes mellitus with other specified complication: Secondary | ICD-10-CM

## 2024-03-26 DIAGNOSIS — I152 Hypertension secondary to endocrine disorders: Secondary | ICD-10-CM

## 2024-03-26 DIAGNOSIS — E890 Postprocedural hypothyroidism: Secondary | ICD-10-CM | POA: Diagnosis not present

## 2024-03-26 DIAGNOSIS — Z789 Other specified health status: Secondary | ICD-10-CM

## 2024-03-26 DIAGNOSIS — Z23 Encounter for immunization: Secondary | ICD-10-CM | POA: Diagnosis not present

## 2024-03-26 DIAGNOSIS — E785 Hyperlipidemia, unspecified: Secondary | ICD-10-CM | POA: Diagnosis not present

## 2024-03-26 DIAGNOSIS — E1159 Type 2 diabetes mellitus with other circulatory complications: Secondary | ICD-10-CM | POA: Diagnosis not present

## 2024-03-26 DIAGNOSIS — I700 Atherosclerosis of aorta: Secondary | ICD-10-CM | POA: Diagnosis not present

## 2024-03-26 DIAGNOSIS — M150 Primary generalized (osteo)arthritis: Secondary | ICD-10-CM

## 2024-03-26 DIAGNOSIS — E119 Type 2 diabetes mellitus without complications: Secondary | ICD-10-CM | POA: Diagnosis not present

## 2024-03-26 DIAGNOSIS — E66811 Obesity, class 1: Secondary | ICD-10-CM

## 2024-03-26 DIAGNOSIS — L659 Nonscarring hair loss, unspecified: Secondary | ICD-10-CM

## 2024-03-26 DIAGNOSIS — Z7985 Long-term (current) use of injectable non-insulin antidiabetic drugs: Secondary | ICD-10-CM

## 2024-03-26 DIAGNOSIS — F5101 Primary insomnia: Secondary | ICD-10-CM

## 2024-03-26 DIAGNOSIS — Z1211 Encounter for screening for malignant neoplasm of colon: Secondary | ICD-10-CM

## 2024-03-26 DIAGNOSIS — K760 Fatty (change of) liver, not elsewhere classified: Secondary | ICD-10-CM | POA: Diagnosis not present

## 2024-03-26 DIAGNOSIS — R110 Nausea: Secondary | ICD-10-CM | POA: Diagnosis not present

## 2024-03-26 DIAGNOSIS — Z0001 Encounter for general adult medical examination with abnormal findings: Secondary | ICD-10-CM

## 2024-03-26 DIAGNOSIS — Z0000 Encounter for general adult medical examination without abnormal findings: Secondary | ICD-10-CM

## 2024-03-26 LAB — BAYER DCA HB A1C WAIVED: HB A1C (BAYER DCA - WAIVED): 5.4 % (ref 4.8–5.6)

## 2024-03-26 LAB — LIPID PANEL

## 2024-03-26 MED ORDER — KETOCONAZOLE 2 % EX SHAM: MEDICATED_SHAMPOO | CUTANEOUS | 2 refills | Status: DC

## 2024-03-26 MED ORDER — CELECOXIB 50 MG PO CAPS
50.0000 mg | ORAL_CAPSULE | Freq: Two times a day (BID) | ORAL | 99 refills | Status: AC | PRN
  Filled 2024-05-13: qty 180, 90d supply, fill #0
  Filled 2024-09-24 – 2024-09-28 (×2): qty 90, 45d supply, fill #1

## 2024-03-26 MED ORDER — ONDANSETRON 4 MG PO TBDP: 4.0000 mg | ORAL_TABLET | Freq: Three times a day (TID) | ORAL | 1 refills | Status: AC | PRN

## 2024-03-26 MED ORDER — TRAZODONE HCL 50 MG PO TABS: 25.0000 mg | ORAL_TABLET | Freq: Every evening | ORAL | 0 refills | Status: DC | PRN

## 2024-03-26 MED ORDER — MOUNJARO 12.5 MG/0.5ML ~~LOC~~ SOAJ
12.5000 mg | SUBCUTANEOUS | 4 refills | Status: AC
  Filled 2024-05-13 – 2024-05-15 (×2): qty 6, 84d supply, fill #0
  Filled 2024-08-12: qty 6, 84d supply, fill #1
  Filled 2024-09-24: qty 2, 28d supply, fill #2

## 2024-03-26 MED ORDER — METOPROLOL SUCCINATE ER 100 MG PO TB24
100.0000 mg | ORAL_TABLET | Freq: Every day | ORAL | 4 refills | Status: AC
  Filled 2024-05-27: qty 90, 90d supply, fill #0
  Filled 2024-08-22: qty 90, 90d supply, fill #1

## 2024-03-26 MED ORDER — AMLODIPINE BESYLATE 10 MG PO TABS
10.0000 mg | ORAL_TABLET | Freq: Every day | ORAL | 4 refills | Status: AC
  Filled 2024-05-13: qty 90, 90d supply, fill #0
  Filled 2024-08-12: qty 90, 90d supply, fill #1

## 2024-03-26 MED ORDER — LEVOTHYROXINE SODIUM 175 MCG PO TABS: 175.0000 ug | ORAL_TABLET | Freq: Every day | ORAL | 4 refills | Status: DC

## 2024-03-26 NOTE — Progress Notes (Signed)
 Ann Peterson is a 58 y.o. female presents to office today for annual physical exam examination.    Concerns today include: 1. Type 2 Diabetes with hypertension, hyperlipidemia:  Compliant with all meds.  She has had some increase stress since her last visit but otherwise reports no concerns.  Last eye exam: needs Last foot exam: UTD Last A1c:  Lab Results  Component Value Date   HGBA1C 5.5 09/17/2023   Nephropathy screen indicated?: UTD Last flu, zoster and/or pneumovax:  Immunization History  Administered Date(s) Administered   Influenza-Unspecified 06/10/2017, 07/08/2023   PFIZER(Purple Top)SARS-COV-2 Vaccination 09/23/2019, 11/03/2019, 06/24/2020   Tdap 01/10/2010   Zoster Recombinant(Shingrix ) 09/17/2023    ROS: No chest pain, shortness of breath, visual disturbance.  She has had some occasional nausea  Occupation: Engineer, civil (consulting), Marital status: widowed, seeing someone new, Substance use: none Health Maintenance Due  Topic Date Due   OPHTHALMOLOGY EXAM  Never done   Pneumococcal Vaccine 44-39 Years old (1 of 2 - PCV) Never done   Hepatitis B Vaccines (1 of 3 - 19+ 3-dose series) Never done   DTaP/Tdap/Td (2 - Td or Tdap) 01/11/2020   COVID-19 Vaccine (4 - 2024-25 season) 05/12/2023   Colonoscopy  06/17/2023   Zoster Vaccines- Shingrix  (2 of 2) 11/12/2023   HEMOGLOBIN A1C  03/16/2024   Refills needed today: All  Immunization History  Administered Date(s) Administered   Influenza-Unspecified 06/10/2017, 07/08/2023   PFIZER(Purple Top)SARS-COV-2 Vaccination 09/23/2019, 11/03/2019, 06/24/2020   Tdap 01/10/2010   Zoster Recombinant(Shingrix ) 09/17/2023   Past Medical History:  Diagnosis Date   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   COVID-19 12/03/2019   body aches, joint pain, fever 104 x 4 days, then symptoms resolved   History of kidney stones    Hyperlipidemia    Hypertension    PONV (postoperative nausea and vomiting)    n/v after appendectomy 5-6 yrs  ago   Sleep apnea    uses cpap   Thyroid  disease    thyroidectomy due to cancer   Social History   Socioeconomic History   Marital status: Widowed    Spouse name: Not on file   Number of children: Not on file   Years of education: Not on file   Highest education level: Associate degree: academic program  Occupational History   Not on file  Tobacco Use   Smoking status: Never   Smokeless tobacco: Never  Vaping Use   Vaping status: Never Used  Substance and Sexual Activity   Alcohol use: No   Drug use: No   Sexual activity: Not Currently    Birth control/protection: Surgical  Other Topics Concern   Not on file  Social History Narrative   Not on file   Social Drivers of Health   Financial Resource Strain: Low Risk  (03/26/2024)   Overall Financial Resource Strain (CARDIA)    Difficulty of Paying Living Expenses: Not hard at all  Food Insecurity: No Food Insecurity (03/26/2024)   Hunger Vital Sign    Worried About Running Out of Food in the Last Year: Never true    Ran Out of Food in the Last Year: Never true  Transportation Needs: No Transportation Needs (03/26/2024)   PRAPARE - Administrator, Civil Service (Medical): No    Lack of Transportation (Non-Medical): No  Physical Activity: Insufficiently Active (03/26/2024)   Exercise Vital Sign    Days of Exercise per Week: 1 day    Minutes of Exercise per Session:  10 min  Stress: No Stress Concern Present (03/26/2024)   [employer] of Occupational Health - Occupational Stress Questionnaire    Feeling of Stress: Only a little  Social Connections: Moderately Integrated (03/26/2024)   Social Connection and Isolation Panel    Frequency of Communication with Friends and Family: More than three times a week    Frequency of Social Gatherings with Friends and Family: More than three times a week    Attends Religious Services: More than 4 times per year    Active Member of [employer] or Organizations: Yes    Attends  Banker Meetings: More than 4 times per year    Marital Status: Widowed  Intimate Partner Violence: Not At Risk (09/17/2023)   Humiliation, Afraid, Rape, and Kick questionnaire    Fear of Current or Ex-Partner: No    Emotionally Abused: No    Physically Abused: No    Sexually Abused: No   Past Surgical History:  Procedure Laterality Date   ABDOMINAL HYSTERECTOMY  09/12/2016   partial   APPENDECTOMY  2011   BACK SURGERY     CESAREAN SECTION     x 3   CHOLECYSTECTOMY  2000   laproscopic   CYSTOSCOPY W/ URETERAL STENT PLACEMENT N/A 12/03/2019   Procedure: CYSTOSCOPY WITH STENT PLACEMENT, RETROGRADE;  Surgeon: Sherrilee Belvie CROME, MD;  Location: [facility];  Service: Urology;  Laterality: N/A;   CYSTOSCOPY WITH RETROGRADE PYELOGRAM, URETEROSCOPY AND STENT PLACEMENT Left 12/24/2019   Procedure: CYSTOSCOPY WITH RETROGRADE PYELOGRAM, URETEROSCOPY, STENT REMOVAL AND STONE EXTRACTION;  Surgeon: Sherrilee Belvie CROME, MD;  Location: [facility];  Service: Urology;  Laterality: Left;   CYSTOSCOPY WITH RETROGRADE PYELOGRAM, URETEROSCOPY AND STENT PLACEMENT Bilateral 05/04/2021   Procedure: CYSTOSCOPY WITH RETROGRADE PYELOGRAM, URETEROSCOPY AND STENT PLACEMENT;  Surgeon: Sherrilee Belvie CROME, MD;  Location: [facility];  Service: Urology;  Laterality: Bilateral;   discectomy     HOLMIUM LASER APPLICATION Bilateral 05/04/2021   Procedure: HOLMIUM LASER APPLICATION;  Surgeon: Sherrilee Belvie CROME, MD;  Location: [facility];  Service: Urology;  Laterality: Bilateral;   TOTAL THYROIDECTOMY  2000   sx and radiation done   Family History  Problem Relation Age of Onset   Hypertension Mother    Dementia Mother    Macular degeneration Mother    Stroke Mother    COPD Father        lung cancer   Cancer Brother        colon cancer   Cancer Brother        brain tumor    Current Outpatient Medications:    albuterol  (VENTOLIN  HFA) 108 (90 Base) MCG/ACT inhaler, Inhale 2 puffs into the  lungs every 6 (six) hours as needed for wheezing or shortness of breath., Disp: 8 g, Rfl: 2   BIOTIN PO, Take 1 capsule by mouth every evening., Disp: , Rfl:    blood glucose meter kit and supplies, Use up to four times daily as directed., Disp: 1 each, Rfl: 0   Cholecalciferol (VITAMIN D ) 50 MCG (2000 UT) CAPS, Take 1 capsule by mouth every evening., Disp: , Rfl:    co-enzyme Q-10 30 MG capsule, Take 30 mg by mouth daily., Disp: , Rfl:    fluticasone  (FLONASE ) 50 MCG/ACT nasal spray, Place 2 sprays into both nostrils daily., Disp: 48 g, Rfl: 1   glucose blood test strip, Use as instructed to check blood sugar daily., Disp: 100 each, Rfl: PRN   indapamide  (LOZOL ) 2.5 MG tablet,  TAKE 1 TABLET BY MOUTH EVERY DAY, Disp: 90 tablet, Rfl: 3   ketoconazole  (NIZORAL ) 2 % shampoo, Lather affected areas and leave on for 10 mins. Then rinse.  Use twice weekly until rash resolves., Disp: 120 mL, Rfl: 2   Lancets (FREESTYLE) lancets, Test blood glucose once daily as directed., Disp: 100 each, Rfl: PRN   levothyroxine  (SYNTHROID ) 175 MCG tablet, Take 1 tablet (175 mcg total) by mouth daily., Disp: 30 tablet, Rfl: 1   magnesium oxide (MAG-OX) 400 MG tablet, Take 400 mg by mouth daily., Disp: , Rfl:    amLODipine  (NORVASC ) 10 MG tablet, Take 1 tablet (10 mg total) by mouth daily., Disp: 90 tablet, Rfl: 4   celecoxib  (CELEBREX ) 50 MG capsule, Take 1 capsule (50 mg total) by mouth 2 (two) times daily as needed for pain., Disp: 180 capsule, Rfl: PRN   levothyroxine  (SYNTHROID ) 175 MCG tablet, Take 1 tablet (175 mcg total) by mouth daily., Disp: 90 tablet, Rfl: 4   metoprolol  succinate (TOPROL -XL) 100 MG 24 hr tablet, Take 1 tablet (100 mg total) by mouth daily. With or immediately following a meal, Disp: 90 tablet, Rfl: 4   ondansetron  (ZOFRAN -ODT) 4 MG disintegrating tablet, Take 1 tablet (4 mg total) by mouth every 8 (eight) hours as needed for nausea or vomiting., Disp: 20 tablet, Rfl: 1   tirzepatide  (MOUNJARO )  12.5 MG/0.5ML Pen, Inject 12.5 mg into the skin once a week., Disp: 6 mL, Rfl: 4   traZODone  (DESYREL ) 50 MG tablet, Take 0.5-1 tablets (25-50 mg total) by mouth at bedtime as needed for sleep (wean as tolerated)., Disp: 90 tablet, Rfl: 0  Allergies  Allergen Reactions   Pollen Extract      ROS: Review of Systems Pertinent items noted in HPI and remainder of comprehensive ROS otherwise negative.    Physical exam BP 110/69   Pulse 61   Temp 98.2 F (36.8 C)   Ht 5' 3 (1.6 m)   Wt 177 lb (80.3 kg)   LMP 09/07/2016 (Exact Date)   SpO2 95%   BMI 31.35 kg/m  General appearance: alert, cooperative, appears stated age, and mildly obese Head: Normocephalic, without obvious abnormality, atraumatic, hair thinning present Eyes: negative findings: lids and lashes normal, conjunctivae and sclerae normal, corneas clear, and pupils equal, round, reactive to light and accomodation Ears: normal TM's and external ear canals both ears Nose: Nares normal. Septum midline. Mucosa normal. No drainage or sinus tenderness. Throat: lips, mucosa, and tongue normal; teeth and gums normal Neck: no adenopathy, no carotid bruit, supple, symmetrical, trachea midline, and thyroid  surgically absent with well-healed scar at the base of her neck anteriorly Back: symmetric, no curvature. ROM normal. No CVA tenderness. Lungs: clear to auscultation bilaterally Heart: regular rate and rhythm, S1, S2 normal, no murmur, click, rub or gallop Abdomen: soft, non-tender; bowel sounds normal; no masses,  no organomegaly Extremities: extremities normal, atraumatic, no cyanosis or edema Pulses: 2+ and symmetric Skin: Skin color, texture, turgor normal. No rashes or lesions Lymph nodes: Cervical, supraclavicular, and axillary nodes normal. Neurologic: Grossly normal      03/26/2024    8:27 AM 11/11/2023    1:19 PM 09/17/2023    2:14 PM  Depression screen PHQ 2/9  Decreased Interest 0 0 0  Down, Depressed, Hopeless 0 0 0   PHQ - 2 Score 0 0 0  Altered sleeping 0 0 0  Tired, decreased energy 0 0 0  Change in appetite 0 0 0  Feeling bad or  failure about yourself  0 0 0  Trouble concentrating 0 0 0  Moving slowly or fidgety/restless 0 0 0  Suicidal thoughts 0 0 0  PHQ-9 Score 0 0 0  Difficult doing work/chores Not difficult at all Not difficult at all Not difficult at all      03/26/2024    8:27 AM 11/11/2023    1:19 PM 09/17/2023    2:14 PM 11/30/2022    2:51 PM  GAD 7 : Generalized Anxiety Score  Nervous, Anxious, on Edge 0 0 0 0  Control/stop worrying 0 0 0 0  Worry too much - different things 0 0 0 0  Trouble relaxing 0 0 0 0  Restless 0 0 0 0  Easily annoyed or irritable 0 0 0 0  Afraid - awful might happen 0 0 0 0  Total GAD 7 Score 0 0 0 0  Anxiety Difficulty Not difficult at all Not difficult at all Not difficult at all Not difficult at all     Assessment/ Plan: Ann Peterson here for annual physical exam.   Annual physical exam  Diabetes mellitus treated with injections of non-insulin  medication (HCC) - Plan: Bayer DCA Hb A1c Waived, CMP14+EGFR, CBC, tirzepatide  (MOUNJARO ) 12.5 MG/0.5ML Pen  Hyperlipidemia associated with type 2 diabetes mellitus (HCC) - Plan: Lipid Panel, CMP14+EGFR  Aortic atherosclerosis (HCC) - Plan: Lipid Panel, CMP14+EGFR  Hypertension associated with diabetes (HCC) - Plan: CMP14+EGFR, amLODipine  (NORVASC ) 10 MG tablet, metoprolol  succinate (TOPROL -XL) 100 MG 24 hr tablet  Postoperative hypothyroidism - Plan: CMP14+EGFR, TSH + free T4, levothyroxine  (SYNTHROID ) 175 MCG tablet  Obesity, Class I, BMI 30-34.9 - Plan: CMP14+EGFR  Hepatitis B vaccination status unknown - Plan: Hepatitis B surface antibody,quantitative  Screening for colon cancer - Plan: Ambulatory referral to Gastroenterology  Hair thinning - Plan: ketoconazole  (NIZORAL ) 2 % shampoo  Primary osteoarthritis involving multiple joints - Plan: celecoxib  (CELEBREX ) 50 MG capsule  Nausea - Plan:  ondansetron  (ZOFRAN -ODT) 4 MG disintegrating tablet  Primary insomnia - Plan: traZODone  (DESYREL ) 50 MG tablet  Shingles vaccination administered  Sugar well-controlled.  Fasting labs collected.  Continue all medications as prescribed.  Again need to really consider initiation of statin therapy  Clinically euthymic except for hair loss.  Continue thyroid  medications.  Check thyroid  levels  Hepatitis B surface antibody ordered.  Vaccination pending result  Referral to gastroenterology for colon cancer screening  Trial of Nizoral .  Discussed options.  Has referral placed for dermatology in [place]  Continue Celebrex  as needed  Starting wean from trazodone .  Discussed how to do this  Counseled on healthy lifestyle choices, including diet (rich in fruits, vegetables and lean meats and low in salt and simple carbohydrates) and exercise (at least 30 minutes of moderate physical activity daily).  Patient to follow up 40m  Ann Meuth M. Jolinda, DO

## 2024-03-27 ENCOUNTER — Ambulatory Visit: Payer: Self-pay | Admitting: Family Medicine

## 2024-03-27 DIAGNOSIS — E1169 Type 2 diabetes mellitus with other specified complication: Secondary | ICD-10-CM

## 2024-03-27 LAB — CMP14+EGFR
ALT: 37 IU/L — ABNORMAL HIGH (ref 0–32)
AST: 28 IU/L (ref 0–40)
Albumin: 4.7 g/dL (ref 3.8–4.9)
Alkaline Phosphatase: 81 IU/L (ref 44–121)
BUN/Creatinine Ratio: 18 (ref 9–23)
BUN: 16 mg/dL (ref 6–24)
Bilirubin Total: 0.5 mg/dL (ref 0.0–1.2)
CO2: 27 mmol/L (ref 20–29)
Calcium: 10 mg/dL (ref 8.7–10.2)
Chloride: 98 mmol/L (ref 96–106)
Creatinine, Ser: 0.9 mg/dL (ref 0.57–1.00)
Globulin, Total: 2.4 g/dL (ref 1.5–4.5)
Glucose: 107 mg/dL — ABNORMAL HIGH (ref 70–99)
Potassium: 3.8 mmol/L (ref 3.5–5.2)
Sodium: 140 mmol/L (ref 134–144)
Total Protein: 7.1 g/dL (ref 6.0–8.5)
eGFR: 75 mL/min/1.73 (ref >59)

## 2024-03-27 LAB — TSH+FREE T4
Free T4: 1.67 ng/dL (ref 0.82–1.77)
TSH: 3.19 u[IU]/mL (ref 0.450–4.500)

## 2024-03-27 LAB — CBC
Hematocrit: 46.5 % (ref 34.0–46.6)
Hemoglobin: 15.2 g/dL (ref 11.1–15.9)
MCH: 30.9 pg (ref 26.6–33.0)
MCHC: 32.7 g/dL (ref 31.5–35.7)
MCV: 95 fL (ref 79–97)
Platelets: 166 x10E3/uL (ref 150–450)
RBC: 4.92 x10E6/uL (ref 3.77–5.28)
RDW: 13.3 % (ref 11.7–15.4)
WBC: 6.6 x10E3/uL (ref 3.4–10.8)

## 2024-03-27 LAB — LIPID PANEL
Chol/HDL Ratio: 5.8 ratio — ABNORMAL HIGH (ref 0.0–4.4)
Cholesterol, Total: 257 mg/dL — ABNORMAL HIGH (ref 100–199)
HDL: 44 mg/dL (ref >39)
LDL Chol Calc (NIH): 177 mg/dL — ABNORMAL HIGH (ref 0–99)
Triglycerides: 192 mg/dL — ABNORMAL HIGH (ref 0–149)
VLDL Cholesterol Cal: 36 mg/dL (ref 5–40)

## 2024-03-27 LAB — HEPATITIS B SURFACE ANTIBODY, QUANTITATIVE: Hepatitis B Surf Ab Quant: 482 m[IU]/mL

## 2024-03-30 MED ORDER — ROSUVASTATIN CALCIUM 20 MG PO TABS
20.0000 mg | ORAL_TABLET | Freq: Every day | ORAL | 3 refills | Status: AC
  Filled 2024-05-13: qty 90, 90d supply, fill #0
  Filled 2024-08-12: qty 90, 90d supply, fill #1

## 2024-03-30 NOTE — Telephone Encounter (Signed)
 Yes, elevated LFTs are probably reflective of uncontrolled HLD and fatty liver deposition

## 2024-04-08 ENCOUNTER — Telehealth: Payer: Self-pay | Admitting: Internal Medicine

## 2024-04-08 DIAGNOSIS — Z860101 Personal history of adenomatous and serrated colon polyps: Secondary | ICD-10-CM

## 2024-04-08 DIAGNOSIS — Z800 Family history of malignant neoplasm of digestive organs: Secondary | ICD-10-CM

## 2024-04-08 NOTE — Telephone Encounter (Signed)
 Good Morning Dr. Avram,  Patient is being referred for colonoscopy, her last procedure was in 2021 with Dr. Luis. Patient requesting transfer of care due to [facility] retiring, records are available for you review in EPIC under media tab. Please advise further on scheduling.   Thank you

## 2024-04-09 ENCOUNTER — Encounter: Payer: Self-pay | Admitting: Internal Medicine

## 2024-04-09 DIAGNOSIS — Z800 Family history of malignant neoplasm of digestive organs: Secondary | ICD-10-CM | POA: Insufficient documentation

## 2024-04-09 DIAGNOSIS — Z860101 Personal history of adenomatous and serrated colon polyps: Secondary | ICD-10-CM | POA: Insufficient documentation

## 2024-04-09 NOTE — Telephone Encounter (Signed)
 OK to schedule colonoscopy w/ me in [facility]   Encounter Diagnoses  Name Primary?   Family history of colon cancer Yes   Hx of adenomatous colonic polyps

## 2024-04-21 ENCOUNTER — Other Ambulatory Visit (HOSPITAL_COMMUNITY): Payer: Self-pay

## 2024-04-21 ENCOUNTER — Other Ambulatory Visit: Payer: Self-pay | Admitting: Family Medicine

## 2024-04-21 DIAGNOSIS — E890 Postprocedural hypothyroidism: Secondary | ICD-10-CM

## 2024-04-21 DIAGNOSIS — J302 Other seasonal allergic rhinitis: Secondary | ICD-10-CM

## 2024-04-21 MED ORDER — FLUTICASONE PROPIONATE 50 MCG/ACT NA SUSP
2.0000 | Freq: Every day | NASAL | 1 refills | Status: AC
  Filled 2024-04-21 – 2024-07-15 (×2): qty 48, 90d supply, fill #0

## 2024-04-21 MED ORDER — LEVOTHYROXINE SODIUM 175 MCG PO TABS
175.0000 ug | ORAL_TABLET | Freq: Every day | ORAL | 3 refills | Status: AC
  Filled 2024-04-21 – 2024-05-27 (×2): qty 90, 90d supply, fill #0

## 2024-04-29 NOTE — Telephone Encounter (Signed)
 Called patient to discuss scheduling. Unable to leave voicemail due to mailbox being full

## 2024-05-01 ENCOUNTER — Other Ambulatory Visit (HOSPITAL_COMMUNITY): Payer: Self-pay

## 2024-05-13 ENCOUNTER — Other Ambulatory Visit (HOSPITAL_COMMUNITY): Payer: Self-pay

## 2024-05-13 ENCOUNTER — Other Ambulatory Visit: Payer: Self-pay

## 2024-05-13 MED ORDER — LEVOTHYROXINE SODIUM 175 MCG PO TABS
175.0000 ug | ORAL_TABLET | Freq: Every day | ORAL | 4 refills | Status: AC
  Filled 2024-09-24: qty 90, 90d supply, fill #0

## 2024-05-14 ENCOUNTER — Other Ambulatory Visit (HOSPITAL_BASED_OUTPATIENT_CLINIC_OR_DEPARTMENT_OTHER): Payer: Self-pay

## 2024-05-14 ENCOUNTER — Ambulatory Visit: Admitting: Family Medicine

## 2024-05-14 ENCOUNTER — Encounter: Payer: Self-pay | Admitting: Family Medicine

## 2024-05-14 ENCOUNTER — Ambulatory Visit: Payer: Self-pay

## 2024-05-14 VITALS — BP 120/65 | HR 66 | Temp 98.4°F | Resp 16 | Ht 63.0 in | Wt 179.4 lb

## 2024-05-14 DIAGNOSIS — B349 Viral infection, unspecified: Secondary | ICD-10-CM

## 2024-05-14 DIAGNOSIS — J029 Acute pharyngitis, unspecified: Secondary | ICD-10-CM | POA: Insufficient documentation

## 2024-05-14 MED ORDER — LIDOCAINE VISCOUS HCL 2 % MT SOLN
15.0000 mL | OROMUCOSAL | 0 refills | Status: DC | PRN
  Filled 2024-05-14: qty 100, 7d supply, fill #0

## 2024-05-14 MED ORDER — PREDNISONE 10 MG (21) PO TBPK
ORAL_TABLET | ORAL | 0 refills | Status: DC
  Filled 2024-05-14: qty 21, 6d supply, fill #0

## 2024-05-14 NOTE — Telephone Encounter (Signed)
 FYI Only or Action Required?: FYI only for provider.  Patient was last seen in primary care on 03/26/2024 by [name], DO.  Called Nurse Triage reporting Sore Throat.  Symptoms began yesterday.  Interventions attempted: OTC medications: Tylenol .  Symptoms are: worsening.Stuffy nose  Triage Disposition: See Physician Within 24 Hours  Patient/caregiver understands and will follow disposition?: Yes                      >> May 14, 2024  8:02 AM Antwanette L wrote: Red Word that prompted transfer to Nurse Triage: Patient is experiencing a severe sore throat and its painful to swallow Reason for Disposition  SEVERE throat pain (e.g., excruciating)  Answer Assessment - Initial Assessment Questions 1. ONSET: When did the throat start hurting? (Hours or days ago)      Yesterday 2. SEVERITY: How bad is the sore throat? (Scale 1-10; mild, moderate or severe)     10/10 - can barely swallow 3. STREP EXPOSURE: Has there been any exposure to strep within the past week? If Yes, ask: What type of contact occurred?      unsure 4.  VIRAL SYMPTOMS: Are there any symptoms of a cold, such as a runny nose, cough, hoarse voice or red eyes?      Stuffy nose 5. FEVER: Do you have a fever? If Yes, ask: What is your temperature, how was it measured, and when did it start?     Did last night 6. PUS ON THE TONSILS: Is there pus on the tonsils in the back of your throat?     Look red 7. OTHER SYMPTOMS: Do you have any other symptoms? (e.g., difficulty breathing, headache, rash)      Stuffy nose  Protocols used: Sore Throat-A-AH

## 2024-05-14 NOTE — Assessment & Plan Note (Signed)
 COVID-19, Flu A+B RSV and Rapid Strep test ordered    Lidocaine  viscous solution PRN and Start steroid starter pack

## 2024-05-14 NOTE — Progress Notes (Signed)
 Established Patient Office Visit   Subjective  Patient ID: Ann Peterson, female    DOB: 10/27/1965  Age: 58 y.o. MRN: [id number]  Chief Complaint  Patient presents with   Sore Throat    Started yesterday with sore throat, hurts bad to swallow, some sinus drainage for a day or two and a slight cough but nothing productive     She  has a past medical history of Cancer (HCC) (2000), COVID-19 (12/03/2019), History of kidney stones, Hyperlipidemia, Hypertension, PONV (postoperative nausea and vomiting), Sleep apnea, and Thyroid  disease.  The patient reports a low-grade fever accompanied by dry cough, fatigue, headache, malaise, myalgias, and sore throat. Symptoms began 1 day ago and have been gradually worsening. The patient denies dyspnea or chest pain. Treatment thus far has included over-the-counter analgesics and antipyretics, which have been somewhat effective. Past pulmonary history is significant for occasional episodes of bronchitis and pneumonia    Review of Systems  Constitutional:  Positive for diaphoresis, fever and malaise/fatigue. Negative for chills.  HENT:  Positive for congestion, sinus pain and sore throat.   Respiratory:  Positive for cough. Negative for sputum production, shortness of breath and wheezing.   Cardiovascular:  Negative for chest pain.  Gastrointestinal:  Negative for vomiting.  Neurological:  Positive for headaches. Negative for dizziness.      Objective:     BP 120/65   Pulse 66   Temp 98.4 F (36.9 C) (Oral)   Resp 16   Ht 5' 3 (1.6 m)   Wt 179 lb 6.4 oz (81.4 kg)   LMP 09/07/2016 (Exact Date)   SpO2 93%   BMI 31.78 kg/m  BP Readings from Last 3 Encounters:  05/14/24 120/65  03/26/24 110/69  11/11/23 136/74      Physical Exam Vitals reviewed.  Constitutional:      General: She is not in acute distress.    Appearance: Normal appearance. She is not ill-appearing, toxic-appearing or diaphoretic.  HENT:     Head: Normocephalic.      Right Ear: Tympanic membrane normal.     Left Ear: Tympanic membrane normal.     Nose: Congestion and rhinorrhea present.     Mouth/Throat:     Mouth: Mucous membranes are moist.     Pharynx: Posterior oropharyngeal erythema present.  Eyes:     General:        Right eye: No discharge.        Left eye: No discharge.     Conjunctiva/sclera: Conjunctivae normal.  Cardiovascular:     Rate and Rhythm: Normal rate.     Pulses: Normal pulses.     Heart sounds: Normal heart sounds.  Pulmonary:     Effort: Pulmonary effort is normal. No respiratory distress.     Breath sounds: Normal breath sounds.  Abdominal:     Tenderness: There is no guarding.  Lymphadenopathy:     Cervical: Cervical adenopathy present.  Skin:    General: Skin is warm and dry.     Capillary Refill: Capillary refill takes less than 2 seconds.  Neurological:     General: No focal deficit present.     Mental Status: She is alert and oriented to person, place, and time.     Coordination: Coordination normal.     Gait: Gait normal.  Psychiatric:        Mood and Affect: Mood normal.        Behavior: Behavior normal.      No results found for  any visits on 05/14/24.  The 10-year ASCVD risk score (Arnett DK, et al., 2019) is: 8.3%    Assessment & Plan:  Viral illness -     COVID-19, Flu A+B and RSV -     Lidocaine  Viscous HCl; Use as directed 15 mLs in the mouth or throat as needed.  Dispense: 100 mL; Refill: 0 -     predniSONE ; Day 1: 6 tablets (60 mg) Day 2: 5 tablets (50 mg) Day 3: 4 tablets (40 mg) Day 4: 3 tablets (30 mg) Day 5: 2 tablets (20 mg) Day 6: 1 tablet (10 mg)  Dispense: 21 tablet; Refill: 0 -     Rapid Strep Screen ([facility] ONLY)  Sore throat Assessment & Plan: COVID-19, Flu A+B RSV and Rapid Strep test ordered    Lidocaine  viscous solution PRN and Start steroid starter pack     Return if symptoms worsen or fail to improve.   Hilario Kidd Wilhelmena Falter, FNP

## 2024-05-14 NOTE — Patient Instructions (Signed)

## 2024-05-14 NOTE — Telephone Encounter (Signed)
 Apt scheduled.

## 2024-05-15 ENCOUNTER — Other Ambulatory Visit (HOSPITAL_COMMUNITY): Payer: Self-pay

## 2024-05-15 ENCOUNTER — Encounter: Payer: Self-pay | Admitting: Family Medicine

## 2024-05-16 ENCOUNTER — Other Ambulatory Visit (HOSPITAL_COMMUNITY): Payer: Self-pay

## 2024-05-16 LAB — COVID-19, FLU A+B AND RSV
Influenza A, NAA: NOT DETECTED
Influenza B, NAA: NOT DETECTED
RSV, NAA: NOT DETECTED
SARS-CoV-2, NAA: NOT DETECTED

## 2024-05-16 LAB — CULTURE, GROUP A STREP

## 2024-05-16 LAB — RAPID STREP SCREEN (MED CTR MEBANE ONLY): Strep Gp A Ag, IA W/Reflex: NEGATIVE

## 2024-05-18 ENCOUNTER — Telehealth: Payer: Self-pay | Admitting: Family Medicine

## 2024-05-18 NOTE — Telephone Encounter (Unsigned)
 Copied from CRM #[id number]. Topic: Clinical - Lab/Test Results >> May 15, 2024  2:11 PM Winona R wrote: Pt calling to check the status of her lab results. Please call her as soon as labs come back as she states today would be the last day she can receive medication if her results comes back positive for COVID. >> May 15, 2024  2:15 PM CMA Powell H wrote: Send to correct office

## 2024-05-18 NOTE — Telephone Encounter (Unsigned)
 Copied from CRM #[id number]. Topic: Clinical - Lab/Test Results >> May 15, 2024  4:33 PM Zebedee SAUNDERS wrote: Reason for CRM: Pt calling for lab results from 05/14/2024, please call pt at [phone].

## 2024-05-21 ENCOUNTER — Ambulatory Visit (INDEPENDENT_AMBULATORY_CARE_PROVIDER_SITE_OTHER): Admitting: Gastroenterology

## 2024-05-21 ENCOUNTER — Encounter: Payer: Self-pay | Admitting: Gastroenterology

## 2024-05-21 ENCOUNTER — Other Ambulatory Visit: Payer: Self-pay

## 2024-05-21 ENCOUNTER — Other Ambulatory Visit (HOSPITAL_COMMUNITY): Payer: Self-pay

## 2024-05-21 VITALS — BP 126/74 | HR 60 | Ht 62.0 in | Wt 178.4 lb

## 2024-05-21 DIAGNOSIS — K219 Gastro-esophageal reflux disease without esophagitis: Secondary | ICD-10-CM

## 2024-05-21 DIAGNOSIS — Z860101 Personal history of adenomatous and serrated colon polyps: Secondary | ICD-10-CM | POA: Diagnosis not present

## 2024-05-21 DIAGNOSIS — Z800 Family history of malignant neoplasm of digestive organs: Secondary | ICD-10-CM

## 2024-05-21 DIAGNOSIS — R0789 Other chest pain: Secondary | ICD-10-CM

## 2024-05-21 DIAGNOSIS — J029 Acute pharyngitis, unspecified: Secondary | ICD-10-CM

## 2024-05-21 MED ORDER — SUCRALFATE 1 G PO TABS
1.0000 g | ORAL_TABLET | Freq: Three times a day (TID) | ORAL | 0 refills | Status: AC
  Filled 2024-05-21: qty 120, 30d supply, fill #0

## 2024-05-21 NOTE — Patient Instructions (Addendum)
 We have sent the following medications to your pharmacy for you to pick up at your convenience: Carafate  1 gram tablet dissolve in 10 ml of water  four times daily 20-30 minutes before meals and at bedtime.   Increase Omeprazole 20 mg OTC twice daily.   You have been scheduled for an endoscopy and colonoscopy. Please follow the written instructions given to you at your visit today.  If you use inhalers (even only as needed), please bring them with you on the day of your procedure.  DO NOT TAKE 7 DAYS PRIOR TO TEST- Trulicity  (dulaglutide ) Ozempic , Wegovy  (semaglutide ) Mounjaro  (tirzepatide ) Bydureon Bcise (exanatide extended release)  DO NOT TAKE 1 DAY PRIOR TO YOUR TEST Rybelsus  (semaglutide ) Adlyxin (lixisenatide) Victoza (liraglutide ) Byetta (exanatide) ___________________________________________________________________________

## 2024-05-21 NOTE — Progress Notes (Addendum)
 05/21/2024 Ann Peterson [id number] 09/05/1966   HISTORY OF PRESENT ILLNESS: This is a pleasant 58 year old female who is new to our office.  Her care has been accepted by Dr. Avram.  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  She has past medical history of thyroid  cancer, hypertension, hyperlipidemia, sleep apnea, multiple back surgeries.  She is here today with a lot of upper GI symptoms.  Says that 2 years ago she had a back surgery.  She was intubated and does not know if she had laryngeal spasm, but she ended up coughing up a lot of blood during that time and had a sore throat for couple of weeks following that.  Says that she feels like ever since then she has had issues with swallowing pills.  She says that about 3 weeks ago she started with pain down to her chest.  Says it feels like somebody has hit her in the center of her chest.  She says that about 8 or 9 days ago she started with just a severe sore throat.  She thought she had strep.  They tested her for strep and influenza, COVID, RSV, etc. and was all negative.  She says that she also feels like her tongue is burnt.  She says that it just feels like her esophagus tightens.  She has been on omeprazole 20 mg daily just for about the past 3 weeks or so.  Says it just has burning no matter what she eats.  Did start Mounjaro  about a year ago and has lost weight with that.  No dysphagia to food or liquids.  She also has a family history of colon cancer in her brother who died at age 51.  She says that her only colonoscopy was with Dr. Luis in 2021.  She says she had 1 adenomatous polyp removed.  They told her that she needed a repeat in 3 to 5 years and they actually contacted her recently telling her that she was due for that procedure.  We do not have those records at this time.  No rectal bleeding.   Past Medical History:  Diagnosis Date   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   COVID-19 12/03/2019    body aches, joint pain, fever 104 x 4 days, then symptoms resolved   History of kidney stones    Hyperlipidemia    Hypertension    PONV (postoperative nausea and vomiting)    n/v after appendectomy 5-6 yrs ago   Sleep apnea    uses cpap   Thyroid  disease    thyroidectomy due to cancer   Past Surgical History:  Procedure Laterality Date   ABDOMINAL HYSTERECTOMY  09/12/2016   partial   APPENDECTOMY  2011   BACK SURGERY     CESAREAN SECTION     x 3   CHOLECYSTECTOMY  2000   laproscopic   CYSTOSCOPY W/ URETERAL STENT PLACEMENT N/A 12/03/2019   Procedure: CYSTOSCOPY WITH STENT PLACEMENT, RETROGRADE;  Surgeon: Sherrilee Belvie CROME, MD;  Location: [facility];  Service: Urology;  Laterality: N/A;   CYSTOSCOPY WITH RETROGRADE PYELOGRAM, URETEROSCOPY AND STENT PLACEMENT Left 12/24/2019   Procedure: CYSTOSCOPY WITH RETROGRADE PYELOGRAM, URETEROSCOPY, STENT REMOVAL AND STONE EXTRACTION;  Surgeon: Sherrilee Belvie CROME, MD;  Location: [facility];  Service: Urology;  Laterality: Left;   CYSTOSCOPY WITH RETROGRADE PYELOGRAM, URETEROSCOPY AND STENT PLACEMENT Bilateral 05/04/2021   Procedure: CYSTOSCOPY WITH RETROGRADE PYELOGRAM, URETEROSCOPY AND STENT PLACEMENT;  Surgeon: Sherrilee Belvie CROME, MD;  Location: [facility];  Service: Urology;  Laterality: Bilateral;   discectomy     HOLMIUM LASER APPLICATION Bilateral 05/04/2021   Procedure: HOLMIUM LASER APPLICATION;  Surgeon: Sherrilee Belvie CROME, MD;  Location: [facility];  Service: Urology;  Laterality: Bilateral;   TOTAL THYROIDECTOMY  2000   sx and radiation done    reports that she has never smoked. She has never used smokeless tobacco. She reports that she does not drink alcohol and does not use drugs. family history includes Brain cancer in her brother; COPD in her father; Colon cancer in her brother; Dementia in her mother; Hypertension in her mother; Macular degeneration in her mother; Stroke in her mother. Allergies  Allergen Reactions    Pollen Extract       Outpatient Encounter Medications as of 05/21/2024  Medication Sig   albuterol  (VENTOLIN  HFA) 108 (90 Base) MCG/ACT inhaler Inhale 2 puffs into the lungs every 6 (six) hours as needed for wheezing or shortness of breath.   amLODipine  (NORVASC ) 10 MG tablet Take 1 tablet (10 mg total) by mouth daily.   BIOTIN PO Take 1 capsule by mouth every evening.   blood glucose meter kit and supplies Use up to four times daily as directed.   celecoxib  (CELEBREX ) 50 MG capsule Take 1 capsule (50 mg total) by mouth 2 (two) times daily as needed for pain.   Cholecalciferol (VITAMIN D ) 50 MCG (2000 UT) CAPS Take 1 capsule by mouth every evening.   co-enzyme Q-10 30 MG capsule Take 30 mg by mouth daily.   fluticasone  (FLONASE ) 50 MCG/ACT nasal spray Place 2 sprays into both nostrils daily.   glucose blood test strip Use as instructed to check blood sugar daily.   indapamide  (LOZOL ) 2.5 MG tablet TAKE 1 TABLET BY MOUTH EVERY DAY   ketoconazole  (NIZORAL ) 2 % shampoo Lather affected areas and leave on for 10 mins. Then rinse.  Use twice weekly until rash resolves.   Lancets (FREESTYLE) lancets Test blood glucose once daily as directed.   levothyroxine  (SYNTHROID ) 175 MCG tablet Take 1 tablet (175 mcg total) by mouth daily.   levothyroxine  (SYNTHROID ) 175 MCG tablet Take 1 tablet (175 mcg total) by mouth daily.   lidocaine  (XYLOCAINE ) 2 % solution Use as directed 15 mLs in the mouth or throat as needed.   magnesium oxide (MAG-OX) 400 MG tablet Take 400 mg by mouth daily.   metoprolol  succinate (TOPROL -XL) 100 MG 24 hr tablet Take 1 tablet (100 mg total) by mouth daily. With or immediately following a meal   ondansetron  (ZOFRAN -ODT) 4 MG disintegrating tablet Take 1 tablet (4 mg total) by mouth every 8 (eight) hours as needed for nausea or vomiting.   predniSONE  (STERAPRED UNI-PAK 21 TAB) 10 MG (21) TBPK tablet Day 1: 6 tablets (60 mg) Day 2: 5 tablets (50 mg) Day 3: 4 tablets (40 mg) Day 4: 3  tablets (30 mg) Day 5: 2 tablets (20 mg) Day 6: 1 tablet (10 mg)   rosuvastatin  (CRESTOR ) 20 MG tablet Take 1 tablet (20 mg total) by mouth daily.   tirzepatide  (MOUNJARO ) 12.5 MG/0.5ML Pen Inject 12.5 mg into the skin once a week.   traZODone  (DESYREL ) 50 MG tablet Take 0.5-1 tablets (25-50 mg total) by mouth at bedtime as needed for sleep (wean as tolerated).   No facility-administered encounter medications on file as of 05/21/2024.     REVIEW OF SYSTEMS  : All other systems reviewed and negative except where  noted in the History of Present Illness.   PHYSICAL EXAM: BP 126/74   Pulse 60   Ht 5' 2 (1.575 m)   Wt 178 lb 6 oz (80.9 kg)   LMP 09/07/2016 (Exact Date)   BMI 32.63 kg/m  General: Well developed white female in no acute distress Head: Normocephalic and atraumatic Eyes:  Sclerae anicteric, conjunctiva pink. Ears: Normal auditory acuity Lungs: Clear throughout to auscultation; no W/R/R. Heart: Regular rate and rhythm; no M//R/G. Abdomen: Soft, non-distended.  BS present.  Non-tender. Rectal:  Will be done at the time of colonoscopy. Musculoskeletal: Symmetrical with no gross deformities  Skin: No lesions on visible extremities Extremities: No edema  Neurological: Alert oriented x 4, grossly non-focal Psychological:  Alert and cooperative. Normal mood and affect  ASSESSMENT AND PLAN: *Atypical chest pain, GERD, sore throat, dysphagia: Suspect significant acid reflux possibly causing esophagitis or may be esophageal spasms.  Question stricture.  She is on Mounjaro  and it seems that symptoms have been present over the past year since starting that.  Is likely cause worsening of her symptoms. *Family history of colon cancer in her brother who died at age 46 *Personal history of adenomatous colon polyps.  Reports 1 adenomatous polyp in 2021 and was told to have a repeat in 3 to 5 years.  This is performed with Dr. Luis.  -EGD and colonoscopy with Dr. Avram.  The risks,  benefits, and alternatives to EGD with possible dilation and colonoscopy were discussed with the patient and she consents to proceed.  - She is on omeprazole 20 mg OTC daily.  I will have her increase that to twice daily for now. - I am also going to add Carafate  suspension.  Will prescribe tablet and give instructions on how to make it into a slurry.  Prescription sent to pharmacy.  CC:  [name], DO

## 2024-05-25 ENCOUNTER — Ambulatory Visit (AMBULATORY_SURGERY_CENTER)

## 2024-05-25 VITALS — Ht 63.0 in | Wt 173.0 lb

## 2024-05-25 DIAGNOSIS — K219 Gastro-esophageal reflux disease without esophagitis: Secondary | ICD-10-CM

## 2024-05-25 DIAGNOSIS — Z800 Family history of malignant neoplasm of digestive organs: Secondary | ICD-10-CM

## 2024-05-25 DIAGNOSIS — Z860100 Personal history of colon polyps, unspecified: Secondary | ICD-10-CM

## 2024-05-25 NOTE — Progress Notes (Signed)
 No egg or soy allergy known to patient  No issues known to pt with past sedation with any surgeries or procedures Patient denies ever being told they had issues or difficulty with intubation  No FH of Malignant Hyperthermia  Pt is on Mounjaro , Hold instructions provided  Pt is not on  home 02  Pt is not on blood thinners  Pt denies issues with constipation  No A fib or A flutter Have any cardiac testing pending--No Pt can ambulate  Pt denies use of chewing tobacco Discussed diabetic I weight loss medication holds Discussed NSAID holds Checked BMI Pt instructed to use [employer] or GoodRx for a price reduction on prep  Patient's chart reviewed

## 2024-05-27 ENCOUNTER — Other Ambulatory Visit: Payer: Self-pay

## 2024-05-27 ENCOUNTER — Other Ambulatory Visit (HOSPITAL_COMMUNITY): Payer: Self-pay

## 2024-06-12 ENCOUNTER — Emergency Department (HOSPITAL_BASED_OUTPATIENT_CLINIC_OR_DEPARTMENT_OTHER)
Admission: EM | Admit: 2024-06-12 | Discharge: 2024-06-12 | Disposition: A | Discharge status: Home / Self Care | Attending: Emergency Medicine | Admitting: Emergency Medicine

## 2024-06-12 ENCOUNTER — Other Ambulatory Visit: Payer: Self-pay

## 2024-06-12 ENCOUNTER — Encounter (HOSPITAL_BASED_OUTPATIENT_CLINIC_OR_DEPARTMENT_OTHER): Payer: Self-pay | Admitting: Emergency Medicine

## 2024-06-12 ENCOUNTER — Ambulatory Visit: Payer: Self-pay

## 2024-06-12 ENCOUNTER — Encounter (INDEPENDENT_AMBULATORY_CARE_PROVIDER_SITE_OTHER): Payer: Self-pay | Admitting: Family Medicine

## 2024-06-12 ENCOUNTER — Emergency Department (HOSPITAL_BASED_OUTPATIENT_CLINIC_OR_DEPARTMENT_OTHER)

## 2024-06-12 DIAGNOSIS — I10 Essential (primary) hypertension: Secondary | ICD-10-CM | POA: Insufficient documentation

## 2024-06-12 DIAGNOSIS — S6991XA Unspecified injury of right wrist, hand and finger(s), initial encounter: Secondary | ICD-10-CM | POA: Diagnosis present

## 2024-06-12 DIAGNOSIS — Z794 Long term (current) use of insulin: Secondary | ICD-10-CM | POA: Insufficient documentation

## 2024-06-12 DIAGNOSIS — B3731 Acute candidiasis of vulva and vagina: Secondary | ICD-10-CM | POA: Diagnosis not present

## 2024-06-12 DIAGNOSIS — E1169 Type 2 diabetes mellitus with other specified complication: Secondary | ICD-10-CM

## 2024-06-12 DIAGNOSIS — W01198A Fall on same level from slipping, tripping and stumbling with subsequent striking against other object, initial encounter: Secondary | ICD-10-CM | POA: Insufficient documentation

## 2024-06-12 DIAGNOSIS — S62326A Displaced fracture of shaft of fifth metacarpal bone, right hand, initial encounter for closed fracture: Secondary | ICD-10-CM | POA: Insufficient documentation

## 2024-06-12 DIAGNOSIS — Z79899 Other long term (current) drug therapy: Secondary | ICD-10-CM | POA: Diagnosis not present

## 2024-06-12 DIAGNOSIS — E119 Type 2 diabetes mellitus without complications: Secondary | ICD-10-CM | POA: Insufficient documentation

## 2024-06-12 DIAGNOSIS — M19041 Primary osteoarthritis, right hand: Secondary | ICD-10-CM | POA: Diagnosis not present

## 2024-06-12 DIAGNOSIS — S62306A Unspecified fracture of fifth metacarpal bone, right hand, initial encounter for closed fracture: Secondary | ICD-10-CM | POA: Diagnosis not present

## 2024-06-12 DIAGNOSIS — Y92009 Unspecified place in unspecified non-institutional (private) residence as the place of occurrence of the external cause: Secondary | ICD-10-CM | POA: Insufficient documentation

## 2024-06-12 DIAGNOSIS — S62316A Displaced fracture of base of fifth metacarpal bone, right hand, initial encounter for closed fracture: Secondary | ICD-10-CM | POA: Diagnosis not present

## 2024-06-12 MED ORDER — OXYCODONE-ACETAMINOPHEN 5-325 MG PO TABS: 1.0000 | ORAL_TABLET | Freq: Four times a day (QID) | ORAL | 0 refills | Status: DC | PRN

## 2024-06-12 MED ORDER — OXYCODONE-ACETAMINOPHEN 5-325 MG PO TABS
1.0000 | ORAL_TABLET | ORAL | Status: DC | PRN
  Administered 2024-06-12: 1 via ORAL
  Filled 2024-06-12: qty 1

## 2024-06-12 MED ORDER — OXYCODONE-ACETAMINOPHEN 5-325 MG PO TABS
1.0000 | ORAL_TABLET | Freq: Once | ORAL | Status: AC
  Administered 2024-06-12: 1 via ORAL
  Filled 2024-06-12: qty 1

## 2024-06-12 NOTE — Discharge Instructions (Signed)
 You were seen in the emergency department today for concerns of a hand injury.  You unfortunately sustained a fracture to your fifth metacarpal which is mildly displaced. These fractures will typically require surgical management by a hand surgeon so I have provided you with our hand surgeon's information who is on-call, Dr. Alyse. You are also welcome to follow up with any hand surgeon you prefer.  Take Tylenol  and ibuprofen for mild to moderate pain.  I have sent a prescription for Norco to your pharmacy for severe pain.  Only take this for severe pain.  For any concerns of new or worsening symptoms, return to the emergency department.

## 2024-06-12 NOTE — Telephone Encounter (Signed)
 noted

## 2024-06-12 NOTE — Telephone Encounter (Signed)
 FYI Only or Action Required?: FYI only for provider.  Patient was last seen in primary care on 05/14/2024 by Terry Wilhelmena Lloyd Hilario, FNP.  Called Nurse Triage reporting Pain.  Symptoms began today.  Interventions attempted: Ice/heat application.  Symptoms are: gradually worsening.  Triage Disposition: Go to ED Now (Notify PCP)  Patient/caregiver understands and will follow disposition?: Yes   Rn advised pt to go to ER or UC.    Copied from CRM [id number]. Topic: Clinical - Red Word Triage >> Jun 12, 2024  3:50 PM Harlene ORN wrote: Red Word that prompted transfer to Nurse Triage: fell a 45 ago. right Hand is swelling Reason for Disposition  Sounds like a serious injury to the triager  Answer Assessment - Initial Assessment Questions Clemens about 45 minutes ago   1. ONSET: When did the pain start?     45 minutes 2. LOCATION: Where is the pain located?     Right hand, ring finger  3. PAIN: How bad is the pain? (Scale 1-10; or mild, moderate, severe)     8 4. WORK OR EXERCISE: Has there been any recent work or exercise that involved this part (i.e., hand or wrist) of the body?     *No Answer* 5. CAUSE: What do you think is causing the pain?     *No Answer* 6. AGGRAVATING FACTORS: What makes the pain worse? (e.g., using computer)     *No Answer* 7. OTHER SYMPTOMS: Do you have any other symptoms? (e.g., fever, neck pain, numbness or tingling, rash, swelling)     *No Answer* 8. PREGNANCY: Is there any chance you are pregnant? When was your last menstrual period?     *No Answer*  Answer Assessment - Initial Assessment Questions 1. MECHANISM: How did the injury happen?    Dogs jumped on her and knocked her over. She landed on her hand outstretched 2. ONSET: When did the injury happen? (e.g., minutes, hours ago)      45 minutes ago 3. APPEARANCE of INJURY: What does the injury look like?      Swollen on the outside of the hand by ring and pinky finger 4.  SEVERITY: Can you use your hand normally? Can you bend your fingers into a ball and then fully open them?     8/10if she tries to do anything with it 5. SIZE: For cuts, bruises, or swelling, ask: How large is it? (e.g., inches or centimeters; entire hand)      Swelling, no bruising.  6. PAIN: How bad is the pain? (Scale 0-10; or none, mild, moderate, severe)     8/10 7. TETANUS: For any breaks in the skin, ask: When was your last tetanus booster?     No breaks 8. OTHER SYMPTOMS: Do you have any other symptoms?     Swelling and pain.  Protocols used: Hand Pain-A-AH, Hand Injury-A-AH

## 2024-06-12 NOTE — ED Triage Notes (Signed)
 C/o R hand/wrist pain after falling today. Bruising to area.

## 2024-06-12 NOTE — ED Provider Notes (Signed)
 [place] EMERGENCY DEPARTMENT AT [facility] Provider Note   CSN: [id number] Arrival date & time: 06/12/24  8170     Patient presents with: Hand Injury   Ann Peterson is a 58 y.o. female.  Patient with past history significant for hypertension, diabetes presents the emergency department concerns of a hand injury.  Reportedly fell while inside her home and struck the door frame after tripping over her dogs.  States that she has some swelling and bruising to the right hand along the pinky side.  Endorses pain and difficulty with making a fist.  Denies any tingling or numbness.  Patient is right-handed.   Hand Injury      Prior to Admission medications   Medication Sig Start Date End Date Taking? Authorizing Provider  oxyCODONE -acetaminophen  (PERCOCET/ROXICET) 5-325 MG tablet Take 1 tablet by mouth every 6 (six) hours as needed for severe pain (pain score 7-10). 06/12/24  Yes Yaeko Fazekas A, PA-C  albuterol  (VENTOLIN  HFA) 108 (90 Base) MCG/ACT inhaler Inhale 2 puffs into the lungs every 6 (six) hours as needed for wheezing or shortness of breath. 03/20/21   Joesph Annabella HERO, FNP  amLODipine  (NORVASC ) 10 MG tablet Take 1 tablet (10 mg total) by mouth daily. 03/26/24   Jolinda Potter M, DO  BIOTIN PO Take 1 capsule by mouth every evening.    [provider]  blood glucose meter kit and supplies Use up to four times daily as directed. 08/15/22   Jolinda Potter HERO, DO  celecoxib  (CELEBREX ) 50 MG capsule Take 1 capsule (50 mg total) by mouth 2 (two) times daily as needed for pain. 03/26/24   Jolinda Potter HERO, DO  Cholecalciferol (VITAMIN D ) 50 MCG (2000 UT) CAPS Take 1 capsule by mouth every evening.    [provider]  co-enzyme Q-10 30 MG capsule Take 30 mg by mouth daily.    [provider]  fluticasone  (FLONASE ) 50 MCG/ACT nasal spray Place 2 sprays into both nostrils daily. 04/21/24   Jolinda Potter M, DO  glucose blood test strip Use as  instructed to check blood sugar daily. 08/15/22   Jolinda Potter HERO, DO  indapamide  (LOZOL ) 2.5 MG tablet TAKE 1 TABLET BY MOUTH EVERY DAY 10/15/23   McKenzie, Belvie CROME, MD  ketoconazole  (NIZORAL ) 2 % shampoo Lather affected areas and leave on for 10 mins. Then rinse.  Use twice weekly until rash resolves. Patient not taking: Reported on 05/25/2024 03/26/24   Jolinda Potter HERO, DO  Lancets (FREESTYLE) lancets Test blood glucose once daily as directed. 08/15/22   Jolinda Potter HERO, DO  levothyroxine  (SYNTHROID ) 175 MCG tablet Take 1 tablet (175 mcg total) by mouth daily. 04/21/24   Jolinda Potter HERO, DO  levothyroxine  (SYNTHROID ) 175 MCG tablet Take 1 tablet (175 mcg total) by mouth daily. 03/26/24   Jolinda Potter HERO, DO  lidocaine  (XYLOCAINE ) 2 % solution Use as directed 15 mLs in the mouth or throat as needed. 05/14/24   Del Orbe Polanco, Iliana, FNP  magnesium oxide (MAG-OX) 400 MG tablet Take 400 mg by mouth daily.    [provider]  metoprolol  succinate (TOPROL -XL) 100 MG 24 hr tablet Take 1 tablet (100 mg total) by mouth daily. With or immediately following a meal 03/26/24   Jolinda Potter M, DO  ondansetron  (ZOFRAN -ODT) 4 MG disintegrating tablet Take 1 tablet (4 mg total) by mouth every 8 (eight) hours as needed for nausea or vomiting. 03/26/24   Jolinda Potter M, DO  predniSONE  (STERAPRED UNI-PAK 21  TAB) 10 MG (21) TBPK tablet Day 1: 6 tablets (60 mg) Day 2: 5 tablets (50 mg) Day 3: 4 tablets (40 mg) Day 4: 3 tablets (30 mg) Day 5: 2 tablets (20 mg) Day 6: 1 tablet (10 mg) 05/14/24   Del Orbe Polanco, Iliana, FNP  rosuvastatin  (CRESTOR ) 20 MG tablet Take 1 tablet (20 mg total) by mouth daily. 03/30/24   [name], DO  sucralfate  (CARAFATE ) 1 g tablet Take 1 tablet (1 g total) by mouth 4 (four) times daily -  with meals and at bedtime. 05/21/24   Zehr, Jessica D, PA-C  tirzepatide  (MOUNJARO ) 12.5 MG/0.5ML Pen Inject 12.5 mg into the skin once a week. 03/26/24   [name], DO  traZODone  (DESYREL ) 50 MG tablet Take 0.5-1 tablets (25-50 mg total) by mouth at bedtime as needed for sleep (wean as tolerated). 03/26/24   [name], DO    Allergies: Pollen extract    Review of Systems  Musculoskeletal:        Hand pain  All other systems reviewed and are negative.   Updated Vital Signs BP (!) 145/80   Pulse 76   Temp (!) 97.5 F (36.4 C)   Resp 16   LMP 09/07/2016 (Exact Date)   SpO2 96%   Physical Exam Vitals and nursing note reviewed.  Musculoskeletal:        General: Swelling, tenderness and deformity present. Normal range of motion.       Arms:     Comments: TTP along the pinky side of the right hand. Difficulty making fist due to pain. Neurovascularly and neuromuscularly intact.     (all labs ordered are listed, but only abnormal results are displayed) Labs Reviewed - No data to display  EKG: None  Radiology: DG Hand Complete Right Result Date: 06/12/2024 CLINICAL DATA:  Pain after fall today.  Bruising and swelling. EXAM: RIGHT HAND - COMPLETE 3+ VIEW COMPARISON:  None Available. FINDINGS: Mildly displaced fifth metacarpal fracture. No intra-articular involvement. No additional fracture. Mild osteoarthritis of the triscaphe articulation. Undulation of the fifth digit distal interphalangeal joint. Soft tissue edema seen at the fracture site. IMPRESSION: Mildly displaced fifth metacarpal fracture. Electronically Signed   By: Andrea Gasman M.D.   On: 06/12/2024 19:58     Procedures   Medications Ordered in the ED  oxyCODONE -acetaminophen  (PERCOCET/ROXICET) 5-325 MG per tablet 1 tablet (1 tablet Oral Given 06/12/24 1943)  oxyCODONE -acetaminophen  (PERCOCET/ROXICET) 5-325 MG per tablet 1 tablet (1 tablet Oral Given 06/12/24 2305)                                    Medical Decision Making Amount and/or Complexity of Data Reviewed Radiology: ordered.  Risk Prescription drug management.   This patient presents to  the ED for concern of hand injury.  Differential diagnosis includes metacarpal fracture, intra-articular fracture, dislocation, sprain   Imaging Studies ordered:  I ordered imaging studies including x-ray of the right hand I independently visualized and interpreted imaging which showed mildly displaced fifth metacarpal fracture. I agree with the radiologist interpretation   Medicines ordered and prescription drug management:  I ordered medication including Percocet for pain Reevaluation of the patient after these medicines showed that the patient improved I have reviewed the patients home medicines and have made adjustments as needed   Problem List / ED Course:  Patient without significant medical history beyond hypertension and  diabetes presents to the emergency department concerns of a hand injury.  Reportedly fell inside her home and struck a door frame after tripping over her dogs.  Dors is pain to the medial aspect of the right hand.  Endorses difficulty with flexion extension of the finger in this area due to pain.  Has noted some swelling and bruising in this area.  She is right-handed. Exam reveals primary tenderness towards the medial aspect along the fifth digit of the palm of the hand.  Some swelling and bruising seen in this area.  Difficulty flexing the fingers to make a fist due to pain.  No obvious angulation seen on exam. X-ray of the right hand shows a mildly displaced fifth metacarpal fracture.  Will place patient into an ulnar gutter splint.  Advised patient that this type of fracture would likely require hand surgery management and follow-up.  Provided patient with contact permission for hand surgeon on-call, Dr. Alyse.  Prescription for Percocet sent to pharmacy for pain control.  Return precautions discussed such as concerns for worsening symptoms.  Discharged home in stable condition.   Social Determinants of Health:  None  Final diagnoses:  Closed displaced  fracture of shaft of fifth metacarpal bone of right hand, initial encounter    ED Discharge Orders          Ordered    oxyCODONE -acetaminophen  (PERCOCET/ROXICET) 5-325 MG tablet  Every 6 hours PRN        06/12/24 2246               Ethleen Lormand A, PA-C 06/12/24 2356    Pamella Ozell LABOR, DO 06/18/24 1729

## 2024-06-15 ENCOUNTER — Other Ambulatory Visit (HOSPITAL_COMMUNITY): Payer: Self-pay

## 2024-06-15 ENCOUNTER — Encounter: Payer: Self-pay | Admitting: Family Medicine

## 2024-06-15 DIAGNOSIS — S62306A Unspecified fracture of fifth metacarpal bone, right hand, initial encounter for closed fracture: Secondary | ICD-10-CM | POA: Diagnosis not present

## 2024-06-15 MED ORDER — FLUCONAZOLE 150 MG PO TABS
150.0000 mg | ORAL_TABLET | Freq: Once | ORAL | 0 refills | Status: AC
  Filled 2024-06-15: qty 2, 3d supply, fill #0

## 2024-06-15 NOTE — Telephone Encounter (Signed)

## 2024-06-26 ENCOUNTER — Other Ambulatory Visit (HOSPITAL_COMMUNITY): Payer: Self-pay

## 2024-07-01 ENCOUNTER — Encounter: Admitting: Internal Medicine

## 2024-07-01 DIAGNOSIS — M1812 Unilateral primary osteoarthritis of first carpometacarpal joint, left hand: Secondary | ICD-10-CM | POA: Diagnosis not present

## 2024-07-01 DIAGNOSIS — S62306D Unspecified fracture of fifth metacarpal bone, right hand, subsequent encounter for fracture with routine healing: Secondary | ICD-10-CM | POA: Diagnosis not present

## 2024-07-01 DIAGNOSIS — M25641 Stiffness of right hand, not elsewhere classified: Secondary | ICD-10-CM | POA: Diagnosis not present

## 2024-07-15 ENCOUNTER — Other Ambulatory Visit (HOSPITAL_COMMUNITY): Payer: Self-pay

## 2024-07-15 ENCOUNTER — Other Ambulatory Visit: Payer: Self-pay

## 2024-07-16 ENCOUNTER — Other Ambulatory Visit (HOSPITAL_COMMUNITY): Payer: Self-pay

## 2024-07-23 DIAGNOSIS — M1812 Unilateral primary osteoarthritis of first carpometacarpal joint, left hand: Secondary | ICD-10-CM | POA: Diagnosis not present

## 2024-07-23 DIAGNOSIS — M79641 Pain in right hand: Secondary | ICD-10-CM | POA: Diagnosis not present

## 2024-07-23 DIAGNOSIS — M25641 Stiffness of right hand, not elsewhere classified: Secondary | ICD-10-CM | POA: Diagnosis not present

## 2024-07-23 DIAGNOSIS — S62306A Unspecified fracture of fifth metacarpal bone, right hand, initial encounter for closed fracture: Secondary | ICD-10-CM | POA: Diagnosis not present

## 2024-07-27 ENCOUNTER — Encounter: Payer: Self-pay | Admitting: Internal Medicine

## 2024-07-28 NOTE — Progress Notes (Unsigned)
 [facility] History and Physical   Primary Care Physician:  [name], DO   Reason for Procedure:    Encounter Diagnoses  Name Primary?   Atypical chest pain Yes   Hx of colonic polyps    Family history of colon cancer      Plan:    EGD, colonoscopy   The patient was provided an opportunity to ask questions and all were answered. The patient agreed with the plan.   HPI: Ann Peterson is a 58 y.o. 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  This has been an issue ever since she was intubated for a surgery a few years ago.  She also has a history of an adenomatous colon polyp removal by Dr. Luis in the past (2021) and a family history of colon cancer in her brother.  He was diagnosed at age 65.   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   COVID-19 12/03/2019   body aches, joint pain, fever 104 x 4 days, then symptoms resolved   Diabetes mellitus without complication (HCC)    GERD (gastroesophageal reflux disease)    History of kidney stones    Hyperlipidemia    Hypertension    PONV (postoperative nausea and vomiting)    n/v after appendectomy 5-6 yrs ago   Sleep apnea    uses cpap   Thyroid  disease    thyroidectomy due to cancer    Past Surgical History:  Procedure Laterality Date   ABDOMINAL HYSTERECTOMY  09/12/2016   partial   APPENDECTOMY  2011   BACK SURGERY     CESAREAN SECTION     x 3   CHOLECYSTECTOMY  2000   laproscopic   CYSTOSCOPY W/ URETERAL STENT PLACEMENT N/A 12/03/2019   Procedure: CYSTOSCOPY WITH STENT PLACEMENT, RETROGRADE;  Surgeon: Sherrilee Belvie CROME, MD;  Location: [facility];  Service: Urology;  Laterality: N/A;   CYSTOSCOPY WITH RETROGRADE PYELOGRAM, URETEROSCOPY AND STENT PLACEMENT Left 12/24/2019   Procedure: CYSTOSCOPY WITH RETROGRADE PYELOGRAM, URETEROSCOPY, STENT REMOVAL AND STONE  EXTRACTION;  Surgeon: Sherrilee Belvie CROME, MD;  Location: [facility];  Service: Urology;  Laterality: Left;   CYSTOSCOPY WITH RETROGRADE PYELOGRAM, URETEROSCOPY AND STENT PLACEMENT Bilateral 05/04/2021   Procedure: CYSTOSCOPY WITH RETROGRADE PYELOGRAM, URETEROSCOPY AND STENT PLACEMENT;  Surgeon: Sherrilee Belvie CROME, MD;  Location: [facility];  Service: Urology;  Laterality: Bilateral;   discectomy     X2   HOLMIUM LASER APPLICATION Bilateral 05/04/2021   Procedure: HOLMIUM LASER APPLICATION;  Surgeon: Sherrilee Belvie CROME, MD;  Location: [facility];  Service: Urology;  Laterality: Bilateral;   TOTAL THYROIDECTOMY  2000   sx and radiation done     Current Outpatient Medications  Medication Sig Dispense Refill   albuterol  (VENTOLIN  HFA) 108 (90 Base) MCG/ACT inhaler Inhale 2 puffs into the lungs every 6 (six) hours as needed for wheezing or shortness of breath. 8 g 2   amLODipine  (NORVASC ) 10 MG tablet Take 1 tablet (10 mg total) by mouth daily. 90 tablet 4   BIOTIN PO Take 1 capsule by mouth every evening.     blood glucose meter kit and supplies Use up to four times daily as directed. 1 each 0   celecoxib  (CELEBREX ) 50 MG capsule Take 1 capsule (50 mg total) by mouth 2 (two) times daily as needed for pain. 180 capsule  PRN   Cholecalciferol (VITAMIN D ) 50 MCG (2000 UT) CAPS Take 1 capsule by mouth every evening.     co-enzyme Q-10 30 MG capsule Take 30 mg by mouth daily.     fluticasone  (FLONASE ) 50 MCG/ACT nasal spray Place 2 sprays into both nostrils daily. 48 g 1   glucose blood test strip Use as instructed to check blood sugar daily. 100 each PRN   indapamide  (LOZOL ) 2.5 MG tablet TAKE 1 TABLET BY MOUTH EVERY DAY 90 tablet 3   ketoconazole  (NIZORAL ) 2 % shampoo Lather affected areas and leave on for 10 mins. Then rinse.  Use twice weekly until rash resolves. (Patient not taking: Reported on 05/25/2024) 120 mL 2   Lancets (FREESTYLE) lancets Test blood glucose once daily as directed.  100 each PRN   levothyroxine  (SYNTHROID ) 175 MCG tablet Take 1 tablet (175 mcg total) by mouth daily. 90 tablet 3   levothyroxine  (SYNTHROID ) 175 MCG tablet Take 1 tablet (175 mcg total) by mouth daily. 90 tablet 4   lidocaine  (XYLOCAINE ) 2 % solution Use as directed 15 mLs in the mouth or throat as needed. 100 mL 0   magnesium oxide (MAG-OX) 400 MG tablet Take 400 mg by mouth daily.     metoprolol  succinate (TOPROL -XL) 100 MG 24 hr tablet Take 1 tablet (100 mg total) by mouth daily. With or immediately following a meal 90 tablet 4   ondansetron  (ZOFRAN -ODT) 4 MG disintegrating tablet Take 1 tablet (4 mg total) by mouth every 8 (eight) hours as needed for nausea or vomiting. 20 tablet 1   oxyCODONE -acetaminophen  (PERCOCET/ROXICET) 5-325 MG tablet Take 1 tablet by mouth every 6 (six) hours as needed for severe pain (pain score 7-10). 15 tablet 0   predniSONE  (STERAPRED UNI-PAK 21 TAB) 10 MG (21) TBPK tablet Day 1: 6 tablets (60 mg) Day 2: 5 tablets (50 mg) Day 3: 4 tablets (40 mg) Day 4: 3 tablets (30 mg) Day 5: 2 tablets (20 mg) Day 6: 1 tablet (10 mg) 21 tablet 0   rosuvastatin  (CRESTOR ) 20 MG tablet Take 1 tablet (20 mg total) by mouth daily. 90 tablet 3   sucralfate  (CARAFATE ) 1 g tablet Take 1 tablet (1 g total) by mouth 4 (four) times daily -  with meals and at bedtime. 120 tablet 0   tirzepatide  (MOUNJARO ) 12.5 MG/0.5ML Pen Inject 12.5 mg into the skin once a week. 6 mL 4   traZODone  (DESYREL ) 50 MG tablet Take 0.5-1 tablets (25-50 mg total) by mouth at bedtime as needed for sleep (wean as tolerated). 90 tablet 0   No current facility-administered medications for this visit.    Allergies as of 07/29/2024 - Review Complete 06/15/2024  Allergen Reaction Noted   Pollen extract Other (See Comments)     Family History  Problem Relation Age of Onset   Hypertension Mother    Dementia Mother    Macular degeneration Mother    Stroke Mother    COPD Father        lung cancer   Brain cancer  Brother    Colon cancer Brother    Rectal cancer Neg Hx    Stomach cancer Neg Hx    Esophageal cancer Neg Hx     Social History   Socioeconomic History   Marital status: Widowed    Spouse name: Not on file   Number of children: Not on file   Years of education: Not on file   Highest education level: Associate degree: academic program  Occupational History   Not on file  Tobacco Use   Smoking status: Never   Smokeless tobacco: Never  Vaping Use   Vaping status: Never Used  Substance and Sexual Activity   Alcohol use: No   Drug use: No   Sexual activity: Not Currently    Birth control/protection: Surgical  Other Topics Concern   Not on file  Social History Narrative   Not on file   Social Drivers of Health   Financial Resource Strain: Low Risk  (03/26/2024)   Overall Financial Resource Strain (CARDIA)    Difficulty of Paying Living Expenses: Not hard at all  Food Insecurity: No Food Insecurity (03/26/2024)   Hunger Vital Sign    Worried About Running Out of Food in the Last Year: Never true    Ran Out of Food in the Last Year: Never true  Transportation Needs: No Transportation Needs (03/26/2024)   PRAPARE - Administrator, Civil Service (Medical): No    Lack of Transportation (Non-Medical): No  Physical Activity: Insufficiently Active (03/26/2024)   Exercise Vital Sign    Days of Exercise per Week: 1 day    Minutes of Exercise per Session: 10 min  Stress: No Stress Concern Present (03/26/2024)   [employer] of Occupational Health - Occupational Stress Questionnaire    Feeling of Stress: Only a little  Social Connections: Moderately Integrated (03/26/2024)   Social Connection and Isolation Panel    Frequency of Communication with Friends and Family: More than three times a week    Frequency of Social Gatherings with Friends and Family: More than three times a week    Attends Religious Services: More than 4 times per year    Active Member of [employer] or  Organizations: Yes    Attends Banker Meetings: More than 4 times per year    Marital Status: Widowed  Intimate Partner Violence: Not At Risk (09/17/2023)   Humiliation, Afraid, Rape, and Kick questionnaire    Fear of Current or Ex-Partner: No    Emotionally Abused: No    Physically Abused: No    Sexually Abused: No    Review of Systems: Positive for *** All other review of systems negative except as mentioned in the HPI.  Physical Exam: Vital signs LMP 09/07/2016 (Exact Date)   General:   Alert,  Well-developed, well-nourished, pleasant and cooperative in NAD Lungs:  Clear throughout to auscultation.   Heart:  Regular rate and rhythm; no murmurs, clicks, rubs,  or gallops. Abdomen:  Soft, nontender and nondistended. Normal bowel sounds.   Neuro/Psych:  Alert and cooperative. Normal mood and affect. A and O x 3   @Elester Apodaca  CHARLENA Commander, MD, [facility] [phone] (pager) 07/28/2024 8:37 PM@

## 2024-07-29 ENCOUNTER — Ambulatory Visit (AMBULATORY_SURGERY_CENTER): Admitting: Internal Medicine

## 2024-07-29 ENCOUNTER — Encounter: Payer: Self-pay | Admitting: Internal Medicine

## 2024-07-29 VITALS — BP 162/87 | HR 73 | Temp 97.5°F | Resp 17 | Ht 63.0 in | Wt 173.0 lb

## 2024-07-29 DIAGNOSIS — D124 Benign neoplasm of descending colon: Secondary | ICD-10-CM

## 2024-07-29 DIAGNOSIS — R1310 Dysphagia, unspecified: Secondary | ICD-10-CM

## 2024-07-29 DIAGNOSIS — K635 Polyp of colon: Secondary | ICD-10-CM | POA: Diagnosis not present

## 2024-07-29 DIAGNOSIS — Z860100 Personal history of colon polyps, unspecified: Secondary | ICD-10-CM | POA: Diagnosis not present

## 2024-07-29 DIAGNOSIS — I10 Essential (primary) hypertension: Secondary | ICD-10-CM | POA: Diagnosis not present

## 2024-07-29 DIAGNOSIS — Z860101 Personal history of adenomatous and serrated colon polyps: Secondary | ICD-10-CM

## 2024-07-29 DIAGNOSIS — Z1211 Encounter for screening for malignant neoplasm of colon: Secondary | ICD-10-CM

## 2024-07-29 DIAGNOSIS — K219 Gastro-esophageal reflux disease without esophagitis: Secondary | ICD-10-CM

## 2024-07-29 DIAGNOSIS — R1319 Other dysphagia: Secondary | ICD-10-CM

## 2024-07-29 DIAGNOSIS — Z800 Family history of malignant neoplasm of digestive organs: Secondary | ICD-10-CM | POA: Diagnosis not present

## 2024-07-29 DIAGNOSIS — K2950 Unspecified chronic gastritis without bleeding: Secondary | ICD-10-CM

## 2024-07-29 DIAGNOSIS — K2970 Gastritis, unspecified, without bleeding: Secondary | ICD-10-CM

## 2024-07-29 DIAGNOSIS — K5730 Diverticulosis of large intestine without perforation or abscess without bleeding: Secondary | ICD-10-CM

## 2024-07-29 DIAGNOSIS — K209 Esophagitis, unspecified without bleeding: Secondary | ICD-10-CM | POA: Diagnosis not present

## 2024-07-29 DIAGNOSIS — G4730 Sleep apnea, unspecified: Secondary | ICD-10-CM | POA: Diagnosis not present

## 2024-07-29 DIAGNOSIS — R0789 Other chest pain: Secondary | ICD-10-CM

## 2024-07-29 DIAGNOSIS — K2990 Gastroduodenitis, unspecified, without bleeding: Secondary | ICD-10-CM | POA: Diagnosis not present

## 2024-07-29 DIAGNOSIS — E785 Hyperlipidemia, unspecified: Secondary | ICD-10-CM | POA: Diagnosis not present

## 2024-07-29 MED ORDER — DEXTROSE 5 % IV SOLN: INTRAVENOUS | Status: AC

## 2024-07-29 MED ORDER — SODIUM CHLORIDE 0.9 % IV SOLN: 500.0000 mL | INTRAVENOUS | Status: DC

## 2024-07-29 NOTE — Progress Notes (Signed)
 Report given to PACU, vss

## 2024-07-29 NOTE — Op Note (Signed)
 [facility] Patient Name: Ann Peterson Procedure Date: 07/29/2024 8:43 AM MRN: [id number] Endoscopist: [name] , MD, [id number] Age: 58 Referring MD:  Date of Birth: 1965-10-24 Gender: Female Account #: [id number] Procedure:                Colonoscopy Indications:              Surveillance: Personal history of adenomatous                            polyps on last colonoscopy 4 years ago Medicines:                Monitored Anesthesia Care Procedure:                Pre-Anesthesia Assessment:                           - Prior to the procedure, a History and Physical                            was performed, and patient medications and                            allergies were reviewed. [name] tolerance of                            previous anesthesia was also reviewed. The risks                            and benefits of the procedure and the sedation                            options and risks were discussed with the patient.                            All questions were answered, and informed consent                            was obtained. Prior Anticoagulants: The patient has                            taken no anticoagulant or antiplatelet agents. ASA                            Grade Assessment: II - A patient with mild systemic                            disease. After reviewing the risks and benefits,                            the patient was deemed in satisfactory condition to                            undergo the procedure.  After obtaining informed consent, the colonoscope                            was passed under direct vision. Throughout the                            procedure, the patient's blood pressure, pulse, and                            oxygen saturations were monitored continuously. The                            CF [id number] #[id number] was introduced through the anus                            and advanced to the  the cecum, identified by                            appendiceal orifice and ileocecal valve. The                            colonoscopy was performed without difficulty. The                            patient tolerated the procedure well. The quality                            of the bowel preparation was good. The ileocecal                            valve, appendiceal orifice, and rectum were                            photographed. Scope In: 9:00:00 AM Scope Out: 9:20:03 AM Scope Withdrawal Time: 0 hours 16 minutes 33 seconds  Total Procedure Duration: 0 hours 20 minutes 3 seconds  Findings:                 The perianal and digital rectal examinations were                            normal.                           A 4 mm polyp was found in the proximal descending                            colon. The polyp was sessile. The polyp was removed                            with a cold snare. Resection and retrieval were                            complete. Verification of patient identification  for the specimen was done. Estimated blood loss was                            minimal.                           Multiple small-mouthed diverticula were found in                            the sigmoid colon.                           The exam was otherwise without abnormality on                            direct and retroflexion views. Complications:            No immediate complications. Estimated Blood Loss:     Estimated blood loss was minimal. Impression:               - One 4 mm polyp in the proximal descending colon,                            removed with a cold snare. Resected and retrieved.                           - Diverticulosis in the sigmoid colon.                           - The examination was otherwise normal on direct                            and retroflexion views.                           - Personal history of colonic polyp - diminutive                             adenoma 2021, also has FHx CRCA brother died at 42,                            all siblings have polyps. Recommendation:           - Patient has a contact number available for                            emergencies. The signs and symptoms of potential                            delayed complications were discussed with the                            patient. Return to normal activities tomorrow.                            Written discharge instructions were provided  to the                            patient.                           - Resume previous diet.                           - Continue present medications.                           - Await pathology results.                           - Repeat colonoscopy is recommended for                            surveillance. The colonoscopy date will be                            determined after pathology results from today's                            exam become available for review. [name], MD 07/29/2024 9:39:30 AM This report has been signed electronically.

## 2024-07-29 NOTE — Patient Instructions (Addendum)
 It looks like you have gastritis so I biopsied that to understand it better to see if there is H. pylori.  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  It is a change in the lining of the esophagus and the proximal esophagus, it is congenital and usually not a problem but I typically take a biopsy of it to confirm.  There was one 4 mm colon polyp, and I saw some diverticulosis.  All else okay on the colonoscopy with a good prep.  I will contact you about pathology results and any additional recommendations.  I anticipate recommending a repeat colonoscopy in 5 years most likely.  I appreciate the opportunity to care for you. Lupita CHARLENA Commander, MD, FACG  YOU HAD AN ENDOSCOPIC PROCEDURE TODAY AT THE [facility]:   Refer to the procedure report that was given to you for any specific questions about what was found during the examination.  If the procedure report does not answer your questions, please call your gastroenterologist to clarify.  If you requested that your care partner not be given the details of your procedure findings, then the procedure report has been included in a sealed envelope for you to review at your convenience later.  YOU SHOULD EXPECT: Some feelings of bloating in the abdomen. Passage of more gas than usual.  Walking can help get rid of the air that was put into your GI tract during the procedure and reduce the bloating. If you had a lower endoscopy (such as a colonoscopy or flexible sigmoidoscopy) you may notice spotting of blood in your stool or on the toilet paper. If you underwent a bowel prep for your procedure, you may not have a normal bowel movement for a few days.  Please Note:  You might notice some irritation and congestion in your nose or some drainage.  This is from the oxygen used during your procedure.  There is no need for concern and it should clear up in a day or so.  SYMPTOMS TO REPORT IMMEDIATELY:  Following  lower endoscopy (colonoscopy or flexible sigmoidoscopy):  Excessive amounts of blood in the stool  Significant tenderness or worsening of abdominal pains  Swelling of the abdomen that is new, acute  Fever of 100F or higher  Following upper endoscopy (EGD)  Vomiting of blood or coffee ground material  New chest pain or pain under the shoulder blades  Painful or persistently difficult swallowing  New shortness of breath  Fever of 100F or higher  Black, tarry-looking stools  For urgent or emergent issues, a gastroenterologist can be reached at any hour by calling [phone]. Do not use MyChart messaging for urgent concerns.    DIET:  We do recommend a small meal at first, but then you may proceed to your regular diet.  Drink plenty of fluids but you should avoid alcoholic beverages for 24 hours.  ACTIVITY:  You should plan to take it easy for the rest of today and you should NOT DRIVE or use heavy machinery until tomorrow (because of the sedation medicines used during the test).    FOLLOW UP: Our staff will call the number listed on your records the next business day following your procedure.  We will call around 7:15- 8:00 am to check on you and address any questions or concerns that you may have regarding the information given to you following your procedure. If we do not reach you, we will leave a  message.     If any biopsies were taken you will be contacted by phone or by letter within the next 1-3 weeks.  Please call us  at [phone] if you have not heard about the biopsies in 3 weeks.    SIGNATURES/CONFIDENTIALITY: You and/or your care partner have signed paperwork which will be entered into your electronic medical record.  These signatures attest to the fact that that the information above on your After Visit Summary has been reviewed and is understood.  Full responsibility of the confidentiality of this discharge information lies with you and/or your care-partner.

## 2024-07-29 NOTE — Progress Notes (Signed)
0844 Robinul 0.1 mg IV given due large amount of secretions upon assessment.  MD made aware, vss  ?

## 2024-07-29 NOTE — Op Note (Signed)
 [facility] Patient Name: Ann Peterson Procedure Date: 07/29/2024 8:44 AM MRN: [id number] Endoscopist: [name] , MD, [id number] Age: 58 Referring MD:  Date of Birth: 1966-04-10 Gender: Female Account #: [id number] Procedure:                Upper GI endoscopy Indications:              Dysphagia to pills, Suspected gastro-esophageal                            reflux disease, atypical chest pain Medicines:                Monitored Anesthesia Care Procedure:                Pre-Anesthesia Assessment:                           - Prior to the procedure, a History and Physical                            was performed, and patient medications and                            allergies were reviewed. [name] tolerance of                            previous anesthesia was also reviewed. The risks                            and benefits of the procedure and the sedation                            options and risks were discussed with the patient.                            All questions were answered, and informed consent                            was obtained. Prior Anticoagulants: The patient has                            taken no anticoagulant or antiplatelet agents. ASA                            Grade Assessment: II - A patient with mild systemic                            disease. After reviewing the risks and benefits,                            the patient was deemed in satisfactory condition to                            undergo the procedure.  After obtaining informed consent, the endoscope was                            passed under direct vision. Throughout the                            procedure, the patient's blood pressure, pulse, and                            oxygen saturations were monitored continuously. The                            GIF [id number] #[id number] was introduced through the                            mouth, and advanced to  the second part of duodenum.                            The upper GI endoscopy was accomplished without                            difficulty. The patient tolerated the procedure                            well. Scope In: Scope Out: Findings:                 One tongue of salmon-colored mucosa was present. No                            other visible abnormalities were present in the                            proximal esophagus - consistent with an inlet                            patch. Biopsies were taken with a cold forceps for                            histology. Verification of patient identification                            for the specimen was done. Estimated blood loss was                            minimal.                           Patchy inflammation characterized by congestion                            (edema) and erythema was found in the entire                            examined stomach. Biopsies were taken  with a cold                            forceps for histology. Verification of patient                            identification for the specimen was done. Estimated                            blood loss was minimal.                           The cardia and gastric fundus were normal on                            retroflexion.                           The examined duodenum was normal.                           The cardia and gastric fundus were otherwie normal                            on retroflexion. Complications:            s                           No immediate complications. Estimated Blood Loss:     Estimated blood loss was minimal. Impression:               - Salmon-colored mucosa. Biopsied. Looks like an                            inlet patch                           - Gastritis. Biopsied.                           - Normal examined duodenum.                           - No cause of dysphgia seen Recommendation:           - Patient has a contact  number available for                            emergencies. The signs and symptoms of potential                            delayed complications were discussed with the                            patient. Return to normal activities tomorrow.  Written discharge instructions were provided to the                            patient.                           - Resume previous diet.                           - Continue present medications.                           - Await pathology results. [name], MD 07/29/2024 9:35:57 AM This report has been signed electronically.

## 2024-07-29 NOTE — Progress Notes (Signed)
 Called to room to assist during endoscopic procedure.  Patient ID and intended procedure confirmed with present staff. Received instructions for my participation in the procedure from the performing physician.

## 2024-07-29 NOTE — Progress Notes (Signed)
 Pt's states no medical or surgical changes since previsit or office visit.

## 2024-07-30 ENCOUNTER — Telehealth: Payer: Self-pay | Admitting: *Deleted

## 2024-07-30 NOTE — Telephone Encounter (Signed)
  Follow up Call-     07/29/2024    7:52 AM  Call back number  Post procedure Call Back phone  # [phone]  Permission to leave phone message Yes     Patient questions:  Do you have a fever, pain , or abdominal swelling? No. Pain Score  0 *  Have you tolerated food without any problems? Yes.    Have you been able to return to your normal activities? Yes.    Do you have any questions about your discharge instructions: Diet   No. Medications  No. Follow up visit  No.  Do you have questions or concerns about your Care? No.  Actions: * If pain score is 4 or above: No action needed, pain <4.

## 2024-07-30 NOTE — Telephone Encounter (Signed)
 Left message for post procedure follow up call.

## 2024-07-31 LAB — SURGICAL PATHOLOGY

## 2024-08-03 ENCOUNTER — Ambulatory Visit: Payer: Self-pay | Admitting: Internal Medicine

## 2024-08-13 DIAGNOSIS — M1812 Unilateral primary osteoarthritis of first carpometacarpal joint, left hand: Secondary | ICD-10-CM | POA: Diagnosis not present

## 2024-08-13 DIAGNOSIS — S62306D Unspecified fracture of fifth metacarpal bone, right hand, subsequent encounter for fracture with routine healing: Secondary | ICD-10-CM | POA: Diagnosis not present

## 2024-08-31 ENCOUNTER — Other Ambulatory Visit: Payer: Self-pay

## 2024-09-24 ENCOUNTER — Other Ambulatory Visit: Payer: Self-pay | Admitting: Urology

## 2024-09-25 ENCOUNTER — Other Ambulatory Visit: Payer: Self-pay

## 2024-09-25 ENCOUNTER — Other Ambulatory Visit (HOSPITAL_COMMUNITY): Payer: Self-pay

## 2024-09-25 MED ORDER — INDAPAMIDE 2.5 MG PO TABS
2.5000 mg | ORAL_TABLET | Freq: Every day | ORAL | 3 refills | Status: AC
  Filled 2024-09-25: qty 90, 90d supply, fill #0

## 2024-09-27 ENCOUNTER — Other Ambulatory Visit (HOSPITAL_COMMUNITY): Payer: Self-pay

## 2024-09-28 ENCOUNTER — Ambulatory Visit: Payer: Self-pay | Admitting: Family Medicine

## 2024-09-28 ENCOUNTER — Other Ambulatory Visit (HOSPITAL_COMMUNITY): Payer: Self-pay

## 2024-09-28 ENCOUNTER — Encounter: Payer: Self-pay | Admitting: Family Medicine

## 2024-09-28 ENCOUNTER — Other Ambulatory Visit: Payer: Self-pay

## 2024-09-28 VITALS — BP 117/77 | HR 67 | Temp 97.5°F | Ht 63.0 in | Wt 184.2 lb

## 2024-09-28 DIAGNOSIS — E1169 Type 2 diabetes mellitus with other specified complication: Secondary | ICD-10-CM | POA: Diagnosis not present

## 2024-09-28 DIAGNOSIS — L659 Nonscarring hair loss, unspecified: Secondary | ICD-10-CM

## 2024-09-28 DIAGNOSIS — Z23 Encounter for immunization: Secondary | ICD-10-CM

## 2024-09-28 DIAGNOSIS — E890 Postprocedural hypothyroidism: Secondary | ICD-10-CM | POA: Diagnosis not present

## 2024-09-28 DIAGNOSIS — E1159 Type 2 diabetes mellitus with other circulatory complications: Secondary | ICD-10-CM | POA: Diagnosis not present

## 2024-09-28 DIAGNOSIS — E785 Hyperlipidemia, unspecified: Secondary | ICD-10-CM | POA: Diagnosis not present

## 2024-09-28 DIAGNOSIS — E119 Type 2 diabetes mellitus without complications: Secondary | ICD-10-CM | POA: Diagnosis not present

## 2024-09-28 DIAGNOSIS — I152 Hypertension secondary to endocrine disorders: Secondary | ICD-10-CM

## 2024-09-28 DIAGNOSIS — Z7985 Long-term (current) use of injectable non-insulin antidiabetic drugs: Secondary | ICD-10-CM

## 2024-09-28 LAB — BAYER DCA HB A1C WAIVED: HB A1C (BAYER DCA - WAIVED): 5.2 % (ref 4.8–5.6)

## 2024-09-28 NOTE — Progress Notes (Signed)
 "  Subjective: CC:DM PCP: Ann Ann HERO, DO YEP:Ann Peterson is a 59 y.o. female presenting to clinic today for:  Type 2 Diabetes with hypertension, hyperlipidemia:  Compliant with the Mounjaro  12.5 mg weekly.  Continues to have issues with hair loss.  Compliant with statin.  Reports no adverse side effects.  Continues to have high stress job.  Overall she had a good Christmas with her boyfriend.  ROS: No reports of chest pain, shortness of breath, visual disturbance or edema.   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   COVID-19 12/03/2019   body aches, joint pain, fever 104 x 4 days, then symptoms resolved   Diabetes mellitus without complication (HCC)    GERD (gastroesophageal reflux disease)    History of kidney stones    Hyperlipidemia    Hypertension    PONV (postoperative nausea and vomiting)    n/v after appendectomy 5-6 yrs ago   Sleep apnea    uses cpap   Thyroid  disease    thyroidectomy due to cancer   Current Medications[2] Social History   Socioeconomic History   Marital status: Widowed    Spouse name: Not on file   Number of children: Not on file   Years of education: Not on file   Highest education level: Associate degree: academic program  Occupational History   Not on file  Tobacco Use   Smoking status: Never   Smokeless tobacco: Never  Vaping Use   Vaping status: Never Used  Substance and Sexual Activity   Alcohol use: No   Drug use: No   Sexual activity: Not Currently    Birth control/protection: Surgical  Other Topics Concern   Not on file  Social History Narrative   Not on file   Social Drivers of Health   Tobacco Use: Low Risk (09/28/2024)   Patient History    Smoking Tobacco  Use: Never    Smokeless Tobacco Use: Never    Passive Exposure: Not on file  Financial Resource Strain: Low Risk (09/27/2024)   Overall Financial Resource Strain (CARDIA)    Difficulty of Paying Living Expenses: Not very hard  Food Insecurity: No Food Insecurity (09/27/2024)   Epic    Worried About Programme Researcher, Broadcasting/film/video in the Last Year: Never true    Ran Out of Food in the Last Year: Never true  Transportation Needs: No Transportation Needs (09/27/2024)   Epic    Lack of Transportation (Medical): No    Lack of Transportation (Non-Medical): No  Physical Activity: Inactive (09/27/2024)   Exercise Vital Sign    Days of Exercise per Week: 0 days    Minutes of Exercise per Session: Not on file  Stress: No Stress Concern Present (09/27/2024)   [employer] of Occupational Health - Occupational Stress Questionnaire    Feeling of Stress: Only a little  Social Connections: Moderately Integrated (09/27/2024)   Social Connection and Isolation Panel    Frequency of Communication with Friends and Family: More than three times a week    Frequency of Social Gatherings with Friends and Family: More than three times a week    Attends Religious Services: More than 4 times per year    Active Member  of Clubs or Organizations: Yes    Attends Banker Meetings: More than 4 times per year    Marital Status: Widowed  Intimate Partner Violence: Not At Risk (09/17/2023)   Humiliation, Afraid, Rape, and Kick questionnaire    Fear of Current or Ex-Partner: No    Emotionally Abused: No    Physically Abused: No    Sexually Abused: No  Depression (PHQ2-9): Low Risk (03/26/2024)   Depression (PHQ2-9)    PHQ-2 Score: 0  Alcohol Screen: Low Risk (09/17/2023)   Alcohol Screen    Last Alcohol Screening Score (AUDIT): 0  Housing: Low Risk (09/27/2024)   Epic    Unable to Pay for Housing in the Last Year: No    Number of Times Moved in the Last Year: 0    Homeless in the Last Year: No  Utilities: Not At  Risk (09/17/2023)   AHC Utilities    Threatened with loss of utilities: No  Health Literacy: Adequate Health Literacy (09/17/2023)   B1300 Health Literacy    Frequency of need for help with medical instructions: Never   Family History  Problem Relation Age of Onset   Hypertension Mother    Dementia Mother    Macular degeneration Mother    Stroke Mother    COPD Father        lung cancer   Colon polyps Sister    Colon polyps Sister    Colon polyps Brother    Colon polyps Brother    Colon polyps Brother    Colon polyps Brother    Colon polyps Brother    Brain cancer Brother    Colon polyps Brother    Colon cancer Brother    Rectal cancer Neg Hx    Stomach cancer Neg Hx    Esophageal cancer Neg Hx     Objective: Office vital signs reviewed. BP 117/77   Pulse 67   Temp (!) 97.5 F (36.4 C)   Ht 5' 3 (1.6 m)   Wt 184 lb 4 oz (83.6 kg)   LMP 09/07/2016   SpO2 95%   BMI 32.64 kg/m   Physical Examination:  General: Awake, alert, well nourished, No acute distress HEENT: sclera white, MMM Cardio: regular rate and rhythm, S1S2 heard, no murmurs appreciated Pulm: clear to auscultation bilaterally, no wheezes, rhonchi or rales; normal work of breathing on room air Extremities: warm, well perfused, No edema, cyanosis or clubbing; +2 pulses bilaterally    Diabetic Foot Exam - Simple   Simple Foot Form Diabetic Foot exam was performed with the following findings: Yes 09/28/2024  9:18 AM  Visual Inspection No deformities, no ulcerations, no other skin breakdown bilaterally: Yes Sensation Testing Intact to touch and monofilament testing bilaterally: Yes Pulse Check Posterior Tibialis and Dorsalis pulse intact bilaterally: Yes Comments     Lab Results  Component Value Date   HGBA1C 5.4 03/26/2024    Assessment/ Plan: 59 y.o. female   Diabetes mellitus treated with injections of non-insulin  medication (HCC) - Plan: Microalbumin / creatinine urine ratio, CMP14+EGFR,  Bayer DCA Hb A1c Waived  Hyperlipidemia associated with type 2 diabetes mellitus (HCC) - Plan: CMP14+EGFR, Lipid Panel  Hypertension associated with diabetes (HCC) - Plan: CMP14+EGFR  Postoperative hypothyroidism - Plan: CMP14+EGFR, TSH + free T4  Hair thinning  Check urine microalbumin, A1c, fasting labs.  Continue all medications as prescribed.  Offered advance dose of Mounjaro  to help meet weight loss goals but at this time she would like to hold  steady due to the hair thinning.  Discussed PRP as a possible option as she has exhausted many of the topicals, supplements etc..  Check thyroid  levels  Pneumococcal vaccination administered today.  Ann CHRISTELLA Fielding, DO [facility] [phone]     [1]  Allergies Allergen Reactions   Pollen Extract Other (See Comments)  [2]  Current Outpatient Medications:    albuterol  (VENTOLIN  HFA) 108 (90 Base) MCG/ACT inhaler, Inhale 2 puffs into the lungs every 6 (six) hours as needed for wheezing or shortness of breath., Disp: 8 g, Rfl: 2   amLODipine  (NORVASC ) 10 MG tablet, Take 1 tablet (10 mg total) by mouth daily., Disp: 90 tablet, Rfl: 4   blood glucose meter kit and supplies, Use up to four times daily as directed., Disp: 1 each, Rfl: 0   celecoxib  (CELEBREX ) 50 MG capsule, Take 1 capsule (50 mg total) by mouth 2 (two) times daily as needed for pain., Disp: 180 capsule, Rfl: PRN   Cholecalciferol (VITAMIN D ) 50 MCG (2000 UT) CAPS, Take 1 capsule by mouth every evening., Disp: , Rfl:    co-enzyme Q-10 30 MG capsule, Take 30 mg by mouth daily., Disp: , Rfl:    fluticasone  (FLONASE ) 50 MCG/ACT nasal spray, Place 2 sprays into both nostrils daily., Disp: 48 g, Rfl: 1   glucose blood test strip, Use as instructed to check blood sugar daily., Disp: 100 each, Rfl: PRN   indapamide  (LOZOL ) 2.5 MG tablet, TAKE 1 TABLET BY MOUTH EVERY DAY, Disp: 90 tablet, Rfl: 3   Lancets (FREESTYLE) lancets, Test blood glucose once daily  as directed., Disp: 100 each, Rfl: PRN   levothyroxine  (SYNTHROID ) 175 MCG tablet, Take 1 tablet (175 mcg total) by mouth daily., Disp: 90 tablet, Rfl: 3   levothyroxine  (SYNTHROID ) 175 MCG tablet, Take 1 tablet (175 mcg total) by mouth daily., Disp: 90 tablet, Rfl: 4   magnesium oxide (MAG-OX) 400 MG tablet, Take 400 mg by mouth daily., Disp: , Rfl:    metoprolol  succinate (TOPROL -XL) 100 MG 24 hr tablet, Take 1 tablet (100 mg total) by mouth daily. With or immediately following a meal, Disp: 90 tablet, Rfl: 4   ondansetron  (ZOFRAN -ODT) 4 MG disintegrating tablet, Take 1 tablet (4 mg total) by mouth every 8 (eight) hours as needed for nausea or vomiting., Disp: 20 tablet, Rfl: 1   rosuvastatin  (CRESTOR ) 20 MG tablet, Take 1 tablet (20 mg total) by mouth daily., Disp: 90 tablet, Rfl: 3   sucralfate  (CARAFATE ) 1 g tablet, Take 1 tablet (1 g total) by mouth 4 (four) times daily -  with meals and at bedtime., Disp: 120 tablet, Rfl: 0   tirzepatide  (MOUNJARO ) 12.5 MG/0.5ML Pen, Inject 12.5 mg into the skin once a week., Disp: 6 mL, Rfl: 4   BIOTIN PO, Take 1 capsule by mouth every evening. (Patient not taking: Reported on 09/28/2024), Disp: , Rfl:    ketoconazole  (NIZORAL ) 2 % shampoo, Lather affected areas and leave on for 10 mins. Then rinse.  Use twice weekly until rash resolves. (Patient not taking: Reported on 05/25/2024), Disp: 120 mL, Rfl: 2   lidocaine  (XYLOCAINE ) 2 % solution, Use as directed 15 mLs in the mouth or throat as needed., Disp: 100 mL, Rfl: 0   oxyCODONE -acetaminophen  (PERCOCET/ROXICET) 5-325 MG tablet, Take 1 tablet by mouth every 6 (six) hours as needed for severe pain (pain score 7-10). (Patient not taking: Reported on 07/29/2024), Disp: 15 tablet, Rfl: 0   predniSONE  (STERAPRED UNI-PAK 21 TAB)  10 MG (21) TBPK tablet, Day 1: 6 tablets (60 mg) Day 2: 5 tablets (50 mg) Day 3: 4 tablets (40 mg) Day 4: 3 tablets (30 mg) Day 5: 2 tablets (20 mg) Day 6: 1 tablet (10 mg) (Patient not taking:  Reported on 07/29/2024), Disp: 21 tablet, Rfl: 0   traZODone  (DESYREL ) 50 MG tablet, Take 0.5-1 tablets (25-50 mg total) by mouth at bedtime as needed for sleep (wean as tolerated). (Patient not taking: Reported on 07/29/2024), Disp: 90 tablet, Rfl: 0  "

## 2024-09-28 NOTE — Addendum Note (Signed)
 Addended by: SHERRE SUZEN PARAS on: 09/28/2024 09:26 AM   Modules accepted: Orders

## 2024-09-29 ENCOUNTER — Other Ambulatory Visit (HOSPITAL_COMMUNITY): Payer: Self-pay

## 2024-09-29 ENCOUNTER — Ambulatory Visit: Payer: Self-pay | Admitting: Family Medicine

## 2024-09-29 LAB — CMP14+EGFR
ALT: 28 IU/L (ref 0–32)
AST: 22 IU/L (ref 0–40)
Albumin: 4.7 g/dL (ref 3.8–4.9)
Alkaline Phosphatase: 85 IU/L (ref 49–135)
BUN/Creatinine Ratio: 25 — ABNORMAL HIGH (ref 9–23)
BUN: 17 mg/dL (ref 6–24)
Bilirubin Total: 0.4 mg/dL (ref 0.0–1.2)
CO2: 27 mmol/L (ref 20–29)
Calcium: 9.4 mg/dL (ref 8.7–10.2)
Chloride: 98 mmol/L (ref 96–106)
Creatinine, Ser: 0.67 mg/dL (ref 0.57–1.00)
Globulin, Total: 2.1 g/dL (ref 1.5–4.5)
Glucose: 96 mg/dL (ref 70–99)
Potassium: 3.7 mmol/L (ref 3.5–5.2)
Sodium: 142 mmol/L (ref 134–144)
Total Protein: 6.8 g/dL (ref 6.0–8.5)
eGFR: 102 mL/min/1.73

## 2024-09-29 LAB — LIPID PANEL
Chol/HDL Ratio: 2.7 ratio (ref 0.0–4.4)
Cholesterol, Total: 132 mg/dL (ref 100–199)
HDL: 49 mg/dL
LDL Chol Calc (NIH): 60 mg/dL (ref 0–99)
Triglycerides: 130 mg/dL (ref 0–149)
VLDL Cholesterol Cal: 23 mg/dL (ref 5–40)

## 2024-09-29 LAB — TSH+FREE T4
Free T4: 1.5 ng/dL (ref 0.82–1.77)
TSH: 4.64 u[IU]/mL — AB (ref 0.450–4.500)

## 2024-09-29 LAB — MICROALBUMIN / CREATININE URINE RATIO
Creatinine, Urine: 76.6 mg/dL
Microalb/Creat Ratio: 9 mg/g{creat} (ref 0–29)
Microalbumin, Urine: 6.9 ug/mL

## 2025-04-27 ENCOUNTER — Encounter: Admitting: Family Medicine
